# Patient Record
Sex: Male | Born: 1940
Health system: Southern US, Community
[De-identification: ages and names within clinical notes are randomized; demographics above are authoritative.]

## PROBLEM LIST (undated history)

## (undated) DIAGNOSIS — E785 Hyperlipidemia, unspecified: Secondary | ICD-10-CM

## (undated) DIAGNOSIS — R112 Nausea with vomiting, unspecified: Secondary | ICD-10-CM

## (undated) DIAGNOSIS — Z9889 Other specified postprocedural states: Secondary | ICD-10-CM

## (undated) DIAGNOSIS — T884XXA Failed or difficult intubation, initial encounter: Secondary | ICD-10-CM

## (undated) DIAGNOSIS — N189 Chronic kidney disease, unspecified: Secondary | ICD-10-CM

## (undated) DIAGNOSIS — Z8719 Personal history of other diseases of the digestive system: Secondary | ICD-10-CM

## (undated) DIAGNOSIS — C801 Malignant (primary) neoplasm, unspecified: Secondary | ICD-10-CM

## (undated) DIAGNOSIS — I519 Heart disease, unspecified: Secondary | ICD-10-CM

## (undated) DIAGNOSIS — K8010 Calculus of gallbladder with chronic cholecystitis without obstruction: Secondary | ICD-10-CM

## (undated) DIAGNOSIS — N201 Calculus of ureter: Secondary | ICD-10-CM

## (undated) DIAGNOSIS — R002 Palpitations: Secondary | ICD-10-CM

## (undated) DIAGNOSIS — K219 Gastro-esophageal reflux disease without esophagitis: Secondary | ICD-10-CM

## (undated) DIAGNOSIS — M1990 Unspecified osteoarthritis, unspecified site: Secondary | ICD-10-CM

## (undated) DIAGNOSIS — Z87442 Personal history of urinary calculi: Secondary | ICD-10-CM

## (undated) DIAGNOSIS — I4510 Unspecified right bundle-branch block: Secondary | ICD-10-CM

## (undated) HISTORY — DX: Hyperlipidemia, unspecified: E78.5

## (undated) HISTORY — DX: Unspecified osteoarthritis, unspecified site: M19.90

## (undated) HISTORY — DX: Gastro-esophageal reflux disease without esophagitis: K21.9

## (undated) HISTORY — DX: Palpitations: R00.2

## (undated) HISTORY — PX: CARDIOVASCULAR STRESS TEST: SHX262

---

## 1969-08-04 HISTORY — PX: TYMPANOPLASTY: SHX33

## 1998-10-01 ENCOUNTER — Encounter: Payer: Self-pay | Admitting: Unknown Physician Specialty

## 1998-10-01 ENCOUNTER — Ambulatory Visit (HOSPITAL_COMMUNITY): Admission: RE | Admit: 1998-10-01 | Discharge: 1998-10-01 | Payer: Self-pay | Admitting: Unknown Physician Specialty

## 1998-10-08 ENCOUNTER — Observation Stay (HOSPITAL_COMMUNITY): Admission: RE | Admit: 1998-10-08 | Discharge: 1998-10-09 | Payer: Self-pay | Admitting: Neurosurgery

## 1999-05-15 ENCOUNTER — Encounter: Admission: RE | Admit: 1999-05-15 | Discharge: 1999-05-15 | Payer: Self-pay | Admitting: Neurosurgery

## 1999-08-05 HISTORY — PX: ANTERIOR CERVICAL DECOMP/DISCECTOMY FUSION: SHX1161

## 2000-07-13 ENCOUNTER — Encounter (INDEPENDENT_AMBULATORY_CARE_PROVIDER_SITE_OTHER): Payer: Self-pay

## 2000-07-13 ENCOUNTER — Ambulatory Visit (HOSPITAL_COMMUNITY): Admission: RE | Admit: 2000-07-13 | Discharge: 2000-07-13 | Payer: Self-pay | Admitting: Gastroenterology

## 2000-12-02 ENCOUNTER — Encounter: Admission: RE | Admit: 2000-12-02 | Discharge: 2000-12-02 | Payer: Self-pay | Admitting: Neurosurgery

## 2003-12-26 ENCOUNTER — Emergency Department (HOSPITAL_COMMUNITY): Admission: EM | Admit: 2003-12-26 | Discharge: 2003-12-26 | Payer: Self-pay | Admitting: *Deleted

## 2004-05-27 ENCOUNTER — Ambulatory Visit (HOSPITAL_COMMUNITY): Admission: RE | Admit: 2004-05-27 | Discharge: 2004-05-27 | Payer: Self-pay | Admitting: Gastroenterology

## 2005-08-04 HISTORY — PX: SHOULDER ARTHROSCOPY: SHX128

## 2006-08-04 HISTORY — PX: KNEE ARTHROSCOPY: SUR90

## 2008-04-18 ENCOUNTER — Encounter: Admission: RE | Admit: 2008-04-18 | Discharge: 2008-05-25 | Payer: Self-pay | Admitting: Orthopedic Surgery

## 2008-08-07 HISTORY — PX: TRANSTHORACIC ECHOCARDIOGRAM: SHX275

## 2009-05-12 ENCOUNTER — Encounter: Admission: RE | Admit: 2009-05-12 | Discharge: 2009-05-12 | Payer: Self-pay | Admitting: Orthopedic Surgery

## 2010-12-13 ENCOUNTER — Encounter: Payer: Self-pay | Admitting: Nurse Practitioner

## 2010-12-13 DIAGNOSIS — K219 Gastro-esophageal reflux disease without esophagitis: Secondary | ICD-10-CM | POA: Insufficient documentation

## 2010-12-13 DIAGNOSIS — R002 Palpitations: Secondary | ICD-10-CM | POA: Insufficient documentation

## 2010-12-13 DIAGNOSIS — K30 Functional dyspepsia: Secondary | ICD-10-CM | POA: Insufficient documentation

## 2010-12-13 DIAGNOSIS — R0600 Dyspnea, unspecified: Secondary | ICD-10-CM | POA: Insufficient documentation

## 2010-12-16 ENCOUNTER — Encounter: Payer: Self-pay | Admitting: Nurse Practitioner

## 2010-12-16 ENCOUNTER — Ambulatory Visit (INDEPENDENT_AMBULATORY_CARE_PROVIDER_SITE_OTHER): Payer: Medicare Other | Admitting: Nurse Practitioner

## 2010-12-16 VITALS — BP 160/90 | HR 66 | Ht 71.0 in | Wt 213.2 lb

## 2010-12-16 DIAGNOSIS — I10 Essential (primary) hypertension: Secondary | ICD-10-CM

## 2010-12-16 DIAGNOSIS — R0609 Other forms of dyspnea: Secondary | ICD-10-CM

## 2010-12-16 DIAGNOSIS — R0600 Dyspnea, unspecified: Secondary | ICD-10-CM

## 2010-12-16 DIAGNOSIS — E785 Hyperlipidemia, unspecified: Secondary | ICD-10-CM | POA: Insufficient documentation

## 2010-12-16 DIAGNOSIS — I5189 Other ill-defined heart diseases: Secondary | ICD-10-CM

## 2010-12-16 DIAGNOSIS — R002 Palpitations: Secondary | ICD-10-CM

## 2010-12-16 DIAGNOSIS — I519 Heart disease, unspecified: Secondary | ICD-10-CM

## 2010-12-16 HISTORY — DX: Essential (primary) hypertension: I10

## 2010-12-16 NOTE — Assessment & Plan Note (Signed)
Seemingly resolved at the present time.

## 2010-12-16 NOTE — Assessment & Plan Note (Signed)
Blood pressure is up here today. He does not watch his salt and he had stopped checking his blood pressure at home. He is agreeable to monitoring at home and trying to cut back on the salt. I will see him back in one month to reassess.

## 2010-12-16 NOTE — Assessment & Plan Note (Signed)
LDL was 125 last June. He will have this rechecked at his physical next month. We may need to readdress the need for lipid lowering at that time. He is encouraged to work on his diet and weight.

## 2010-12-16 NOTE — Assessment & Plan Note (Signed)
He is totally asymptomatic with his Ranexa. I have encouraged him to continue the Ranexa. He is encouraged to work on his diet and weight loss.

## 2010-12-16 NOTE — Assessment & Plan Note (Signed)
Asymptomatic with the Ranexa. We will continue.

## 2010-12-16 NOTE — Patient Instructions (Addendum)
Stay on your current medicines. Monitor your blood pressure at home and try to cut back on your salt Try to exercise and focus on your weight. We will see you back in one month to relook at your blood pressure We will look at your cholesterol levels from your physical in June to reconsider cholesterol level.

## 2010-12-16 NOTE — Progress Notes (Signed)
   Max Fickle Date of Birth: [birth year]   History of Present Illness: Mr. Squier is seen today for a one year visit. He is seen for Dr. Deborah Chalk. He has had a good year overall from our standpoint. He denies chest pain. He has no shortness of breath with his Ranexa. He tried taking it only once a day but had to go to twice daily to achieve full resolution. He has no palpitations. He is mostly limited by his joints and reflux. He will be having his physical next month. It was suggested that he go on lipid lowering agents last year but he never did. His LDL was 125. He has not been checking his blood pressure at home. He does not watch his salt. He is not exercising.   Current Outpatient Prescriptions on File Prior to Visit  Medication Sig Dispense Refill  . Docusate Calcium (STOOL SOFTENER PO) Take by mouth as needed.        . hyoscyamine (LEVSIN, ANASPAZ) 0.125 MG tablet Take 0.25 mg by mouth every 6 (six) hours as needed.        . Multiple Vitamin (MULTIVITAMIN) tablet Take 1 tablet by mouth daily.        Marland Kitchen omeprazole (PRILOSEC) 20 MG capsule Take 20 mg by mouth daily.        . ranolazine (RANEXA) 500 MG 12 hr tablet Take 500 mg by mouth 2 (two) times daily.        Marland Kitchen DISCONTD: esomeprazole (NEXIUM) 40 MG capsule Take 40 mg by mouth daily before breakfast.          Allergies  Allergen Reactions  . Indocin     Past Medical History  Diagnosis Date  . Indigestion   . GERD (gastroesophageal reflux disease)   . Palpitations   . Dyspnea     Past Surgical History  Procedure Date  . Cardiolite study 12/2008    SHOWED AN EJECTION FRACTION OF 50-55% WITH LV FUNCTIONING NORMAL  . Shoulder surgery     RIGHT SHOULDER  . External ear surgery 1971    RIGHT EAR  . Knee arthroscopy 2004    RIGHT KNEE  . Lithotripsy     History  Smoking status  . Never Smoker   Smokeless tobacco  . Never Used    History  Alcohol Use No    Family History  Problem Relation Age of Onset  . Heart  failure Mother   . Aortic stenosis Mother   . Hypertension Father     Review of Systems: The review of systems is as above.  All other systems were reviewed and are negative.  Physical Exam: BP 170/94  Pulse 66  Ht 5\' 11"  (1.803 m)  Wt 213 lb 4 oz (96.73 kg)  BMI 29.74 kg/m2 Patient is very pleasant and in no acute distress. Skin is warm and dry. Color is normal.  HEENT is unremarkable. Normocephalic/atraumatic. PERRL. Sclera are nonicteric. Neck is supple. No masses. No JVD. Lungs are clear. Cardiac exam shows a regular rate and rhythm. No ectopics on exam. Abdomen is soft. Extremities are without edema. Gait and ROM are intact. No gross neurologic deficits noted.  LABORATORY DATA:  EKG shows sinus with PAC   Assessment / Plan:

## 2010-12-20 NOTE — Procedures (Signed)
[facility]  Patient:    Jake Barnett, Jake Barnett                           MRN: [id number] Proc. Date: 07/13/00 Attending:  Fayrene Fearing L. Randa Evens, M.D. CC:         Monica Becton, M.D., [employer], [place]   Procedure Report  DATE OF BIRTH:  1941-02-11.  PROCEDURE:  Colonoscopy and coagulation of polyps.  GASTROENTEROLOGIST:  Llana Aliment. Edwards, M.D.  MEDICATIONS:  Fentanyl 82.5 mcg, Versed 8 mg IV.  INDICATIONS:  Strong family history of colon cancer.  INSTRUMENT:  Adult Olympus video colonoscope.  DESCRIPTION OF PROCEDURE:  The procedure had been explained to the pat and consent obtained.  With the patient in the left lateral decubitus position, the adult video colonoscope was inserted and advanced under direct visualization.  The prep was excellent, and we were able to advance to the cecum without difficulty.  The ileocecal valve and appendiceal orifice was seen.  The scope with withdrawn, and the cecum and ascending colon were seen well upon removal.  In the mid ascending colon, two polyps, each 3 to 4 mm in diameter were seen and were cauterized and placed in a single jar. They were quite close to each other.  No other polyps were seen throughout the ascending colon, transverse colon, descending or sigmoid.  No significant diverticular disease.  The scope was withdrawn.  The rectum was free of polyps.  The patient tolerated the procedure well.  He was maintained on low-flow oxygen on pulse oximetry throughout the procedure with no obvious problem.  ASSESSMENT: Polyps in ascending colon cauterized.  PLAN:  Routine post colonoscopy instructions.  We recommend three-year repeat colonoscopy. DD:  07/13/00 TD:  07/13/00 Job: [id number]

## 2010-12-20 NOTE — Op Note (Signed)
Jake Barnett, Jake Barnett                  ACCOUNT NO.:  [id number]   MEDICAL RECORD NO.:  [id number]          PATIENT TYPE:  AMB   LOCATION:  ENDO                         FACILITY:  [facility]   PHYSICIAN:  James L. Malon Kindle., M.D.DATE OF BIRTH:  18-Jun-1941   DATE OF PROCEDURE:  05/27/2004  DATE OF DISCHARGE:                                 OPERATIVE REPORT   PROCEDURE:  Esophagogastroduodenoscopy.   MEDICATIONS GIVEN:  Fentanyl 80 mcg, Versed 7 mg IV, Xylocaine spray.   INDICATIONS FOR PROCEDURE:  Chronic esophageal reflux.   DESCRIPTION OF PROCEDURE:  The procedure was explained to the patient and  consent obtained.  With the patient in the left lateral decubitus position,  the Olympus scope was inserted and advanced.  The stomach was entered, the  pylorus identified and passed.  The duodenum including the bulb and second  portion were seen well.  There was no ulceration or inflammation.  The scope  was withdrawn back in the stomach.  The antrum and body were normal.  The  fundus and cardia were seen well on retroflex view and were normal.  The  scope was withdrawn.  The distal and proximal esophagus were endoscopically  normal.  There were no signs of Barrett's esophagus.  The Z-line was seen  about at the diaphragm.  The scope was withdrawn.  The patient tolerated the  procedure well.   ASSESSMENT:  Gastroesophageal reflux with no signs of Barrett's esophagus.   PLAN:  Will keep on current medications, give an anti-reflux sheet, and  proceed with colonoscopy at this time as planned.      JLE/MEDQ  D:  05/27/2004  T:  05/27/2004  Job:  [id number]   cc:   Gloriajean Dell. Andrey Campanile, M.D.  [address]  [place]  [place] [address]  Fax: [phone]

## 2010-12-20 NOTE — Op Note (Signed)
Jake Barnett, Jake Barnett                  ACCOUNT NO.:  [id number]   MEDICAL RECORD NO.:  [id number]          PATIENT TYPE:  AMB   LOCATION:  ENDO                         FACILITY[facility]   PHYSICIAN:  James L. Malon Kindle., M.D.DATE OF BIRTH:  1940/08/20   DATE OF PROCEDURE:  05/27/2004  DATE OF DISCHARGE:                                 OPERATIVE REPORT   PROCEDURE:  Colonoscopy.   MEDICATIONS:  Fentanyl 100 mcg, Versed 10 mg IV.   SCOPE:  Olympus pediatric colonoscope.   INDICATIONS FOR PROCEDURE:  The patient's had a previous history of polyps  and is overdue for a followup colonoscopy.  He does have a strong family  history of colon cancer as well.   DESCRIPTION OF PROCEDURE:  The procedure had been explained to the patient  and consent obtained. With the patient in the left lateral decubitus  position, the Olympus scope was inserted. The prep was excellent. We were  able to advance to the cecum without difficulty. The ileocecal valve and  appendiceal orifice were seen. The scope was withdrawn to the cecum,  ascending colon, hepatic flexure, transverse colon, splenic flexure,  descending and sigmoid colon were seen well except for scattered  diverticula.  In the sigmoid colon no polyps were seen throughout.  The  rectum was free was free of polyps. The scope was withdrawn.  The patient  tolerated the procedure well.   ASSESSMENT:  Previous history of colon polyps, V12.72.   PLAN:  Would recommend repeating colonoscopy in five years and recommend  yearly Hemoccults.      JLE/MEDQ  D:  05/27/2004  T:  05/27/2004  Job:  [id number]   cc:   Gloriajean Dell. Andrey Campanile, M.D.  [address]  [place]  [place] [address]  Fax: [phone]

## 2011-01-11 ENCOUNTER — Other Ambulatory Visit: Payer: Self-pay | Admitting: Cardiology

## 2011-01-13 ENCOUNTER — Other Ambulatory Visit: Payer: Self-pay | Admitting: *Deleted

## 2011-01-13 MED ORDER — RANOLAZINE ER 500 MG PO TB12: 500.0000 mg | ORAL_TABLET | Freq: Two times a day (BID) | ORAL | Status: DC

## 2011-01-13 NOTE — Telephone Encounter (Signed)
Fax received from pharmacy. Refill completed. Jodette Emy Angevine RN  

## 2011-01-31 ENCOUNTER — Ambulatory Visit (INDEPENDENT_AMBULATORY_CARE_PROVIDER_SITE_OTHER): Payer: Medicare Other | Admitting: Nurse Practitioner

## 2011-01-31 ENCOUNTER — Encounter: Payer: Self-pay | Admitting: Nurse Practitioner

## 2011-01-31 DIAGNOSIS — E785 Hyperlipidemia, unspecified: Secondary | ICD-10-CM

## 2011-01-31 DIAGNOSIS — I10 Essential (primary) hypertension: Secondary | ICD-10-CM

## 2011-01-31 DIAGNOSIS — R079 Chest pain, unspecified: Secondary | ICD-10-CM

## 2011-01-31 NOTE — Assessment & Plan Note (Signed)
Labs are checked by his PCP.

## 2011-01-31 NOTE — Assessment & Plan Note (Signed)
His readings show good control at home. He will continue to monitor.

## 2011-01-31 NOTE — Patient Instructions (Signed)
We are going to repeat your stress test. Continue to monitor your blood pressure at home. I will have you see Dr. Jens Som in 6 months. Call for any problems.

## 2011-01-31 NOTE — Assessment & Plan Note (Addendum)
His last stress echo was in 2010 and showed diastolic dysfunction. He has multiple risk factors. He needs to exercise but is scared to embark on a program due to that episode of chest pain. We will arrange for stress testing. We will tentatively see him back in 6 months, sooner if the stress test is abnormal. Patient is agreeable to this plan and will call if any problems develop in the interim.

## 2011-01-31 NOTE — Progress Notes (Signed)
    Jake Barnett Date of Birth: 03-05-1941   History of Present Illness: Jake Barnett is seen back today for a one month visit. He is seen for Jake Barnett. He is a former patient of Jake Barnett and a former patient of Jake Barnett. His blood pressure was up last month. He wanted a trial of exercise and salt reduction. He went home and at first says he was motivated to exercise but when he tried to get on the treadmill and walk, he felt some chest discomfort, dizziness, palpitations and got scared. He has not tried since then. His blood pressure is better at home. His cuff correlates. He does have multiple cardiovascular risk factors. He was placed on Ranexa by Jake Barnett in the remote past for his palpitations and diastolic dysfunction.   Current Outpatient Prescriptions on File Prior to Visit  Medication Sig Dispense Refill  . Multiple Vitamin (MULTIVITAMIN) tablet Take 1 tablet by mouth daily.        Marland Kitchen omeprazole (PRILOSEC) 20 MG capsule Take 20 mg by mouth daily.        . ranolazine (RANEXA) 500 MG 12 hr tablet Take 1 tablet (500 mg total) by mouth 2 (two) times daily.  60 tablet  5  . DISCONTD: Docusate Calcium (STOOL SOFTENER PO) Take by mouth as needed.        Marland Kitchen DISCONTD: hyoscyamine (LEVSIN, ANASPAZ) 0.125 MG tablet Take 0.25 mg by mouth every 6 (six) hours as needed.          Allergies  Allergen Reactions  . Indocin     Past Medical History  Diagnosis Date  . GERD (gastroesophageal reflux disease)   . Palpitations   . Dyspnea   . White coat hypertension   . Hyperlipidemia   . Diastolic dysfunction     Noted per stress echo in 2010    Past Surgical History  Procedure Date  . Cardiolite study 12/2008    SHOWED AN EJECTION FRACTION OF 50-55% WITH LV FUNCTIONING NORMAL  . Shoulder surgery     RIGHT SHOULDER  . External ear surgery 1971    RIGHT EAR  . Knee arthroscopy 2004    RIGHT KNEE  . Lithotripsy     History  Smoking status  . Never Smoker   Smokeless tobacco  .  Never Used    History  Alcohol Use No    Family History  Problem Relation Age of Onset  . Heart failure Mother   . Aortic stenosis Mother   . Hypertension Father     Review of Systems: The review of systems is positive for palpitations and an episode of chest discomfort. He has erectile dysfunction.  All other systems were reviewed and are negative.  Physical Exam: BP 138/88  Pulse 70  Wt 210 lb (95.255 kg) Blood pressure by me is 160/90. His machine is 155/93. Diary from home shows satisfactory control.  Patient is very pleasant and in no acute distress. Skin is warm and dry. Color is normal.  HEENT is unremarkable. Normocephalic/atraumatic. PERRL. Sclera are nonicteric. Neck is supple. No masses. No JVD. Lungs are clear. Cardiac exam shows a regular rate and rhythm. Abdomen is soft. Extremities are without edema. Gait and ROM are intact. No gross neurologic deficits noted.  LABORATORY DATA:   Assessment / Plan:

## 2011-02-11 ENCOUNTER — Ambulatory Visit (HOSPITAL_COMMUNITY): Payer: Medicare Other | Attending: Cardiology | Admitting: Radiology

## 2011-02-11 DIAGNOSIS — R0602 Shortness of breath: Secondary | ICD-10-CM

## 2011-02-11 DIAGNOSIS — R0789 Other chest pain: Secondary | ICD-10-CM

## 2011-02-11 DIAGNOSIS — I4949 Other premature depolarization: Secondary | ICD-10-CM

## 2011-02-11 DIAGNOSIS — R079 Chest pain, unspecified: Secondary | ICD-10-CM | POA: Insufficient documentation

## 2011-02-11 MED ORDER — TECHNETIUM TC 99M TETROFOSMIN IV KIT
33.0000 | PACK | Freq: Once | INTRAVENOUS | Status: AC | PRN
  Administered 2011-02-11: 33 via INTRAVENOUS

## 2011-02-11 MED ORDER — TECHNETIUM TC 99M TETROFOSMIN IV KIT
11.0000 | PACK | Freq: Once | INTRAVENOUS | Status: AC | PRN
  Administered 2011-02-11: 11 via INTRAVENOUS

## 2011-02-11 NOTE — Progress Notes (Signed)
[facility] SITE 3 NUCLEAR MED [address] [place] [address] [phone]  Cardiology Nuclear Med Study  Jake Barnett is a 70 y.o. male [id number] Nov 18, 1940   Nuclear Med Background Indication for Stress Test:  Evaluation for Ischemia History: 10 Stress Echo:EF=50-55%;normal with diastolic dysfunction Cardiac Risk Factors: Hypertension and Lipids(borderline)  Symptoms:  Chest Pressure(constant), Chest Tightness ( constant), Diaphoresis, Dizziness, DOE, Fatigue, Light-Headedness and Palpitations   Nuclear Pre-Procedure Caffeine/Decaff Intake:  None NPO After: 9:00pm   Lungs:  clear IV 0.9% NS with Angio Cath:  18g  IV Site: R Antecubital  IV Started by:  Stanton Kidney, EMT-P  Chest Size (in):  44 Cup Size: n/a  Height: 5\' 11"  (1.803 m)  Weight:  204 lb (92.534 kg)  BMI:  Body mass index is 28.45 kg/(m^2). Tech Comments:  NA    Nuclear Med Study 1 or 2 day study: 1 day  Stress Test Type:  Stress  Reading MD: Cassell Clement, MD  Order Authorizing Provider:  Ripley Fraise  Resting Radionuclide: Technetium 60m Tetrofosmin  Resting Radionuclide Dose: 11.0 mCi   Stress Radionuclide:  Technetium 72m Tetrofosmin  Stress Radionuclide Dose: 33.0 mCi           Stress Protocol Rest HR: 75 Stress HR: 169  Rest BP: 111/77 Stress BP: 190/79  Exercise Time (min): 9:31 METS: 10.9   Predicted Max HR: 151 bpm % Max HR: 111.92 bpm Rate Pressure Product: 21308   Dose of Adenosine (mg):  n/a Dose of Lexiscan: n/a mg  Dose of Atropine (mg): n/a Dose of Dobutamine: n/a mcg/kg/min (at max HR)  Stress Test Technologist: Cathlyn Parsons, RN  Nuclear Technologist:  Domenic Polite, CNMT     Rest Procedure:  Myocardial perfusion imaging was performed at rest 45 minutes following the intravenous administration of Technetium 35m Tetrofosmin. Rest ECG: NSR  Stress Procedure:  The patient exercised for 9:31.  The patient stopped due to fatigue and moderate dyspnea  and denied any chest pain.  There were no significant ST-T wave changes. Patient had occasional PVC's.Technetium 64m Tetrofosmin was injected at peak exercise and myocardial perfusion imaging was performed after a brief delay. Stress ECG: No significant ST segment change suggestive of ischemia.  QPS Raw Data Images:  Mild diaphragmatic attenuation.  Normal left ventricular size. Stress Images:  Normal homogeneous uptake in all areas of the myocardium. Rest Images:  Normal homogeneous uptake in all areas of the myocardium. Subtraction (SDS):  No evidence of ischemia. Transient Ischemic Dilatation (Normal <1.22):  0.88 Lung/Heart Ratio (Normal <0.45):  0.21  Quantitative Gated Spect Images QGS EDV:  97 ml QGS ESV:  33 ml QGS cine images:  NL LV Function; NL Wall Motion QGS EF: 66%  Impression Exercise Capacity:  Good exercise capacity. BP Response:  Normal blood pressure response. Clinical Symptoms:  No chest pain. ECG Impression:  No significant ST segment change suggestive of ischemia. Comparison with Prior Nuclear Study: No previous nuclear study performed  Overall Impression:  Normal stress nuclear study.   Cassell Clement

## 2011-02-12 NOTE — Progress Notes (Signed)
nuc med report routed to Dr.Crenshaw 02/12/11 Janijah Symons   

## 2011-02-14 ENCOUNTER — Telehealth: Payer: Self-pay | Admitting: Cardiology

## 2011-02-14 NOTE — Telephone Encounter (Signed)
Left message of normal myoview results on pt answering machine Jake Barnett

## 2011-02-14 NOTE — Progress Notes (Signed)
pt aware of results Jake Barnett  

## 2011-02-14 NOTE — Telephone Encounter (Signed)
Pt returning call back to [place].

## 2011-02-14 NOTE — Telephone Encounter (Signed)
Pt returning call to [facility] regarding test results. This is the second time pt has called today. Please return pt call to advise. Pt will be outside today until about 2 pm, please call pt after that time.

## 2011-05-15 ENCOUNTER — Ambulatory Visit (INDEPENDENT_AMBULATORY_CARE_PROVIDER_SITE_OTHER): Payer: Medicare Other | Admitting: General Surgery

## 2011-05-15 ENCOUNTER — Encounter (INDEPENDENT_AMBULATORY_CARE_PROVIDER_SITE_OTHER): Payer: Self-pay | Admitting: General Surgery

## 2011-05-15 VITALS — BP 134/84 | HR 64 | Temp 97.4°F | Resp 16 | Ht 71.0 in | Wt 200.5 lb

## 2011-05-15 DIAGNOSIS — N50812 Left testicular pain: Secondary | ICD-10-CM

## 2011-05-15 DIAGNOSIS — N509 Disorder of male genital organs, unspecified: Secondary | ICD-10-CM

## 2011-05-15 NOTE — Patient Instructions (Signed)
The cause of the pain in your left testicle is not clear. I do not find a hernia on exam today. This could be due to the kidney stones that you  have. It could be due to musculoskeletal problems. I do not feel an abnormality of the testicle. Since the pain only occurs once every few weeks, I do not think anything further needs to be done at this time. If the pain in your left testicle recurs or becomes more persistent you should see your urologist. If you develop a bulge in either groin you should return to see me for an exam to see if a hernia has developed. Otherwise see me as needed.

## 2011-05-15 NOTE — Progress Notes (Signed)
Chief Complaint  Patient presents with  . Other    new pt eval of LIH     HPI Jake Barnett is a 70 y.o. male.    This pleasant gentleman is from [facility]. His wife is a breast cancer patient of mine. He is referred by Dr. Vernon Barnett at [facility] for left testicular pain, possible hernia.  Approximately 10 months ago he began developing some lower abdominal discomfort from time to time. He has been seeing Jake Barnett who has tried different medications and has done a colonoscopy and upper endoscopy. Every few weeks he will have some left testicular pain and he saw Jake Barnett. He thought there might be a left inguinal hernia.  On further questioning the patient states to the left testicular pain is aching discomfort. The testicle was not tender. There has been no trauma to the testicle. This is very intermittent and he will go several weeks without it. He has known kidney stones and is followed by Dr. Retta Barnett. He does complain of urinary frequency. He has had kidney stones in the past. He does not seen blood measure and an apparently a recent urinalysis was normal and a recent PSA was normal.  He is asymptomatic today.  HPI  Past Medical History  Diagnosis Date  . GERD (gastroesophageal reflux disease)   . Palpitations   . Dyspnea   . Hyperlipidemia   . Diastolic dysfunction     Noted per stress echo in 2010  . Arthritis     Past Surgical History  Procedure Date  . Cardiolite study 12/2008    SHOWED AN EJECTION FRACTION OF 50-55% WITH LV FUNCTIONING NORMAL  . Shoulder surgery     RIGHT SHOULDER  . External ear surgery 1971    RIGHT EAR  . Knee arthroscopy 2004    RIGHT KNEE  . Lithotripsy     Family History  Problem Relation Age of Onset  . Heart failure Mother   . Aortic stenosis Mother   . Heart disease Mother   . Hypertension Father     Social History History  Substance Use Topics  . Smoking status: Never Smoker   .  Smokeless tobacco: Never Used  . Alcohol Use: No    Allergies  Allergen Reactions  . Indocin     Current Outpatient Prescriptions  Medication Sig Dispense Refill  . Multiple Vitamin (MULTIVITAMIN) tablet Take 1 tablet by mouth daily.        Marland Kitchen omeprazole (PRILOSEC) 20 MG capsule Take 20 mg by mouth daily.        . ranolazine (RANEXA) 500 MG 12 hr tablet Take 1 tablet (500 mg total) by mouth 2 (two) times daily.  60 tablet  5    Review of Systems Review of Systems  Constitutional: Negative.   HENT: Negative.   Eyes: Negative.   Respiratory: Negative.   Cardiovascular: Negative.   Gastrointestinal: Positive for abdominal pain. Negative for nausea, vomiting, diarrhea, constipation, blood in stool, abdominal distention, anal bleeding and rectal pain.  Genitourinary: Positive for frequency, difficulty urinating and testicular pain. Negative for dysuria, hematuria, flank pain, decreased urine volume, discharge, penile swelling, scrotal swelling, enuresis, genital sores and penile pain.  Musculoskeletal: Negative.   Skin: Negative.   Neurological: Negative.   Hematological: Negative.   Psychiatric/Behavioral: Negative.     Blood pressure 134/84, pulse 64, temperature 97.4 F (36.3 C), resp. rate 16, height 5\' 11"  (1.803 m), weight 200 lb 8  oz (90.946 kg).  Physical Exam Physical Exam  Constitutional: He is oriented to person, place, and time. He appears well-developed and well-nourished.  HENT:  Head: Normocephalic and atraumatic.  Nose: Nose normal.  Mouth/Throat: No oropharyngeal exudate.  Eyes: Conjunctivae are normal. Pupils are equal, round, and reactive to light. Right eye exhibits no discharge. Left eye exhibits no discharge. No scleral icterus.  Neck: Normal range of motion. Neck supple. No JVD present. No tracheal deviation present. No thyromegaly present.  Cardiovascular: Normal rate, normal heart sounds and intact distal pulses.   No murmur heard. Pulmonary/Chest:  Effort normal and breath sounds normal. No respiratory distress. He has no wheezes. He has no rales. He exhibits no tenderness.  Abdominal: Soft. Bowel sounds are normal. He exhibits no distension and no mass. There is no tenderness. There is no rebound and no guarding.  Genitourinary: Penis normal.    No penile tenderness.  Musculoskeletal: Normal range of motion. He exhibits no edema and no tenderness.  Lymphadenopathy:    He has no cervical adenopathy.  Neurological: He is alert and oriented to person, place, and time. He exhibits normal muscle tone. Coordination normal.  Skin: Skin is warm and dry. No rash noted. He is not diaphoretic. No erythema. No pallor.  Psychiatric: He has a normal mood and affect. His behavior is normal. Judgment and thought content normal.    Data Reviewed I reviewed an office note from Jake Barnett's office.  Assessment    Left testicular pain of uncertain etiology. There is no evidence for left inguinal hernia today. This may be musculoskeletal or urologic in etiology. He is asymptomatic and his pain is very intermittent. I suspect a benign etiology.  Possible early right inguinal hernia, asymptomatic, no intervention indicated.  History kidney stones.  Urinary frequency. Suspect benign prostatic hypertrophy.  Gastroesophageal reflux disease.  Hyperlipidemia  Diastolic dysfunction followed  Dr. Olga Barnett.  Status post multiple surgeries including cervical spine, right knee, and right shoulder.    Plan    The patient was reassured that I do not detect an inguinal hernia, at least at this time. He is aware that he may develop a hernia in the future.  I told him that if the left testicular pain recurred and persisted that he should probably have a urologic evaluation.  Return to see me p.r.n.       Jake Barnett M 05/15/2011, 10:14 AM

## 2011-05-26 ENCOUNTER — Ambulatory Visit (HOSPITAL_COMMUNITY): Admission: RE | Admit: 2011-05-26 | Payer: Medicare Other | Source: Ambulatory Visit | Admitting: Urology

## 2011-05-26 ENCOUNTER — Ambulatory Visit (HOSPITAL_BASED_OUTPATIENT_CLINIC_OR_DEPARTMENT_OTHER)
Admission: RE | Admit: 2011-05-26 | Discharge: 2011-05-26 | Disposition: A | Discharge status: Home / Self Care | Payer: Medicare Other | Source: Ambulatory Visit | Attending: Urology | Admitting: Urology

## 2011-05-26 DIAGNOSIS — Z01812 Encounter for preprocedural laboratory examination: Secondary | ICD-10-CM | POA: Insufficient documentation

## 2011-05-26 DIAGNOSIS — K219 Gastro-esophageal reflux disease without esophagitis: Secondary | ICD-10-CM | POA: Insufficient documentation

## 2011-05-26 DIAGNOSIS — Z01810 Encounter for preprocedural cardiovascular examination: Secondary | ICD-10-CM | POA: Insufficient documentation

## 2011-05-26 DIAGNOSIS — N201 Calculus of ureter: Secondary | ICD-10-CM | POA: Insufficient documentation

## 2011-05-26 HISTORY — PX: OTHER SURGICAL HISTORY: SHX169

## 2011-05-26 LAB — POCT I-STAT 4, (NA,K, GLUC, HGB,HCT)
Glucose, Bld: 99 mg/dL (ref 70–99)
HCT: 39 % (ref 39.0–52.0)
Hemoglobin: 13.3 g/dL (ref 13.0–17.0)
Sodium: 141 mEq/L (ref 135–145)

## 2011-05-29 NOTE — Op Note (Signed)
NAMEFINES, KIMBERLIN NO.:  [id number]  MEDICAL RECORD NO.:  [id number]  LOCATION:                                 FACILITY:  PHYSICIAN:  Valetta Fuller, MD    DATE OF BIRTH:  Dec 11, 1940  DATE OF PROCEDURE:  05/26/2011 DATE OF DISCHARGE:                              OPERATIVE REPORT   PREOPERATIVE DIAGNOSIS:  Bilateral ureteral calculi.  POSTOPERATIVE DIAGNOSIS:  Bilateral ureteral calculi.  PROCEDURE PERFORMED:  Cystoscopy, bilateral retrograde pyelograms, bilateral ureteroscopy rigid and flexible, bilateral holmium laser lithotripsy with stone basketing of fragments, and bilateral double-J stent placement.  SURGEON:  Valetta Fuller, MD.  ANESTHESIA:  General.  INDICATIONS:  Mr. Basilio is a 70 years of age.  He has a history of nephrolithiasis and recently presented with typical right renal colic. CT demonstrated a 6 mm right mid ureteral stone causing mild obstruction.  There was also bilateral nephrolithiasis.  There was also a suspicion of a distal ureteral calculus.  We felt that given the bilateral ureteral stones, the patient would be better off having endoscopic approach versus lithotripsy and the rationale for that as well as risks and benefits were discussed with him.  He presents now for the procedure.  The patient has received perioperative antibiotics and placement of PAS compression boots.  TECHNIQUE AND FINDINGS:  The patient was brought to the operating room where he had successful induction of general anesthesia.  He was placed in lithotomy position and prepped and draped in the usual manner. Appropriate surgical time-out was performed.  Cystoscopy revealed mild- to-moderate trilobar hyperplasia with a fairly prominent median bar. The bladder was otherwise, endoscopically unremarkable.  Right retrograde pyelogram was performed.  The patient had some tortuosity of his ureter and a filling defect at the junction of the mid and  distal ureter on the right, consistent with a 6 mm stone causing partial obstruction.  A guidewire was passed beyond the stone.  The ureteral orifice appeared quite snug and for that reason, the inside portion of an access sheath was used to provide one-step dilation.  The distal ureter on the right was then engaged with the 6-French rigid ureteroscope.  At that point, we performed rigid ureteroscopy up to the renal pelvis and did not immediately see the stone.  We thought that potentially it migrated proximally during retrograde pyelography.  For that reason, I placed an access sheath and then placed the digital flexible ureteroscope up through the access sheath.  An obvious stone in the renal pelvis was not encountered.  With the safety wire in place, we slowly withdrew the flexible ureteroscope and sheath and then did indeed find the stone in the mid ureter.  Holmium laser lithotriptor was then used to break the stone into multiple fragments.  The largest pieces were basket extracted.  Over the guidewire, we placed a 6-French 24 cm double-J stent with fluoroscopic as well as visual guidance.  Attention was then turned to the left side.  We did a retrograde pyelogram again with fluoroscopic interpretation and indeed.  This suspicious calcification was seen on CT, was confirmed to be in the distal ureter and appeared  to be about 5 mm in size.  A guidewire was placed beyond the stone to the renal pelvis.  Again, some dilation was necessary in the intramural ureter utilizing the inside portion of an access sheath.  The rigid ureteroscope was then engaged and an stone was encountered several centimeters above the ureterovesical junction.  Again, the stone was quite hard and dense and thought to be potentially calcium oxalate monohydrate.  Holmium laser lithotriptor was used to break the stone in the 6 to 8 pieces, which again were basket extracted.  A double-J stent, 6-French 24 cm in  length was then placed.  Fragments were removed and will be sent for stone analysis.  No obvious complications occurred and the patient was brought to recovery room in stable condition.     Valetta Fuller, MD     DSG/MEDQ  D:  05/26/2011  T:  05/27/2011  Job:  [id number]  Electronically Signed by Barron Alvine M.D. on 05/29/2011 08:50:44 AM

## 2011-06-24 ENCOUNTER — Encounter (INDEPENDENT_AMBULATORY_CARE_PROVIDER_SITE_OTHER): Payer: Self-pay | Admitting: Physician Assistant

## 2011-06-25 ENCOUNTER — Encounter (INDEPENDENT_AMBULATORY_CARE_PROVIDER_SITE_OTHER): Payer: Self-pay | Admitting: Physician Assistant

## 2011-07-05 ENCOUNTER — Other Ambulatory Visit: Payer: Self-pay | Admitting: Cardiology

## 2011-07-22 ENCOUNTER — Ambulatory Visit (INDEPENDENT_AMBULATORY_CARE_PROVIDER_SITE_OTHER): Payer: Medicare Other | Admitting: Cardiology

## 2011-07-22 ENCOUNTER — Encounter: Payer: Self-pay | Admitting: Cardiology

## 2011-07-22 DIAGNOSIS — R002 Palpitations: Secondary | ICD-10-CM

## 2011-07-22 DIAGNOSIS — I5189 Other ill-defined heart diseases: Secondary | ICD-10-CM

## 2011-07-22 DIAGNOSIS — I519 Heart disease, unspecified: Secondary | ICD-10-CM

## 2011-07-22 DIAGNOSIS — K30 Functional dyspepsia: Secondary | ICD-10-CM

## 2011-07-22 DIAGNOSIS — I10 Essential (primary) hypertension: Secondary | ICD-10-CM

## 2011-07-22 DIAGNOSIS — K3189 Other diseases of stomach and duodenum: Secondary | ICD-10-CM

## 2011-07-22 NOTE — Assessment & Plan Note (Signed)
No symptoms at present

## 2011-07-22 NOTE — Patient Instructions (Signed)
Your physician wants you to follow-up in: ONE YEAR You will receive a reminder letter in the mail two months in advance. If you don't receive a letter, please call our office to schedule the follow-up appointment.   STOP RANEXA

## 2011-07-22 NOTE — Assessment & Plan Note (Signed)
Appears to be reasonably well controlled. Ranexa would not be my first line of therapy for this problem. Will dc and if symptoms recur, add low dose beta blocker.

## 2011-07-22 NOTE — Assessment & Plan Note (Signed)
Chest pain most consistent with reflux. Previous Myoview negative. No further evaluation at this time.

## 2011-07-22 NOTE — Assessment & Plan Note (Signed)
Blood pressure is controlled on no medications. 

## 2011-07-22 NOTE — Progress Notes (Signed)
   RUE:[id number] male previously followed by Dr Deborah Chalk for fu of palpitations and chest pain. Patient is on ranexa for palpitations and dyspnea. Previous monitor in 2009 showed sinus with occasional PVC and PAC and short nonsustained run of atrial tachycardia (4 beats). Stress echocardiogram in May of 2010 showed diastolic dysfunction but no ECG changes and no wall motion abnormalities. Patient had a Myoview in July of 2012 and showed an ejection fraction of 66% and normal perfusion. Since he was last seen he has dyspnea with more extreme activities. No orthopnea, PND, pedal edema or syncope. Occasional brief skipped but no sustained palpitations. No exertional chest pain. Occasional indigestion relieved with belching.  Current Outpatient Prescriptions  Medication Sig Dispense Refill  . Multiple Vitamin (MULTIVITAMIN) tablet Take 1 tablet by mouth daily.        Marland Kitchen omeprazole (PRILOSEC) 20 MG capsule Take 20 mg by mouth daily.        Marland Kitchen RANEXA 500 MG 12 hr tablet TAKE ONE TABLET BY MOUTH TWICE A DAY  60 tablet  3     Past Medical History  Diagnosis Date  . GERD (gastroesophageal reflux disease)   . Palpitations   . Dyspnea   . Hyperlipidemia   . Diastolic dysfunction     Noted per stress echo in 2010  . Arthritis     Past Surgical History  Procedure Date  . Cardiolite study 12/2008    SHOWED AN EJECTION FRACTION OF 50-55% WITH LV FUNCTIONING NORMAL  . Shoulder surgery     RIGHT SHOULDER  . External ear surgery 1971    RIGHT EAR  . Knee arthroscopy 2004    RIGHT KNEE  . Lithotripsy     History   Social History  . Marital Status: Married    Spouse Name: N/A    Number of Children: N/A  . Years of Education: N/A   Occupational History  . Not on file.   Social History Main Topics  . Smoking status: Never Smoker   . Smokeless tobacco: Never Used  . Alcohol Use: No  . Drug Use: No  . Sexually Active: Yes   Other Topics Concern  . Not on file   Social History Narrative  .  No narrative on file    ROS: no fevers or chills, productive cough, hemoptysis, dysphasia, odynophagia, melena, hematochezia, dysuria, hematuria, rash, seizure activity, orthopnea, PND, pedal edema, claudication. Remaining systems are negative.  Physical Exam: Well-developed well-nourished in no acute distress.  Skin is warm and dry.  HEENT is normal.  Neck is supple. No thyromegaly.  Chest is clear to auscultation with normal expansion.  Cardiovascular exam is regular rate and rhythm.  Abdominal exam nontender or distended. No masses palpated. Extremities show no edema. neuro grossly intact  ECG 05/26/11 Sinus with incomplete RBBB, LAE

## 2011-12-15 DIAGNOSIS — M1710 Unilateral primary osteoarthritis, unspecified knee: Secondary | ICD-10-CM | POA: Diagnosis not present

## 2011-12-18 DIAGNOSIS — M109 Gout, unspecified: Secondary | ICD-10-CM | POA: Diagnosis not present

## 2011-12-18 DIAGNOSIS — M779 Enthesopathy, unspecified: Secondary | ICD-10-CM | POA: Diagnosis not present

## 2011-12-18 DIAGNOSIS — M79609 Pain in unspecified limb: Secondary | ICD-10-CM | POA: Diagnosis not present

## 2012-01-01 DIAGNOSIS — N200 Calculus of kidney: Secondary | ICD-10-CM | POA: Diagnosis not present

## 2012-02-19 DIAGNOSIS — K219 Gastro-esophageal reflux disease without esophagitis: Secondary | ICD-10-CM | POA: Diagnosis not present

## 2012-03-08 DIAGNOSIS — M1710 Unilateral primary osteoarthritis, unspecified knee: Secondary | ICD-10-CM | POA: Diagnosis not present

## 2012-05-17 DIAGNOSIS — L821 Other seborrheic keratosis: Secondary | ICD-10-CM | POA: Diagnosis not present

## 2012-05-17 DIAGNOSIS — L570 Actinic keratosis: Secondary | ICD-10-CM | POA: Diagnosis not present

## 2012-06-25 DIAGNOSIS — M1710 Unilateral primary osteoarthritis, unspecified knee: Secondary | ICD-10-CM | POA: Diagnosis not present

## 2012-06-25 DIAGNOSIS — M25569 Pain in unspecified knee: Secondary | ICD-10-CM | POA: Diagnosis not present

## 2012-06-28 DIAGNOSIS — Z23 Encounter for immunization: Secondary | ICD-10-CM | POA: Diagnosis not present

## 2012-07-08 DIAGNOSIS — N529 Male erectile dysfunction, unspecified: Secondary | ICD-10-CM | POA: Diagnosis not present

## 2012-07-08 DIAGNOSIS — N400 Enlarged prostate without lower urinary tract symptoms: Secondary | ICD-10-CM | POA: Diagnosis not present

## 2012-07-08 DIAGNOSIS — N200 Calculus of kidney: Secondary | ICD-10-CM | POA: Diagnosis not present

## 2012-09-02 ENCOUNTER — Ambulatory Visit (INDEPENDENT_AMBULATORY_CARE_PROVIDER_SITE_OTHER): Payer: Medicare Other | Admitting: Nurse Practitioner

## 2012-09-02 ENCOUNTER — Encounter: Payer: Self-pay | Admitting: Nurse Practitioner

## 2012-09-02 VITALS — BP 144/86 | HR 67 | Ht 71.0 in | Wt 211.4 lb

## 2012-09-02 DIAGNOSIS — R002 Palpitations: Secondary | ICD-10-CM | POA: Diagnosis not present

## 2012-09-02 DIAGNOSIS — E785 Hyperlipidemia, unspecified: Secondary | ICD-10-CM

## 2012-09-02 DIAGNOSIS — I10 Essential (primary) hypertension: Secondary | ICD-10-CM | POA: Diagnosis not present

## 2012-09-02 LAB — HEPATIC FUNCTION PANEL
AST: 20 U/L (ref 0–37)
Albumin: 4.1 g/dL (ref 3.5–5.2)
Alkaline Phosphatase: 67 U/L (ref 39–117)
Total Protein: 6.7 g/dL (ref 6.0–8.3)

## 2012-09-02 LAB — LIPID PANEL
Cholesterol: 187 mg/dL (ref 0–200)
LDL Cholesterol: 121 mg/dL — ABNORMAL HIGH (ref 0–99)
Triglycerides: 118 mg/dL (ref 0.0–149.0)

## 2012-09-02 NOTE — Progress Notes (Signed)
Patient Name: Jake Barnett Date of Encounter: 09/02/2012  Primary Care Provider:  Rudi Heap, MD Primary Cardiologist:  B. Jens Som, MD  Patient Profile  72 year old male with history of palpitations present followup.  Problem List   Past Medical History  Diagnosis Date  . GERD (gastroesophageal reflux disease)   . Palpitations     a. monitoring 2009 - occasional PAC/PVC.  Marland Kitchen Dyspnea   . Hyperlipidemia   . Diastolic dysfunction     Noted per stress echo in 2010  . Arthritis   . Chest pain     a. 12/2008 nl stress echo.;  b. 02/2011 nl myoview, EF 66%.   Past Surgical History  Procedure Date  . Cardiolite study 12/2008    SHOWED AN EJECTION FRACTION OF 50-55% WITH LV FUNCTIONING NORMAL  . Shoulder surgery     RIGHT SHOULDER  . External ear surgery 1971    RIGHT EAR  . Knee arthroscopy 2004    RIGHT KNEE  . Lithotripsy     Allergies  Allergies  Allergen Reactions  . Indomethacin     HPI  72 year old male with a problem list.  His history of palpitations with monitoring 2009 revealing occasional PACs and PVCs.  He had previously been treated with ranexa however this was discontinued in December of 2012 due to lack of indication.  The patient hasn't been seen since then but reports that he's been doing well over the past year with only occasional palpitations but nothing that is so bothersome that he did want to go back on medicine for.  He is not particularly active secondary to chronic left knee pain.  At that time he experiences no chest pain or dyspnea with usual activities.  His biggest complaint is a history of GERD and upper GI gas which is followed closely by gastroenterology.  Home Medications  Prior to Admission medications   Medication Sig Start Date End Date Taking? Authorizing Provider  Multiple Vitamin (MULTIVITAMIN) tablet Take 1 tablet by mouth daily.     Yes Historical Provider, MD  omeprazole (PRILOSEC) 20 MG capsule Take 20 mg by mouth daily.      Yes Historical Provider, MD    Review of Systems  As above, he's been doing well from a cardiac standpoint.  He does have reflux and occasional bloating.  He has occasional palpitations but feels that these are stable.  All other systems reviewed and are otherwise negative except as noted above.  Physical Exam  Blood pressure 144/86, pulse 67, height 5\' 11"  (1.803 m), weight 211 lb 6.4 oz (95.89 kg).  General: Pleasant, NAD Psych: Normal affect. Neuro: Alert and oriented X 3. Moves all extremities spontaneously. HEENT: Normal  Neck: Supple without bruits or JVD. Lungs:  Resp regular and unlabored, CTA. Heart: RRR no s3, s4, or murmurs. Abdomen: Soft, non-tender, non-distended, BS + x 4.  Extremities: No clubbing, cyanosis or edema. DP/PT/Radials 2+ and equal bilaterally.  Accessory Clinical Findings  ECG - rate is sinus rhythm, 67, no acute ST or T changes.  Her  Assessment & Plan  1.  Palpitations: Patient has occasional palpitations but is not particularly symptomatic and does not wish to initiate a beta blocker.  He is asked if we can check a thyroid profile has increased and sometimes does have one.  2.  Lipids: Patient is fasting today and has asked if we can check a fasting lipid profile hasn't been some time since he's had one.  3.  Disposition:  Followup with Dr. Jens Som in one year.    Nicolasa Ducking, NP 09/02/2012, 10:34 AM

## 2012-09-02 NOTE — Patient Instructions (Addendum)
Your physician recommends that you return for lab work in: today (Lipid, liver)  Your physician recommends that you continue on your current medications as directed. Please refer to the Current Medication list given to you today.  Your physician wants you to follow-up in: 1 year.   You will receive a reminder letter in the mail two months in advance. If you don't receive a letter, please call our office to schedule the follow-up appointment.

## 2012-09-03 ENCOUNTER — Encounter: Payer: Self-pay | Admitting: *Deleted

## 2012-09-10 ENCOUNTER — Telehealth: Payer: Self-pay | Admitting: Cardiology

## 2012-09-10 NOTE — Telephone Encounter (Signed)
Message placed in error.  Please disregard and delete.

## 2012-09-10 NOTE — Telephone Encounter (Signed)
New Problem:     Patient called in needing updates for all of her medications to last her until her next OV with Dr. Daleen Squibb.  Please call back if you have any questions.

## 2012-11-18 DIAGNOSIS — M1710 Unilateral primary osteoarthritis, unspecified knee: Secondary | ICD-10-CM | POA: Diagnosis not present

## 2012-12-08 DIAGNOSIS — M1710 Unilateral primary osteoarthritis, unspecified knee: Secondary | ICD-10-CM | POA: Diagnosis not present

## 2012-12-15 DIAGNOSIS — M1710 Unilateral primary osteoarthritis, unspecified knee: Secondary | ICD-10-CM | POA: Diagnosis not present

## 2012-12-22 DIAGNOSIS — M1710 Unilateral primary osteoarthritis, unspecified knee: Secondary | ICD-10-CM | POA: Diagnosis not present

## 2013-01-06 DIAGNOSIS — N200 Calculus of kidney: Secondary | ICD-10-CM | POA: Diagnosis not present

## 2013-01-06 DIAGNOSIS — N400 Enlarged prostate without lower urinary tract symptoms: Secondary | ICD-10-CM | POA: Diagnosis not present

## 2013-02-02 DIAGNOSIS — M1710 Unilateral primary osteoarthritis, unspecified knee: Secondary | ICD-10-CM | POA: Diagnosis not present

## 2013-02-21 DIAGNOSIS — K219 Gastro-esophageal reflux disease without esophagitis: Secondary | ICD-10-CM | POA: Diagnosis not present

## 2013-03-08 ENCOUNTER — Other Ambulatory Visit: Payer: Self-pay | Admitting: *Deleted

## 2013-03-08 DIAGNOSIS — Z01818 Encounter for other preprocedural examination: Secondary | ICD-10-CM

## 2013-03-21 ENCOUNTER — Telehealth: Payer: Self-pay | Admitting: Cardiology

## 2013-03-21 NOTE — Telephone Encounter (Signed)
Spoke with pt, aware he has an appt to get clearance 04-05-13 with lori gerhardt np. Pt voiced understanding

## 2013-03-21 NOTE — Telephone Encounter (Signed)
Pt calling re surgical clearance status

## 2013-03-23 ENCOUNTER — Encounter: Payer: Self-pay | Admitting: *Deleted

## 2013-03-29 DIAGNOSIS — M1710 Unilateral primary osteoarthritis, unspecified knee: Secondary | ICD-10-CM | POA: Diagnosis not present

## 2013-04-05 ENCOUNTER — Other Ambulatory Visit: Payer: Self-pay | Admitting: Orthopedic Surgery

## 2013-04-05 ENCOUNTER — Ambulatory Visit (INDEPENDENT_AMBULATORY_CARE_PROVIDER_SITE_OTHER): Payer: Medicare Other | Admitting: Nurse Practitioner

## 2013-04-05 ENCOUNTER — Encounter: Payer: Self-pay | Admitting: Nurse Practitioner

## 2013-04-05 VITALS — BP 142/76 | HR 68 | Ht 71.0 in | Wt 210.8 lb

## 2013-04-05 DIAGNOSIS — R079 Chest pain, unspecified: Secondary | ICD-10-CM | POA: Diagnosis not present

## 2013-04-05 DIAGNOSIS — Z01818 Encounter for other preprocedural examination: Secondary | ICD-10-CM

## 2013-04-05 NOTE — Progress Notes (Signed)
Jake Barnett Date of Birth: Jan 28, 1941 Medical Record #[id number]  History of Present Illness: Jake Barnett is seen back today for a pre op visit. Seen for Dr. Jens Som. Former patient of Dr. Ronnald Nian. Has palpitations, GERD, dyspnea, HLD, diastolic dysfunction noted per stress echo back in 2010, OA and a normal Myoview back in July of 2010 with EF 66% and no ischemia.   Last seen here in January for palpitations. Has had remote monitoring showing occasional PACs and PVCs. Previously treated with Ranexa but stopped due to lack of indication - but it did improve his dyspnea.   Comes in today. Here alone. Doing ok except for knee pain - no longer able to golf and not as active as he would like to be. No chest pain. Not short of breath. Not dizzy or lightheaded. No passing out spells. Not really having any palpitations. He is planning on left TKR in 2 weeks. BP diary from home shows excellent control.    Current Outpatient Prescriptions  Medication Sig Dispense Refill  . docusate sodium (COLACE) 50 MG capsule Take by mouth as needed for constipation.      . naproxen sodium (ANAPROX) 220 MG tablet Take 220 mg by mouth as needed.      . ranitidine (ZANTAC) 150 MG tablet Take 150 mg by mouth as needed for heartburn.       No current facility-administered medications for this visit.    Allergies  Allergen Reactions  . Indomethacin     Past Medical History  Diagnosis Date  . GERD (gastroesophageal reflux disease)   . Palpitations     a. monitoring 2009 - occasional PAC/PVC.  Marland Kitchen Dyspnea   . Hyperlipidemia   . Diastolic dysfunction     Noted per stress echo in 2010  . Arthritis   . Chest pain     a. 12/2008 nl stress echo.;  b. 02/2011 nl myoview, EF 66%.    Past Surgical History  Procedure Laterality Date  . Cardiolite study  12/2008    SHOWED AN EJECTION FRACTION OF 50-55% WITH LV FUNCTIONING NORMAL  . Shoulder surgery      RIGHT SHOULDER  . External ear surgery  1971    RIGHT EAR  .  Knee arthroscopy  2004    RIGHT KNEE  . Lithotripsy      History  Smoking status  . Never Smoker   Smokeless tobacco  . Never Used    History  Alcohol Use No    Family History  Problem Relation Age of Onset  . Heart failure Mother   . Aortic stenosis Mother   . Heart disease Mother   . Hypertension Father     Review of Systems: The review of systems is per the HPI.  All other systems were reviewed and are negative.  Physical Exam: BP 142/76  Pulse 68  Ht 5\' 11"  (1.803 m)  Wt 210 lb 12.8 oz (95.618 kg)  BMI 29.41 kg/m2 Patient is very pleasant and in no acute distress. Skin is warm and dry. Color is normal.  HEENT is unremarkable. Normocephalic/atraumatic. PERRL. Sclera are nonicteric. Neck is supple. No masses. No JVD. Lungs are clear. Cardiac exam shows a regular rate and rhythm. Abdomen is soft. Extremities are without edema. Gait and ROM are intact. No gross neurologic deficits noted.  LABORATORY DATA: EKG shows sinus with incomplete RBBB.   Lab Results  Component Value Date   HGB 13.3 05/26/2011   HCT 39.0 05/26/2011  GLUCOSE 99 05/26/2011   CHOL 187 09/02/2012   TRIG 118.0 09/02/2012   HDL 42.50 09/02/2012   LDLCALC 121* 09/02/2012   ALT 13 09/02/2012   AST 20 09/02/2012   NA 141 05/26/2011   K 4.0 05/26/2011   Assessment / Plan: 1. Pre op clearance - currently totally asymptomatic. Normal Myoview from 2012. I think he is an acceptable candidate for his knee surgery. Will be available if problems arise.   2. HTN - has excellent control at home.  3. Diastolic dysfunction - asymptomatic.   Will see him back at his regular time in January with Dr. Jens Som.   Patient is agreeable to this plan and will call if any problems develop in the interim.   Rosalio Macadamia, RN, ANP-C [facility] [address] [place], [place]  [address]

## 2013-04-05 NOTE — Patient Instructions (Addendum)
I think you are an acceptable candidate for your knee replacement  We will see you back as planned in January  Call the [facility] office at [phone] if you have any questions, problems or concerns.

## 2013-04-06 ENCOUNTER — Encounter (HOSPITAL_COMMUNITY): Payer: Self-pay | Admitting: Pharmacy Technician

## 2013-04-11 ENCOUNTER — Other Ambulatory Visit (HOSPITAL_COMMUNITY): Payer: Self-pay | Admitting: *Deleted

## 2013-04-12 ENCOUNTER — Encounter (HOSPITAL_COMMUNITY): Payer: Self-pay

## 2013-04-12 ENCOUNTER — Encounter (HOSPITAL_COMMUNITY)
Admission: RE | Admit: 2013-04-12 | Discharge: 2013-04-12 | Disposition: A | Discharge status: Home / Self Care | Payer: Medicare Other | Source: Ambulatory Visit | Attending: Orthopedic Surgery | Admitting: Orthopedic Surgery

## 2013-04-12 DIAGNOSIS — Z01812 Encounter for preprocedural laboratory examination: Secondary | ICD-10-CM | POA: Insufficient documentation

## 2013-04-12 DIAGNOSIS — M1710 Unilateral primary osteoarthritis, unspecified knee: Secondary | ICD-10-CM | POA: Insufficient documentation

## 2013-04-12 HISTORY — DX: Personal history of urinary calculi: Z87.442

## 2013-04-12 HISTORY — DX: Personal history of other diseases of the digestive system: Z87.19

## 2013-04-12 LAB — COMPREHENSIVE METABOLIC PANEL
ALT: 8 U/L (ref 0–53)
Albumin: 3.8 g/dL (ref 3.5–5.2)
Alkaline Phosphatase: 82 U/L (ref 39–117)
BUN: 15 mg/dL (ref 6–23)
Chloride: 103 mEq/L (ref 96–112)
Glucose, Bld: 91 mg/dL (ref 70–99)
Potassium: 4.3 mEq/L (ref 3.5–5.1)
Sodium: 139 mEq/L (ref 135–145)
Total Bilirubin: 1 mg/dL (ref 0.3–1.2)

## 2013-04-12 LAB — URINALYSIS, ROUTINE W REFLEX MICROSCOPIC
Bilirubin Urine: NEGATIVE
Glucose, UA: NEGATIVE mg/dL
Hgb urine dipstick: NEGATIVE
Specific Gravity, Urine: 1.02 (ref 1.005–1.030)
Urobilinogen, UA: 0.2 mg/dL (ref 0.0–1.0)

## 2013-04-12 LAB — CBC
HCT: 45.4 % (ref 39.0–52.0)
Hemoglobin: 15.7 g/dL (ref 13.0–17.0)
RDW: 13.4 % (ref 11.5–15.5)
WBC: 7.3 10*3/uL (ref 4.0–10.5)

## 2013-04-12 LAB — PROTIME-INR: Prothrombin Time: 12.5 seconds (ref 11.6–15.2)

## 2013-04-12 LAB — APTT: aPTT: 26 seconds (ref 24–37)

## 2013-04-12 NOTE — Patient Instructions (Signed)
20      Your procedure is scheduled on:  Monday 04/18/2013  Report to [facility] Stay Center at  0940 AM.  Call this number if you have problems the night before or morning of surgery: [phone]   Remember:             IF YOU USE CPAP,BRING MASK AND TUBING AM OF SURGERY!   Do not eat food or drink liquids AFTER MIDNIGHT!  Take these medicines the morning of surgery with A SIP OF WATER: NONE   Do not bring valuables to the hospital. [facility] IS NOT RESPONSIBLE FOR ANY BELONGINGS OR VALUABLES BROUGHT TO HOSPITAL.  Marland Kitchen  Leave suitcase in the car. After surgery it may be brought to your room.  For patients admitted to the hospital, checkout time is 11:00 AM the day of              Discharge.    DO NOT WEAR JEWELRY , MAKE-UP, LOTIONS,POWDERS,PERFUMES!             WOMEN -DO NOT SHAVE LEGS OR UNDERARMS 12 HRS. BEFORE SURGERY!               MEN MAY SHAVE AS USUAL!             CONTACTS,DENTURES OR BRIDGEWORK, FALSE EYELASHES MAY NOT BE WORN INTO SURGERY!                                           Patients discharged the day of surgery will not be allowed to drive home. If going home the same day of surgery, must have someone stay with you first 24 hrs.at home and arrange for someone to drive you home from the Hospital.                          YOUR DRIVER IS: spouse-Saundra   Special Instructions:             Please read over the following fact sheets that you were given:             1. [place] PREPARING FOR SURGERY SHEET              2.INCENTIVE SPIROMETRY                    3. MRSA INFORMATION                                    Telford Nab.Jilleen Essner,RN,BSN     [phone]                FAILURE TO FOLLOW THESE INSTRUCTIONS MAY RESULT IN  CANCELLATION OF YOUR SURGERY!               Patient Signature:___________________________

## 2013-04-17 ENCOUNTER — Other Ambulatory Visit: Payer: Self-pay | Admitting: Orthopedic Surgery

## 2013-04-17 NOTE — H&P (Signed)
Jake Barnett  DOB: September 11, 1940 Married / Language: [place] / Race: White Male  Date of Admission:  04/18/2013  Chief Complaint:  Left Knee Pain  History of Present Illness The patient is a 72 year old male who comes in for a preoperative History and Physical. The patient is scheduled for a left total knee arthroplasty to be performed by Dr. Gus Rankin. Aluisio, MD at [facility] on 04/18/2013. The patient is a 72 year old male who presents for a recheck of Follow-up Knee. The patient is being followed for their left knee pain and osteoarthritis. They are now week(s) out from synvisc series (helped very little). Symptoms reported today include: pain (on medial side of knee with any weight. Pain increases with increase in activity.), pain at night, swelling and stiffness (after sitting), while the patient does not report symptoms of: instability. The patient feels that they are doing well and report their pain level to be mild to moderate and 3 / 10. The following medication has been used for pain control: none. The patient has not gotten any relief of their symptoms with viscosupplementation.  Unfortunately his left knee is getting progressively worse. Cortisone and visco supplements have not provided any benefit. He continues to have pain throughout the day as well as sometimes at night. It is hard for him to get into a comfortable position. He frequently has to sleep with a pillow between his legs. The knee is now limiting what he can and can not do. He would like to be more active but the knee is preventing him from doing so.  He is now ready for surgery. They have been treated conservatively in the past for the above stated problem and despite conservative measures, they continue to have progressive pain and severe functional limitations and dysfunction. They have failed non-operative management including home exercise, medications. It is felt that they would benefit from undergoing  total joint replacement. Risks and benefits of the procedure have been discussed with the patient and they elect to proceed with surgery. There are no active contraindications to surgery such as ongoing infection or rapidly progressive neurological disease.  Problem List Primary osteoarthritis of both knees (715.16)   Allergies Indocin *ANALGESICS - ANTI-INFLAMMATORY*   Family History Diabetes Mellitus. grandmother fathers side Rheumatoid Arthritis. mother and father Cancer. grandfather fathers side Hypertension. mother and father Congestive Heart Failure. mother   Social History Living situation. live with spouse Current work status. retired Marital status. married Children. 1 Pain Contract. no Tobacco / smoke exposure. no Alcohol use. never consumed alcohol Illicit drug use. no Tobacco use. never smoker Number of flights of stairs before winded. 4-5 Drug/Alcohol Rehab (Previously). no Drug/Alcohol Rehab (Currently). no Exercise. Exercises rarely; does running / walking   Past Surgical History Neck Disc Surgery Spinal Fusion. neck Colon Polyp Removal - Colonoscopy Arthroscopy of Knee. right Arthroscopy of Shoulder. right   Medical History Skin Cancer Gastroesophageal Reflux Disease Kidney Stone Gout Ulcer disease Hiatal Hernia Hemorrhoids Scarlet Fever Measles Mumps   Review of Systems General:Present- Fatigue. Not Present- Chills, Fever, Night Sweats, Weight Gain, Weight Loss and Memory Loss. Skin:Not Present- Hives, Itching, Rash, Eczema and Lesions. HEENT:Not Present- Tinnitus, Headache, Double Vision, Visual Loss, Hearing Loss and Dentures. Respiratory:Not Present- Shortness of breath with exertion, Shortness of breath at rest, Allergies, Coughing up blood and Chronic Cough. Cardiovascular:Not Present- Chest Pain, Racing/skipping heartbeats, Difficulty Breathing Lying Down, Murmur, Swelling and  Palpitations. Gastrointestinal:Present- Heartburn. Not Present- Bloody Stool, Abdominal Pain, Vomiting,  Nausea, Constipation, Diarrhea, Difficulty Swallowing, Jaundice and Loss of appetitie. Male Genitourinary:Not Present- Urinary frequency, Blood in Urine, Weak urinary stream, Discharge, Flank Pain, Incontinence, Painful Urination, Urgency, Urinary Retention and Urinating at Night. Musculoskeletal:Present- Joint Pain. Not Present- Muscle Weakness, Muscle Pain, Joint Swelling, Back Pain, Morning Stiffness and Spasms. Neurological:Not Present- Tremor, Dizziness, Blackout spells, Paralysis, Difficulty with balance and Weakness. Psychiatric:Not Present- Insomnia.   Vitals Weight: 210 lb Height: 71 in Weight was reported by patient. Height was reported by patient. Body Surface Area: 2.18 m Body Mass Index: 29.29 kg/m Pulse: 64 (Regular) Resp.: 14 (Unlabored) BP: 138/78 (Sitting, Right Arm, Standard)    Physical Exam The physical exam findings are as follows:   General Mental Status - Alert, cooperative and good historian. General Appearance- pleasant. Not in acute distress. Orientation- Oriented X3. Build & Nutrition- Well nourished and Well developed.   Head and Neck Head- normocephalic, atraumatic . Neck Global Assessment- supple. no bruit auscultated on the right and no bruit auscultated on the left.   Eye Vision- Wears corrective lenses. Pupil- Bilateral- Regular and Round. Motion- Bilateral- EOMI.   Chest and Lung Exam Auscultation: Breath sounds:- clear at anterior chest wall and - clear at posterior chest wall. Adventitious sounds:- No Adventitious sounds.   Cardiovascular Auscultation:Rhythm- Regular rate and rhythm. Heart Sounds- S1 WNL and S2 WNL. Murmurs & Other Heart Sounds:Auscultation of the heart reveals - No Murmurs.   Abdomen Palpation/Percussion:Tenderness- Abdomen is non-tender to palpation. Rigidity  (guarding)- Abdomen is soft. Auscultation:Auscultation of the abdomen reveals - Bowel sounds normal.   Male Genitourinary Not done, not pertinent to present illness  Musculoskeletal On exam he is alert and oriented in no apparent distress. His hips show normal range of motion, no discomfort. His left knee shows slight varus deformity. There is no effusion. Range is about 10 to 125. There is marked crepitus on range of motion. He is tender medial greater than lateral with no instability.  RADIOGRAPHS: Review of previous x-rays show bone on bone arthritis in the medial compartment and patellofemoral compartment of the left knee.  Assessment & Plan Primary osteoarthritis of both knees (715.16)  Note: Plan is for a Left Total Knee Replacement by Dr. Lequita Halt.  Plan is to go home with this wife.  PCP - [facility] - Dr. Jens Som  The patient does not have any contraindications and will recieve TXA (tranexamic acid) prior to surgery.  Time spent ~ 35 minutes  Signed electronically by Lauraine Rinne, III PA-C

## 2013-04-18 ENCOUNTER — Encounter (HOSPITAL_COMMUNITY): Payer: Self-pay | Admitting: Anesthesiology

## 2013-04-18 ENCOUNTER — Inpatient Hospital Stay (HOSPITAL_COMMUNITY): Payer: Medicare Other | Admitting: Anesthesiology

## 2013-04-18 ENCOUNTER — Encounter (HOSPITAL_COMMUNITY)
Admission: RE | Disposition: A | Discharge status: Home Health | Payer: Self-pay | Source: Ambulatory Visit | Attending: Orthopedic Surgery

## 2013-04-18 ENCOUNTER — Encounter (HOSPITAL_COMMUNITY): Payer: Self-pay | Admitting: *Deleted

## 2013-04-18 ENCOUNTER — Inpatient Hospital Stay (HOSPITAL_COMMUNITY)
Admission: RE | Admit: 2013-04-18 | Discharge: 2013-04-20 | DRG: 470 | Disposition: A | Discharge status: Home Health | Payer: Medicare Other | Source: Ambulatory Visit | Attending: Orthopedic Surgery | Admitting: Orthopedic Surgery

## 2013-04-18 DIAGNOSIS — Z85828 Personal history of other malignant neoplasm of skin: Secondary | ICD-10-CM

## 2013-04-18 DIAGNOSIS — M25569 Pain in unspecified knee: Secondary | ICD-10-CM | POA: Diagnosis not present

## 2013-04-18 DIAGNOSIS — M1710 Unilateral primary osteoarthritis, unspecified knee: Secondary | ICD-10-CM | POA: Diagnosis not present

## 2013-04-18 DIAGNOSIS — D62 Acute posthemorrhagic anemia: Secondary | ICD-10-CM

## 2013-04-18 DIAGNOSIS — K219 Gastro-esophageal reflux disease without esophagitis: Secondary | ICD-10-CM

## 2013-04-18 DIAGNOSIS — Z8261 Family history of arthritis: Secondary | ICD-10-CM | POA: Diagnosis not present

## 2013-04-18 DIAGNOSIS — Z981 Arthrodesis status: Secondary | ICD-10-CM

## 2013-04-18 DIAGNOSIS — M109 Gout, unspecified: Secondary | ICD-10-CM | POA: Diagnosis not present

## 2013-04-18 DIAGNOSIS — Z96652 Presence of left artificial knee joint: Secondary | ICD-10-CM

## 2013-04-18 DIAGNOSIS — M179 Osteoarthritis of knee, unspecified: Secondary | ICD-10-CM

## 2013-04-18 DIAGNOSIS — Z01812 Encounter for preprocedural laboratory examination: Secondary | ICD-10-CM

## 2013-04-18 DIAGNOSIS — IMO0002 Reserved for concepts with insufficient information to code with codable children: Secondary | ICD-10-CM | POA: Diagnosis not present

## 2013-04-18 HISTORY — PX: TOTAL KNEE ARTHROPLASTY: SHX125

## 2013-04-18 LAB — TYPE AND SCREEN
ABO/RH(D): O POS
Antibody Screen: NEGATIVE

## 2013-04-18 LAB — ABO/RH: ABO/RH(D): O POS

## 2013-04-18 SURGERY — ARTHROPLASTY, KNEE, TOTAL
Anesthesia: Spinal | Site: Knee | Laterality: Left | Wound class: Clean

## 2013-04-18 MED ORDER — FAMOTIDINE 20 MG PO TABS
20.0000 mg | ORAL_TABLET | Freq: Every day | ORAL | Status: DC | PRN
  Administered 2013-04-18: 20 mg via ORAL
  Filled 2013-04-18 (×2): qty 1

## 2013-04-18 MED ORDER — ONDANSETRON HCL 4 MG/2ML IJ SOLN
INTRAMUSCULAR | Status: DC | PRN
  Administered 2013-04-18: 4 mg via INTRAVENOUS

## 2013-04-18 MED ORDER — LACTATED RINGERS IV SOLN
INTRAVENOUS | Status: DC
  Administered 2013-04-18: 1000 mL via INTRAVENOUS
  Administered 2013-04-18: 13:00:00 via INTRAVENOUS

## 2013-04-18 MED ORDER — METHOCARBAMOL 100 MG/ML IJ SOLN
500.0000 mg | Freq: Four times a day (QID) | INTRAMUSCULAR | Status: DC | PRN
  Administered 2013-04-18: 16:00:00 500 mg via INTRAVENOUS
  Filled 2013-04-18: qty 5

## 2013-04-18 MED ORDER — BUPIVACAINE IN DEXTROSE 0.75-8.25 % IT SOLN
INTRATHECAL | Status: DC | PRN
  Administered 2013-04-18: 15 mg via INTRATHECAL

## 2013-04-18 MED ORDER — ACETAMINOPHEN 500 MG PO TABS
1000.0000 mg | ORAL_TABLET | Freq: Four times a day (QID) | ORAL | Status: AC
  Administered 2013-04-18: 1000 mg via ORAL
  Filled 2013-04-18: qty 2

## 2013-04-18 MED ORDER — DOCUSATE SODIUM 100 MG PO CAPS
100.0000 mg | ORAL_CAPSULE | Freq: Two times a day (BID) | ORAL | Status: DC
  Administered 2013-04-18 – 2013-04-20 (×3): 100 mg via ORAL

## 2013-04-18 MED ORDER — CEFAZOLIN SODIUM-DEXTROSE 2-3 GM-% IV SOLR
INTRAVENOUS | Status: AC
  Filled 2013-04-18: qty 50

## 2013-04-18 MED ORDER — MEPERIDINE HCL 50 MG/ML IJ SOLN: 6.2500 mg | INTRAMUSCULAR | Status: DC | PRN

## 2013-04-18 MED ORDER — POLYETHYLENE GLYCOL 3350 17 G PO PACK: 17.0000 g | PACK | Freq: Every day | ORAL | Status: DC | PRN

## 2013-04-18 MED ORDER — FENTANYL CITRATE 0.05 MG/ML IJ SOLN
INTRAMUSCULAR | Status: AC
  Filled 2013-04-18: qty 2

## 2013-04-18 MED ORDER — CEFAZOLIN SODIUM-DEXTROSE 2-3 GM-% IV SOLR
2.0000 g | INTRAVENOUS | Status: AC
  Administered 2013-04-18: 2 g via INTRAVENOUS

## 2013-04-18 MED ORDER — RIVAROXABAN 10 MG PO TABS
10.0000 mg | ORAL_TABLET | Freq: Every day | ORAL | Status: DC
  Administered 2013-04-19 – 2013-04-20 (×2): 10 mg via ORAL
  Filled 2013-04-18 (×4): qty 1

## 2013-04-18 MED ORDER — DEXAMETHASONE 6 MG PO TABS
10.0000 mg | ORAL_TABLET | Freq: Every day | ORAL | Status: AC
  Administered 2013-04-19: 09:00:00 10 mg via ORAL
  Filled 2013-04-18: qty 1

## 2013-04-18 MED ORDER — KETOROLAC TROMETHAMINE 15 MG/ML IJ SOLN: 7.5000 mg | Freq: Four times a day (QID) | INTRAMUSCULAR | Status: AC | PRN

## 2013-04-18 MED ORDER — METOCLOPRAMIDE HCL 5 MG/ML IJ SOLN
5.0000 mg | Freq: Three times a day (TID) | INTRAMUSCULAR | Status: DC | PRN
  Administered 2013-04-19: 08:00:00 10 mg via INTRAVENOUS
  Filled 2013-04-18: qty 2

## 2013-04-18 MED ORDER — MORPHINE SULFATE 2 MG/ML IJ SOLN
1.0000 mg | INTRAMUSCULAR | Status: DC | PRN
  Administered 2013-04-18: 16:00:00 1 mg via INTRAVENOUS
  Administered 2013-04-19: 2 mg via INTRAVENOUS
  Filled 2013-04-18 (×2): qty 1

## 2013-04-18 MED ORDER — BUPIVACAINE LIPOSOME 1.3 % IJ SUSP
INTRAMUSCULAR | Status: DC | PRN
  Administered 2013-04-18: 20 mL

## 2013-04-18 MED ORDER — METHOCARBAMOL 500 MG PO TABS
500.0000 mg | ORAL_TABLET | Freq: Four times a day (QID) | ORAL | Status: DC | PRN
  Administered 2013-04-18 – 2013-04-19 (×4): 500 mg via ORAL
  Filled 2013-04-18 (×4): qty 1

## 2013-04-18 MED ORDER — BISACODYL 10 MG RE SUPP: 10.0000 mg | Freq: Every day | RECTAL | Status: DC | PRN

## 2013-04-18 MED ORDER — DEXAMETHASONE SODIUM PHOSPHATE 10 MG/ML IJ SOLN
10.0000 mg | Freq: Once | INTRAMUSCULAR | Status: AC
  Administered 2013-04-18: 10 mg via INTRAVENOUS

## 2013-04-18 MED ORDER — FLEET ENEMA 7-19 GM/118ML RE ENEM: 1.0000 | ENEMA | Freq: Once | RECTAL | Status: AC | PRN

## 2013-04-18 MED ORDER — CEFAZOLIN SODIUM-DEXTROSE 2-3 GM-% IV SOLR
2.0000 g | Freq: Four times a day (QID) | INTRAVENOUS | Status: AC
  Administered 2013-04-18 – 2013-04-19 (×2): 2 g via INTRAVENOUS
  Filled 2013-04-18 (×3): qty 50

## 2013-04-18 MED ORDER — SODIUM CHLORIDE 0.9 % IJ SOLN
INTRAMUSCULAR | Status: DC | PRN
  Administered 2013-04-18: 30 mL via INTRAVENOUS

## 2013-04-18 MED ORDER — PHENOL 1.4 % MT LIQD
1.0000 | OROMUCOSAL | Status: DC | PRN
  Filled 2013-04-18: qty 177

## 2013-04-18 MED ORDER — PROMETHAZINE HCL 25 MG/ML IJ SOLN: 6.2500 mg | INTRAMUSCULAR | Status: DC | PRN

## 2013-04-18 MED ORDER — PROPOFOL INFUSION 10 MG/ML OPTIME
INTRAVENOUS | Status: DC | PRN
  Administered 2013-04-18: 60 ug/kg/min via INTRAVENOUS

## 2013-04-18 MED ORDER — TRANEXAMIC ACID 100 MG/ML IV SOLN
1000.0000 mg | INTRAVENOUS | Status: AC
  Administered 2013-04-18: 1000 mg via INTRAVENOUS
  Filled 2013-04-18: qty 10

## 2013-04-18 MED ORDER — 0.9 % SODIUM CHLORIDE (POUR BTL) OPTIME
TOPICAL | Status: DC | PRN
  Administered 2013-04-18: 1000 mL

## 2013-04-18 MED ORDER — ONDANSETRON HCL 4 MG PO TABS: 4.0000 mg | ORAL_TABLET | Freq: Four times a day (QID) | ORAL | Status: DC | PRN

## 2013-04-18 MED ORDER — SODIUM CHLORIDE 0.9 % IV SOLN: INTRAVENOUS | Status: DC

## 2013-04-18 MED ORDER — OXYCODONE HCL 5 MG PO TABS
5.0000 mg | ORAL_TABLET | ORAL | Status: DC | PRN
  Administered 2013-04-18: 10 mg via ORAL
  Administered 2013-04-18: 5 mg via ORAL
  Administered 2013-04-19: 10 mg via ORAL
  Administered 2013-04-19 – 2013-04-20 (×4): 5 mg via ORAL
  Filled 2013-04-18: qty 1
  Filled 2013-04-18: qty 2
  Filled 2013-04-18 (×2): qty 1
  Filled 2013-04-18: qty 2
  Filled 2013-04-18 (×2): qty 1

## 2013-04-18 MED ORDER — SODIUM CHLORIDE 0.9 % IJ SOLN
INTRAMUSCULAR | Status: AC
  Filled 2013-04-18: qty 50

## 2013-04-18 MED ORDER — DEXAMETHASONE SODIUM PHOSPHATE 10 MG/ML IJ SOLN
10.0000 mg | Freq: Every day | INTRAMUSCULAR | Status: AC
  Filled 2013-04-18: qty 1

## 2013-04-18 MED ORDER — SODIUM CHLORIDE 0.9 % IV SOLN
INTRAVENOUS | Status: DC
  Administered 2013-04-18: 18:00:00 via INTRAVENOUS

## 2013-04-18 MED ORDER — FENTANYL CITRATE 0.05 MG/ML IJ SOLN
25.0000 ug | INTRAMUSCULAR | Status: DC | PRN
  Administered 2013-04-18 (×4): 25 ug via INTRAVENOUS

## 2013-04-18 MED ORDER — MENTHOL 3 MG MT LOZG
1.0000 | LOZENGE | OROMUCOSAL | Status: DC | PRN
  Filled 2013-04-18: qty 9

## 2013-04-18 MED ORDER — ONDANSETRON HCL 4 MG/2ML IJ SOLN
4.0000 mg | Freq: Four times a day (QID) | INTRAMUSCULAR | Status: DC | PRN
  Administered 2013-04-20: 08:00:00 4 mg via INTRAVENOUS
  Filled 2013-04-18: qty 2

## 2013-04-18 MED ORDER — ACETAMINOPHEN 500 MG PO TABS
1000.0000 mg | ORAL_TABLET | Freq: Once | ORAL | Status: AC
  Administered 2013-04-18: 1000 mg via ORAL
  Filled 2013-04-18: qty 2

## 2013-04-18 MED ORDER — METOCLOPRAMIDE HCL 10 MG PO TABS: 5.0000 mg | ORAL_TABLET | Freq: Three times a day (TID) | ORAL | Status: DC | PRN

## 2013-04-18 MED ORDER — DIPHENHYDRAMINE HCL 12.5 MG/5ML PO ELIX: 12.5000 mg | ORAL_SOLUTION | ORAL | Status: DC | PRN

## 2013-04-18 MED ORDER — FENTANYL CITRATE 0.05 MG/ML IJ SOLN
INTRAMUSCULAR | Status: DC | PRN
  Administered 2013-04-18: 100 ug via INTRAVENOUS

## 2013-04-18 MED ORDER — BUPIVACAINE LIPOSOME 1.3 % IJ SUSP
20.0000 mL | Freq: Once | INTRAMUSCULAR | Status: DC
  Filled 2013-04-18: qty 20

## 2013-04-18 MED ORDER — TRAMADOL HCL 50 MG PO TABS
50.0000 mg | ORAL_TABLET | Freq: Four times a day (QID) | ORAL | Status: DC | PRN
  Administered 2013-04-18 – 2013-04-19 (×3): 100 mg via ORAL
  Filled 2013-04-18 (×3): qty 2

## 2013-04-18 MED ORDER — BUPIVACAINE HCL (PF) 0.25 % IJ SOLN
INTRAMUSCULAR | Status: AC
  Filled 2013-04-18: qty 30

## 2013-04-18 MED ORDER — HYDROMORPHONE HCL PF 1 MG/ML IJ SOLN
INTRAMUSCULAR | Status: AC
  Filled 2013-04-18: qty 1

## 2013-04-18 MED ORDER — BUPIVACAINE HCL 0.25 % IJ SOLN
INTRAMUSCULAR | Status: DC | PRN
  Administered 2013-04-18: 20 mL

## 2013-04-18 MED ORDER — MIDAZOLAM HCL 5 MG/5ML IJ SOLN
INTRAMUSCULAR | Status: DC | PRN
  Administered 2013-04-18: 2 mg via INTRAVENOUS

## 2013-04-18 SURGICAL SUPPLY — 57 items
BAG SPEC THK2 15X12 ZIP CLS (MISCELLANEOUS) ×1
BAG ZIPLOCK 12X15 (MISCELLANEOUS) ×2 IMPLANT
BANDAGE ELASTIC 6 VELCRO ST LF (GAUZE/BANDAGES/DRESSINGS) ×2 IMPLANT
BANDAGE ESMARK 6X9 LF (GAUZE/BANDAGES/DRESSINGS) ×1 IMPLANT
BLADE SAG 18X100X1.27 (BLADE) ×2 IMPLANT
BLADE SAW SGTL 11.0X1.19X90.0M (BLADE) ×2 IMPLANT
BNDG CMPR 9X6 STRL LF SNTH (GAUZE/BANDAGES/DRESSINGS) ×1
BNDG ESMARK 6X9 LF (GAUZE/BANDAGES/DRESSINGS) ×2
BOWL SMART MIX CTS (DISPOSABLE) ×2 IMPLANT
CAPT RP KNEE ×1 IMPLANT
CEMENT HV SMART SET (Cement) ×4 IMPLANT
CLOTH BEACON ORANGE TIMEOUT ST (SAFETY) ×2 IMPLANT
CUFF TOURN SGL QUICK 34 (TOURNIQUET CUFF) ×2
CUFF TRNQT CYL 34X4X40X1 (TOURNIQUET CUFF) ×1 IMPLANT
DECANTER SPIKE VIAL GLASS SM (MISCELLANEOUS) ×2 IMPLANT
DRAPE EXTREMITY T 121X128X90 (DRAPE) ×2 IMPLANT
DRAPE POUCH INSTRU U-SHP 10X18 (DRAPES) ×2 IMPLANT
DRAPE U-SHAPE 47X51 STRL (DRAPES) ×2 IMPLANT
DRSG ADAPTIC 3X8 NADH LF (GAUZE/BANDAGES/DRESSINGS) ×2 IMPLANT
DRSG PAD ABDOMINAL 8X10 ST (GAUZE/BANDAGES/DRESSINGS) ×2 IMPLANT
DURAPREP 26ML APPLICATOR (WOUND CARE) ×2 IMPLANT
ELECT REM PT RETURN 9FT ADLT (ELECTROSURGICAL) ×2
ELECTRODE REM PT RTRN 9FT ADLT (ELECTROSURGICAL) ×1 IMPLANT
EVACUATOR 1/8 PVC DRAIN (DRAIN) ×2 IMPLANT
FACESHIELD LNG OPTICON STERILE (SAFETY) ×10 IMPLANT
GLOVE BIO SURGEON STRL SZ7.5 (GLOVE) IMPLANT
GLOVE BIO SURGEON STRL SZ8 (GLOVE) ×2 IMPLANT
GLOVE BIOGEL PI IND STRL 8 (GLOVE) ×2 IMPLANT
GLOVE BIOGEL PI INDICATOR 8 (GLOVE) ×2
GLOVE SURG SS PI 6.5 STRL IVOR (GLOVE) IMPLANT
GOWN PREVENTION PLUS LG XLONG (DISPOSABLE) ×2 IMPLANT
GOWN STRL REIN XL XLG (GOWN DISPOSABLE) IMPLANT
HANDPIECE INTERPULSE COAX TIP (DISPOSABLE) ×2
IMMOBILIZER KNEE 20 (SOFTGOODS) ×2
IMMOBILIZER KNEE 20 THIGH 36 (SOFTGOODS) ×1 IMPLANT
KIT BASIN OR (CUSTOM PROCEDURE TRAY) ×2 IMPLANT
MANIFOLD NEPTUNE II (INSTRUMENTS) ×2 IMPLANT
NDL SAFETY ECLIPSE 18X1.5 (NEEDLE) ×2 IMPLANT
NEEDLE HYPO 18GX1.5 SHARP (NEEDLE) ×4
NS IRRIG 1000ML POUR BTL (IV SOLUTION) ×2 IMPLANT
PACK TOTAL JOINT (CUSTOM PROCEDURE TRAY) ×2 IMPLANT
PADDING CAST COTTON 6X4 STRL (CAST SUPPLIES) ×4 IMPLANT
POSITIONER SURGICAL ARM (MISCELLANEOUS) ×2 IMPLANT
SET HNDPC FAN SPRY TIP SCT (DISPOSABLE) ×1 IMPLANT
SPONGE GAUZE 4X4 12PLY (GAUZE/BANDAGES/DRESSINGS) ×2 IMPLANT
STRIP CLOSURE SKIN 1/2X4 (GAUZE/BANDAGES/DRESSINGS) ×3 IMPLANT
SUCTION FRAZIER 12FR DISP (SUCTIONS) ×2 IMPLANT
SUT MNCRL AB 4-0 PS2 18 (SUTURE) ×2 IMPLANT
SUT VIC AB 2-0 CT1 27 (SUTURE) ×6
SUT VIC AB 2-0 CT1 TAPERPNT 27 (SUTURE) ×3 IMPLANT
SUT VLOC 180 0 24IN GS25 (SUTURE) ×2 IMPLANT
SYR 20CC LL (SYRINGE) ×2 IMPLANT
SYR 50ML LL SCALE MARK (SYRINGE) ×2 IMPLANT
TOWEL OR 17X26 10 PK STRL BLUE (TOWEL DISPOSABLE) ×4 IMPLANT
TRAY FOLEY CATH 14FRSI W/METER (CATHETERS) ×2 IMPLANT
WATER STERILE IRR 1500ML POUR (IV SOLUTION) ×2 IMPLANT
WRAP KNEE MAXI GEL POST OP (GAUZE/BANDAGES/DRESSINGS) ×2 IMPLANT

## 2013-04-18 NOTE — Op Note (Signed)
Pre-operative diagnosis- Osteoarthritis  Left knee(s)  Post-operative diagnosis- Osteoarthritis Left knee(s)  Procedure-  Left  Total Knee Arthroplasty  Surgeon- Gus Rankin. Jacklyne Baik, MD  Assistant- Avel Peace, PA-C   Anesthesia-  Spinal EBL-* No blood loss amount entered *  Drains Hemovac  Tourniquet time-  Total Tourniquet Time Documented: Thigh (Left) - 43 minutes Total: Thigh (Left) - 43 minutes    Complications- None  Condition-PACU - hemodynamically stable.   Brief Clinical Note   Jake Barnett is a 72 y.o. year old male with end stage OA of his left knee with progressively worsening pain and dysfunction. He has constant pain, with activity and at rest and significant functional deficits with difficulties even with ADLs. He has had extensive non-op management including analgesics, injections of cortisone and viscosupplements, and home exercise program, but remains in significant pain with significant dysfunction. Radiographs show bone on bone arthritis medial and patellofemoral. He presents now for left Total Knee Arthroplasty.     Procedure in detail---   The patient is brought into the operating room and positioned supine on the operating table. After successful administration of  Spinal,   a tourniquet is placed high on the  Left thigh(s) and the lower extremity is prepped and draped in the usual sterile fashion. Time out is performed by the operating team and then the  Left lower extremity is wrapped in Esmarch, knee flexed and the tourniquet inflated to 300 mmHg.       A midline incision is made with a ten blade through the subcutaneous tissue to the level of the extensor mechanism. A fresh blade is used to make a medial parapatellar arthrotomy. Soft tissue over the proximal medial tibia is subperiosteally elevated to the joint line with a knife and into the semimembranosus bursa with a Cobb elevator. Soft tissue over the proximal lateral tibia is elevated with attention being  paid to avoiding the patellar tendon on the tibial tubercle. The patella is everted, knee flexed 90 degrees and the ACL and PCL are removed. Findings are bone on bone medial and patellofemoral with large medial osteophytes.        The drill is used to create a starting hole in the distal femur and the canal is thoroughly irrigated with sterile saline to remove the fatty contents. The 5 degree Left  valgus alignment guide is placed into the femoral canal and the distal femoral cutting block is pinned to remove 10 mm off the distal femur. Resection is made with an oscillating saw.      The tibia is subluxed forward and the menisci are removed. The extramedullary alignment guide is placed referencing proximally at the medial aspect of the tibial tubercle and distally along the second metatarsal axis and tibial crest. The block is pinned to remove 2mm off the more deficient medial  side. Resection is made with an oscillating saw. Size 5is the most appropriate size for the tibia and the proximal tibia is prepared with the modular drill and keel punch for that size.      The femoral sizing guide is placed and size 5 is most appropriate. Rotation is marked off the epicondylar axis and confirmed by creating a rectangular flexion gap at 90 degrees. The size 5 cutting block is pinned in this rotation and the anterior, posterior and chamfer cuts are made with the oscillating saw. The intercondylar block is then placed and that cut is made.      Trial size 5 tibial component, trial  size 5 posterior stabilized femur and a 12.5  mm posterior stabilized rotating platform insert trial is placed. Full extension is achieved with excellent varus/valgus and anterior/posterior balance throughout full range of motion. The patella is everted and thickness measured to be 27  mm. Free hand resection is taken to 15 mm, a 41 template is placed, lug holes are drilled, trial patella is placed, and it tracks normally. Osteophytes are removed  off the posterior femur with the trial in place. All trials are removed and the cut bone surfaces prepared with pulsatile lavage. Cement is mixed and once ready for implantation, the size 5 tibial implant, size  5 posterior stabilized femoral component, and the size 41 patella are cemented in place and the patella is held with the clamp. The trial insert is placed and the knee held in full extension. The Exparel (20 ml mixed with 30 ml saline) and .25% Bupivicaine, are injected into the extensor mechanism, posterior capsule, medial and lateral gutters and subcutaneous tissues.  All extruded cement is removed and once the cement is hard the permanent 12.5 mm posterior stabilized rotating platform insert is placed into the tibial tray.      The wound is copiously irrigated with saline solution and the extensor mechanism closed over a hemovac drain with #1 PDS suture. The tourniquet is released for a total tourniquet time of 43  minutes. Flexion against gravity is 140 degrees and the patella tracks normally. Subcutaneous tissue is closed with 2.0 vicryl and subcuticular with running 4.0 Monocryl. The incision is cleaned and dried and steri-strips and a bulky sterile dressing are applied. The limb is placed into a knee immobilizer and the patient is awakened and transported to recovery in stable condition.      Please note that a surgical assistant was a medical necessity for this procedure in order to perform it in a safe and expeditious manner. Surgical assistant was necessary to retract the ligaments and vital neurovascular structures to prevent injury to them and also necessary for proper positioning of the limb to allow for anatomic placement of the prosthesis.   Gus Rankin Tailey Top, MD    04/18/2013, 1:36 PM

## 2013-04-18 NOTE — H&P (View-Only) (Signed)
Jake Barnett  DOB: 11/07/1940 Married / Language: English / Race: White Male  Date of Admission:  04/18/2013  Chief Complaint:  Left Knee Pain  History of Present Illness The patient is a 72 year old male who comes in for a preoperative History and Physical. The patient is scheduled for a left total knee arthroplasty to be performed by Dr. Frank V. Aluisio, MD at [facility] on 04/18/2013. The patient is a 71 year old male who presents for a recheck of Follow-up Knee. The patient is being followed for their left knee pain and osteoarthritis. They are now week(s) out from synvisc series (helped very little). Symptoms reported today include: pain (on medial side of knee with any weight. Pain increases with increase in activity.), pain at night, swelling and stiffness (after sitting), while the patient does not report symptoms of: instability. The patient feels that they are doing well and report their pain level to be mild to moderate and 3 / 10. The following medication has been used for pain control: none. The patient has not gotten any relief of their symptoms with viscosupplementation.  Unfortunately his left knee is getting progressively worse. Cortisone and visco supplements have not provided any benefit. He continues to have pain throughout the day as well as sometimes at night. It is hard for him to get into a comfortable position. He frequently has to sleep with a pillow between his legs. The knee is now limiting what he can and can not do. He would like to be more active but the knee is preventing him from doing so.  He is now ready for surgery. They have been treated conservatively in the past for the above stated problem and despite conservative measures, they continue to have progressive pain and severe functional limitations and dysfunction. They have failed non-operative management including home exercise, medications. It is felt that they would benefit from undergoing  total joint replacement. Risks and benefits of the procedure have been discussed with the patient and they elect to proceed with surgery. There are no active contraindications to surgery such as ongoing infection or rapidly progressive neurological disease.  Problem List Primary osteoarthritis of both knees (715.16)   Allergies Indocin *ANALGESICS - ANTI-INFLAMMATORY*   Family History Diabetes Mellitus. grandmother fathers side Rheumatoid Arthritis. mother and father Cancer. grandfather fathers side Hypertension. mother and father Congestive Heart Failure. mother   Social History Living situation. live with spouse Current work status. retired Marital status. married Children. 1 Pain Contract. no Tobacco / smoke exposure. no Alcohol use. never consumed alcohol Illicit drug use. no Tobacco use. never smoker Number of flights of stairs before winded. 4-5 Drug/Alcohol Rehab (Previously). no Drug/Alcohol Rehab (Currently). no Exercise. Exercises rarely; does running / walking   Past Surgical History Neck Disc Surgery Spinal Fusion. neck Colon Polyp Removal - Colonoscopy Arthroscopy of Knee. right Arthroscopy of Shoulder. right   Medical History Skin Cancer Gastroesophageal Reflux Disease Kidney Stone Gout Ulcer disease Hiatal Hernia Hemorrhoids Scarlet Fever Measles Mumps   Review of Systems General:Present- Fatigue. Not Present- Chills, Fever, Night Sweats, Weight Gain, Weight Loss and Memory Loss. Skin:Not Present- Hives, Itching, Rash, Eczema and Lesions. HEENT:Not Present- Tinnitus, Headache, Double Vision, Visual Loss, Hearing Loss and Dentures. Respiratory:Not Present- Shortness of breath with exertion, Shortness of breath at rest, Allergies, Coughing up blood and Chronic Cough. Cardiovascular:Not Present- Chest Pain, Racing/skipping heartbeats, Difficulty Breathing Lying Down, Murmur, Swelling and  Palpitations. Gastrointestinal:Present- Heartburn. Not Present- Bloody Stool, Abdominal Pain, Vomiting,   Nausea, Constipation, Diarrhea, Difficulty Swallowing, Jaundice and Loss of appetitie. Male Genitourinary:Not Present- Urinary frequency, Blood in Urine, Weak urinary stream, Discharge, Flank Pain, Incontinence, Painful Urination, Urgency, Urinary Retention and Urinating at Night. Musculoskeletal:Present- Joint Pain. Not Present- Muscle Weakness, Muscle Pain, Joint Swelling, Back Pain, Morning Stiffness and Spasms. Neurological:Not Present- Tremor, Dizziness, Blackout spells, Paralysis, Difficulty with balance and Weakness. Psychiatric:Not Present- Insomnia.   Vitals Weight: 210 lb Height: 71 in Weight was reported by patient. Height was reported by patient. Body Surface Area: 2.18 m Body Mass Index: 29.29 kg/m Pulse: 64 (Regular) Resp.: 14 (Unlabored) BP: 138/78 (Sitting, Right Arm, Standard)    Physical Exam The physical exam findings are as follows:   General Mental Status - Alert, cooperative and good historian. General Appearance- pleasant. Not in acute distress. Orientation- Oriented X3. Build & Nutrition- Well nourished and Well developed.   Head and Neck Head- normocephalic, atraumatic . Neck Global Assessment- supple. no bruit auscultated on the right and no bruit auscultated on the left.   Eye Vision- Wears corrective lenses. Pupil- Bilateral- Regular and Round. Motion- Bilateral- EOMI.   Chest and Lung Exam Auscultation: Breath sounds:- clear at anterior chest wall and - clear at posterior chest wall. Adventitious sounds:- No Adventitious sounds.   Cardiovascular Auscultation:Rhythm- Regular rate and rhythm. Heart Sounds- S1 WNL and S2 WNL. Murmurs & Other Heart Sounds:Auscultation of the heart reveals - No Murmurs.   Abdomen Palpation/Percussion:Tenderness- Abdomen is non-tender to palpation. Rigidity  (guarding)- Abdomen is soft. Auscultation:Auscultation of the abdomen reveals - Bowel sounds normal.   Male Genitourinary Not done, not pertinent to present illness  Musculoskeletal On exam he is alert and oriented in no apparent distress. His hips show normal range of motion, no discomfort. His left knee shows slight varus deformity. There is no effusion. Range is about 10 to 125. There is marked crepitus on range of motion. He is tender medial greater than lateral with no instability.  RADIOGRAPHS: Review of previous x-rays show bone on bone arthritis in the medial compartment and patellofemoral compartment of the left knee.  Assessment & Plan Primary osteoarthritis of both knees (715.16)  Note: Plan is for a Left Total Knee Replacement by Dr. Aluisio.  Plan is to go home with this wife.  PCP - [facility] - Dr. Crenshaw  The patient does not have any contraindications and will recieve TXA (tranexamic acid) prior to surgery.  Time spent ~ 35 minutes  Signed electronically by Rhyder Bratz L Zhaniya Swallows, III PA-C  

## 2013-04-18 NOTE — Anesthesia Procedure Notes (Signed)
Spinal  Patient location during procedure: OR Start time: 04/18/2013 12:23 PM End time: 04/18/2013 12:25 PM Staffing CRNA/Resident: Carmelia Roller R Performed by: resident/CRNA  Preanesthetic Checklist Completed: patient identified, site marked, surgical consent, pre-op evaluation, timeout performed, IV checked, risks and benefits discussed and monitors and equipment checked Spinal Block Patient position: sitting Prep: Betadine Patient monitoring: heart rate, continuous pulse ox and blood pressure Approach: midline Location: L3-4 Injection technique: single-shot Needle Needle type: Sprotte  Needle gauge: 24 G Needle length: 9 cm Needle insertion depth: 6 cm Assessment Sensory level: T6

## 2013-04-18 NOTE — Progress Notes (Signed)
Utilization review completed.  

## 2013-04-18 NOTE — Transfer of Care (Signed)
Immediate Anesthesia Transfer of Care Note  Patient: Jake Barnett  Procedure(s) Performed: Procedure(s): LEFT TOTAL KNEE ARTHROPLASTY (Left)  Patient Location: PACU  Anesthesia Type:Spinal  Level of Consciousness: sedated  Airway & Oxygen Therapy: Patient Spontanous Breathing and Patient connected to face mask oxygen  Post-op Assessment: Report given to PACU RN and Post -op Vital signs reviewed and stable  Post vital signs: Reviewed and stable  Complications: No apparent anesthesia complications

## 2013-04-18 NOTE — Interval H&P Note (Signed)
History and Physical Interval Note:  04/18/2013 11:39 AM  Jake Barnett  has presented today for surgery, with the diagnosis of OA OF LEFT KNEE  The various methods of treatment have been discussed with the patient and family. After consideration of risks, benefits and other options for treatment, the patient has consented to  Procedure(s): LEFT TOTAL KNEE ARTHROPLASTY (Left) as a surgical intervention .  The patient's history has been reviewed, patient examined, no change in status, stable for surgery.  I have reviewed the patient's chart and labs.  Questions were answered to the patient's satisfaction.     Loanne Drilling

## 2013-04-18 NOTE — Anesthesia Preprocedure Evaluation (Addendum)
Anesthesia Evaluation  Patient identified by MRN, date of birth, ID band Patient awake    Reviewed: Allergy & Precautions, H&P , NPO status , Patient's Chart, lab work & pertinent test results  Airway Mallampati: III TM Distance: >3 FB Neck ROM: Full   Comment: S/p cervical fusion Dental no notable dental hx.    Pulmonary neg pulmonary ROS,  breath sounds clear to auscultation  Pulmonary exam normal       Cardiovascular hypertension, Rhythm:Regular Rate:Normal     Neuro/Psych negative neurological ROS  negative psych ROS   GI/Hepatic Neg liver ROS, hiatal hernia, GERD-  ,  Endo/Other  negative endocrine ROS  Renal/GU negative Renal ROS  negative genitourinary   Musculoskeletal negative musculoskeletal ROS (+)   Abdominal   Peds negative pediatric ROS (+)  Hematology negative hematology ROS (+)   Anesthesia Other Findings   Reproductive/Obstetrics negative OB ROS                         Anesthesia Physical Anesthesia Plan  ASA: II  Anesthesia Plan: Spinal   Post-op Pain Management:    Induction:   Airway Management Planned: Simple Face Mask  Additional Equipment:   Intra-op Plan:   Post-operative Plan:   Informed Consent: I have reviewed the patients History and Physical, chart, labs and discussed the procedure including the risks, benefits and alternatives for the proposed anesthesia with the patient or authorized representative who has indicated his/her understanding and acceptance.   Dental advisory given  Plan Discussed with: CRNA  Anesthesia Plan Comments:         Anesthesia Quick Evaluation

## 2013-04-19 ENCOUNTER — Encounter (HOSPITAL_COMMUNITY): Payer: Self-pay | Admitting: Orthopedic Surgery

## 2013-04-19 DIAGNOSIS — D62 Acute posthemorrhagic anemia: Secondary | ICD-10-CM

## 2013-04-19 HISTORY — DX: Acute posthemorrhagic anemia: D62

## 2013-04-19 LAB — CBC
Hemoglobin: 12.8 g/dL — ABNORMAL LOW (ref 13.0–17.0)
MCH: 29.1 pg (ref 26.0–34.0)
Platelets: 209 10*3/uL (ref 150–400)
RBC: 4.4 MIL/uL (ref 4.22–5.81)

## 2013-04-19 LAB — BASIC METABOLIC PANEL
CO2: 27 mEq/L (ref 19–32)
Calcium: 8.3 mg/dL — ABNORMAL LOW (ref 8.4–10.5)
GFR calc non Af Amer: 85 mL/min — ABNORMAL LOW (ref >90)
Glucose, Bld: 143 mg/dL — ABNORMAL HIGH (ref 70–99)
Potassium: 4.1 mEq/L (ref 3.5–5.1)
Sodium: 135 mEq/L (ref 135–145)

## 2013-04-19 MED ORDER — ACETAMINOPHEN 500 MG PO TABS
1000.0000 mg | ORAL_TABLET | Freq: Four times a day (QID) | ORAL | Status: DC | PRN
  Administered 2013-04-19: 1000 mg via ORAL
  Filled 2013-04-19: qty 2

## 2013-04-19 MED ORDER — RANITIDINE HCL 150 MG/10ML PO SYRP
150.0000 mg | ORAL_SOLUTION | Freq: Two times a day (BID) | ORAL | Status: DC
  Filled 2013-04-19 (×2): qty 10

## 2013-04-19 MED ORDER — RANITIDINE HCL 150 MG PO TABS
150.0000 mg | ORAL_TABLET | Freq: Two times a day (BID) | ORAL | Status: DC
  Administered 2013-04-19 – 2013-04-20 (×3): 150 mg via ORAL
  Filled 2013-04-19 (×4): qty 1

## 2013-04-19 NOTE — Anesthesia Postprocedure Evaluation (Signed)
  Anesthesia Post-op Note  Patient: Jake Barnett  Procedure(s) Performed: Procedure(s) (LRB): LEFT TOTAL KNEE ARTHROPLASTY (Left)  Patient Location: PACU  Anesthesia Type: Spinal  Level of Consciousness: awake and alert   Airway and Oxygen Therapy: Patient Spontanous Breathing  Post-op Pain: mild  Post-op Assessment: Post-op Vital signs reviewed, Patient's Cardiovascular Status Stable, Respiratory Function Stable, Patent Airway and No signs of Nausea or vomiting  Last Vitals:  Filed Vitals:   04/19/13 0636  BP: 119/69  Pulse: 79  Temp: 36.6 C  Resp: 16    Post-op Vital Signs: stable   Complications: No apparent anesthesia complications

## 2013-04-19 NOTE — Progress Notes (Signed)
Physical Therapy Treatment Patient Details Name: Jake Barnett MRN: [id number] DOB: 1941/01/28 Today's Date: 04/19/2013 Time: 8295-6213 PT Time Calculation (min): 19 min  PT Assessment / Plan / Recommendation  History of Present Illness LTKA on 04/1513   PT Comments   Pt is progressing well. Plans Dc tomorrow.  Follow Up Recommendations  Home health PT     Does the patient have the potential to tolerate intense rehabilitation     Barriers to Discharge        Equipment Recommendations  Rolling walker with 5" wheels    Recommendations for Other Services    Frequency 7X/week   Progress towards PT Goals Progress towards PT goals: Progressing toward goals  Plan Current plan remains appropriate    Precautions / Restrictions Precautions Precautions: Knee Required Braces or Orthoses: Knee Immobilizer - Left Knee Immobilizer - Left: Discontinue once straight leg raise with < 10 degree lag   Pertinent Vitals/Pain 2 L knee    Mobility  Bed Mobility Bed Mobility: Sit to Supine Supine to Sit: 4: Min assist;HOB elevated Sit to Supine: 4: Min assist Details for Bed Mobility Assistance: support LLE onto bed   Transfers Transfers: Sit to Stand;Stand to Sit Sit to Stand: 4: Min assist;With upper extremity assist;From chair/3-in-1;With armrests Stand to Sit: With upper extremity assist;4: Min guard;To bed Details for Transfer Assistance: verbal cues for UE use and LLE position prior to sitting/ Ambulation/Gait Ambulation/Gait Assistance: 4: Min guard Ambulation Distance (Feet): 170 Feet Assistive device: Rolling walker Ambulation/Gait Assistance Details: verbal cues for safety, to shorten step  length on L and step through with R Gait Pattern: Step-to pattern Gait velocity: decr    Exercises Total Joint Exercises Ankle Circles/Pumps: AROM;Supine Quad Sets: AROM;Left;10 reps;Seated;Supine Heel Slides: AAROM;Left;10 reps;Supine Straight Leg Raises: AAROM;Left;10  reps;Supine Goniometric ROM: 10-40 l knee   PT Diagnosis: Difficulty walking;Acute pain  PT Problem List: Decreased strength;Decreased range of motion;Decreased activity tolerance;Decreased mobility;Decreased knowledge of use of DME;Decreased safety awareness;Decreased knowledge of precautions;Pain PT Treatment Interventions: DME instruction;Gait training;Stair training;Functional mobility training;Therapeutic activities;Therapeutic exercise;Patient/family education   PT Goals (current goals can now be found in the care plan section) Acute Rehab PT Goals Patient Stated Goal: To walk again. PT Goal Formulation: With patient/family Time For Goal Achievement: 04/26/13 Potential to Achieve Goals: Good  Visit Information  Last PT Received On: 04/19/13 Assistance Needed: +1 History of Present Illness: LTKA on 04/1513    Subjective Data  Patient Stated Goal: To walk again.   Cognition  Cognition Arousal/Alertness: Awake/alert Behavior During Therapy: WFL for tasks assessed/performed Overall Cognitive Status: Within Functional Limits for tasks assessed    Balance     End of Session PT - End of Session Equipment Utilized During Treatment: Left knee immobilizer Activity Tolerance: Patient tolerated treatment well Patient left: with call bell/phone within reach;in bed Nurse Communication: Mobility status CPM Left Knee CPM Left Knee: On   GP     Rada Hay 04/19/2013, 2:37 PM

## 2013-04-19 NOTE — Progress Notes (Addendum)
[facility]  [facility] is providing the following services: Com[employer] (patient already has rw)  If patient discharges after hours, please call [phone].   Renard Hamper 04/19/2013, 12:25 PM

## 2013-04-19 NOTE — Evaluation (Signed)
Physical Therapy Evaluation Patient Details Name: Jake Barnett MRN: [id number] DOB: 02-Sep-1940 Today's Date: 04/19/2013 Time: 1027-1050 PT Time Calculation (min): 23 min  PT Assessment / Plan / Recommendation History of Present Illness  LTKA on 04/1513  Clinical Impression  Pt tolerated ambulation, has been nauseated and dizzy. Pt will benefit from PT while in acute care for safe use of RW, increase ROM and strength.    PT Assessment  Patient needs continued PT services    Follow Up Recommendations  Home health PT    Does the patient have the potential to tolerate intense rehabilitation      Barriers to Discharge        Equipment Recommendations  Rolling walker with 5" wheels    Recommendations for Other Services     Frequency 7X/week    Precautions / Restrictions Precautions Precautions: Knee Required Braces or Orthoses: Knee Immobilizer - Left Knee Immobilizer - Left: Discontinue once straight leg raise with < 10 degree lag Restrictions Weight Bearing Restrictions: No   Pertinent Vitals/Pain 2 L knee      Mobility  Bed Mobility Bed Mobility: Supine to Sit Supine to Sit: 4: Min assist;HOB elevated Details for Bed Mobility Assistance: support LLE to swing over edge of bed and lower to the floor.  Transfers Transfers: Sit to Stand;Stand to Sit Sit to Stand: 4: Min assist;From bed;With upper extremity assist;From elevated surface Stand to Sit: To chair/3-in-1;With upper extremity assist;4: Min guard Details for Transfer Assistance: verbal cues for UE use and LLE position prior to sitting/ Ambulation/Gait Ambulation/Gait Assistance: 4: Min assist Ambulation Distance (Feet): 85 Feet Assistive device: Rolling walker Ambulation/Gait Assistance Details: verbal cues for safe use of RW and for sequence. Pt has decr. Neck ROM so difficult to look ahead. Gait Pattern: Step-to pattern Gait velocity: decr    Exercises Total Joint Exercises Ankle Circles/Pumps:  AROM;Both;10 reps;Seated Quad Sets: AROM;Left;10 reps;Seated   PT Diagnosis: Difficulty walking;Acute pain  PT Problem List: Decreased strength;Decreased range of motion;Decreased activity tolerance;Decreased mobility;Decreased knowledge of use of DME;Decreased safety awareness;Decreased knowledge of precautions;Pain PT Treatment Interventions: DME instruction;Gait training;Stair training;Functional mobility training;Therapeutic activities;Therapeutic exercise;Patient/family education     PT Goals(Current goals can be found in the care plan section) Acute Rehab PT Goals Patient Stated Goal: To walk again. PT Goal Formulation: With patient/family Time For Goal Achievement: 04/26/13 Potential to Achieve Goals: Good  Visit Information  Last PT Received On: 04/19/13 Assistance Needed: +1 History of Present Illness: LTKA on 04/1513       Prior Functioning  Home Living Family/patient expects to be discharged to:: Private residence Living Arrangements: Spouse/significant other;Children Available Help at Discharge: Family Type of Home: House Home Access: Stairs to enter Secretary/administrator of Steps: 1 Home Layout: One level Home Equipment: Cane - single point Prior Function Level of Independence: Independent Communication Communication: No difficulties    Cognition  Cognition Arousal/Alertness: Awake/alert Behavior During Therapy: WFL for tasks assessed/performed Overall Cognitive Status: Within Functional Limits for tasks assessed    Extremity/Trunk Assessment Upper Extremity Assessment Upper Extremity Assessment: Defer to OT evaluation Lower Extremity Assessment Lower Extremity Assessment: LLE deficits/detail LLE Deficits / Details: able to perform SLR   Balance    End of Session PT - End of Session Equipment Utilized During Treatment: Left knee immobilizer Activity Tolerance: Patient tolerated treatment well Patient left: in chair;with call bell/phone within  reach;with family/visitor present CPM Left Knee CPM Left Knee: Off  GP     Rada Hay 04/19/2013, 11:02  AM Tresa Endo PT [phone]

## 2013-04-19 NOTE — Progress Notes (Signed)
   Subjective: 1 Day Post-Op Procedure(s) (LRB): LEFT TOTAL KNEE ARTHROPLASTY (Left) Patient reports pain as mild.   Patient seen in rounds with Dr. Lequita Halt. Family in room at bedside Patient is well, and has had no acute complaints or problems We will start therapy today. Already doing SLR's Plan is to go Home after hospital stay.  Objective: Vital signs in last 24 hours: Temp:  [97.6 F (36.4 C)-98.7 F (37.1 C)] 97.9 F (36.6 C) (09/16 0636) Pulse Rate:  [66-101] 79 (09/16 0636) Resp:  [13-18] 16 (09/16 0636) BP: (111-158)/(62-82) 119/69 mmHg (09/16 0636) SpO2:  [97 %-100 %] 99 % (09/16 0636) Weight:  [95.3 kg (210 lb 1.6 oz)] 95.3 kg (210 lb 1.6 oz) (09/15 1520)  Intake/Output from previous day:  Intake/Output Summary (Last 24 hours) at 04/19/13 0814 Last data filed at 04/19/13 0700  Gross per 24 hour  Intake 3742.5 ml  Output 1602.5 ml  Net   2140 ml    Intake/Output this shift: UOP 600 since MN  Labs:  Recent Labs  04/19/13 0503  HGB 12.8*    Recent Labs  04/19/13 0503  WBC 14.4*  RBC 4.40  HCT 37.3*  PLT 209    Recent Labs  04/19/13 0503  NA 135  K 4.1  CL 100  CO2 27  BUN 11  CREATININE 0.87  GLUCOSE 143*  CALCIUM 8.3*   No results found for this basename: LABPT, INR,  in the last 72 hours  EXAM General - Patient is Alert, Appropriate and Oriented Extremity - Neurovascular intact Sensation intact distally Dorsiflexion/Plantar flexion intact Dressing - dressing C/D/I Motor Function - intact, moving foot and toes well on exam.  Hemovac pulled without difficulty.  Past Medical History  Diagnosis Date  . GERD (gastroesophageal reflux disease)   . Palpitations     a. monitoring 2009 - occasional PAC/PVC.  Marland Kitchen Dyspnea   . Hyperlipidemia   . Diastolic dysfunction     Noted per stress echo in 2010  . Arthritis   . Chest pain     a. 12/2008 nl stress echo.;  b. 02/2011 nl myoview, EF 66%.  . H/O hiatal hernia   . History of kidney  stones     Assessment/Plan: 1 Day Post-Op Procedure(s) (LRB): LEFT TOTAL KNEE ARTHROPLASTY (Left) Principal Problem:   OA (osteoarthritis) of knee Active Problems:   GERD (gastroesophageal reflux disease) - resumed Zantac (pepcid subbed)   Postoperative anemia due to acute blood loss  Estimated body mass index is 30.08 kg/(m^2) as calculated from the following:   Height as of this encounter: 5' 10.08" (1.78 m).   Weight as of this encounter: 95.3 kg (210 lb 1.6 oz). Advance diet Up with therapy Plan for discharge tomorrow Discharge home with home health  DVT Prophylaxis - Xarelto Weight-Bearing as tolerated to left leg No vaccines. D/C O2 and Pulse OX and try on Room Air  Roxy Mastandrea, Marlowe Sax 04/19/2013, 8:14 AM

## 2013-04-20 LAB — CBC
HCT: 35.2 % — ABNORMAL LOW (ref 39.0–52.0)
Hemoglobin: 12 g/dL — ABNORMAL LOW (ref 13.0–17.0)
MCV: 85 fL (ref 78.0–100.0)
RBC: 4.14 MIL/uL — ABNORMAL LOW (ref 4.22–5.81)
WBC: 13.6 10*3/uL — ABNORMAL HIGH (ref 4.0–10.5)

## 2013-04-20 LAB — BASIC METABOLIC PANEL
BUN: 12 mg/dL (ref 6–23)
CO2: 30 mEq/L (ref 19–32)
Chloride: 102 mEq/L (ref 96–112)
Creatinine, Ser: 0.98 mg/dL (ref 0.50–1.35)
Potassium: 3.6 mEq/L (ref 3.5–5.1)

## 2013-04-20 MED ORDER — METHOCARBAMOL 500 MG PO TABS: 500.0000 mg | ORAL_TABLET | Freq: Four times a day (QID) | ORAL | Status: DC | PRN

## 2013-04-20 MED ORDER — TRAMADOL HCL 50 MG PO TABS: 50.0000 mg | ORAL_TABLET | Freq: Four times a day (QID) | ORAL | Status: DC | PRN

## 2013-04-20 MED ORDER — OXYCODONE HCL 5 MG PO TABS: 5.0000 mg | ORAL_TABLET | ORAL | Status: DC | PRN

## 2013-04-20 MED ORDER — RIVAROXABAN 10 MG PO TABS: 10.0000 mg | ORAL_TABLET | Freq: Every day | ORAL | Status: DC

## 2013-04-20 NOTE — Discharge Summary (Signed)
Physician Discharge Summary   Patient ID: WILLIARD KELLER MRN: [id number] DOB/AGE: [birth year] 70 y.o.  Admit date: 04/18/2013 Discharge date: 04/20/2013  Primary Diagnosis:  Osteoarthritis Left knee(s)  Admission Diagnoses:  Past Medical History  Diagnosis Date  . GERD (gastroesophageal reflux disease)   . Palpitations     a. monitoring 2009 - occasional PAC/PVC.  Marland Kitchen Dyspnea   . Hyperlipidemia   . Diastolic dysfunction     Noted per stress echo in 2010  . Arthritis   . Chest pain     a. 12/2008 nl stress echo.;  b. 02/2011 nl myoview, EF 66%.  . H/O hiatal hernia   . History of kidney stones    Discharge Diagnoses:   Principal Problem:   OA (osteoarthritis) of knee Active Problems:   GERD (gastroesophageal reflux disease)   Postoperative anemia due to acute blood loss  Estimated body mass index is 30.08 kg/(m^2) as calculated from the following:   Height as of this encounter: 5' 10.08" (1.78 m).   Weight as of this encounter: 95.3 kg (210 lb 1.6 oz).  Procedure:  Procedure(s) (LRB): LEFT TOTAL KNEE ARTHROPLASTY (Left)   Consults: None  HPI: Jake Barnett is a 72 y.o. year old male with end stage OA of his left knee with progressively worsening pain and dysfunction. He has constant pain, with activity and at rest and significant functional deficits with difficulties even with ADLs. He has had extensive non-op management including analgesics, injections of cortisone and viscosupplements, and home exercise program, but remains in significant pain with significant dysfunction. Radiographs show bone on bone arthritis medial and patellofemoral. He presents now for left Total Knee Arthroplasty.  Laboratory Data: Admission on 04/18/2013, Discharged on 04/20/2013  Component Date Value Range Status  . ABO/RH(D) 04/18/2013 O POS   Final  . Antibody Screen 04/18/2013 NEG   Final  . Sample Expiration 04/18/2013 04/21/2013   Final  . ABO/RH(D) 04/18/2013 O POS   Final  . WBC 04/19/2013  14.4* 4.0 - 10.5 K/uL Final  . RBC 04/19/2013 4.40  4.22 - 5.81 MIL/uL Final  . Hemoglobin 04/19/2013 12.8* 13.0 - 17.0 g/dL Final  . HCT 78/29/5621 37.3* 39.0 - 52.0 % Final  . MCV 04/19/2013 84.8  78.0 - 100.0 fL Final  . MCH 04/19/2013 29.1  26.0 - 34.0 pg Final  . MCHC 04/19/2013 34.3  30.0 - 36.0 g/dL Final  . RDW 30/86/5784 13.1  11.5 - 15.5 % Final  . Platelets 04/19/2013 209  150 - 400 K/uL Final  . Sodium 04/19/2013 135  135 - 145 mEq/L Final  . Potassium 04/19/2013 4.1  3.5 - 5.1 mEq/L Final  . Chloride 04/19/2013 100  96 - 112 mEq/L Final  . CO2 04/19/2013 27  19 - 32 mEq/L Final  . Glucose, Bld 04/19/2013 143* 70 - 99 mg/dL Final  . BUN 69/62/9528 11  6 - 23 mg/dL Final  . Creatinine, Ser 04/19/2013 0.87  0.50 - 1.35 mg/dL Final  . Calcium 41/32/4401 8.3* 8.4 - 10.5 mg/dL Final  . GFR calc non Af Amer 04/19/2013 85* >90 mL/min Final  . GFR calc Af Amer 04/19/2013 >90  >90 mL/min Final   Comment: (NOTE)                          The eGFR has been calculated using the CKD EPI equation.  This calculation has not been validated in all clinical situations.                          eGFR's persistently <90 mL/min signify possible Chronic Kidney                          Disease.  . WBC 04/20/2013 13.6* 4.0 - 10.5 K/uL Final  . RBC 04/20/2013 4.14* 4.22 - 5.81 MIL/uL Final  . Hemoglobin 04/20/2013 12.0* 13.0 - 17.0 g/dL Final  . HCT 16/05/9603 35.2* 39.0 - 52.0 % Final  . MCV 04/20/2013 85.0  78.0 - 100.0 fL Final  . MCH 04/20/2013 29.0  26.0 - 34.0 pg Final  . MCHC 04/20/2013 34.1  30.0 - 36.0 g/dL Final  . RDW 54/04/8118 13.4  11.5 - 15.5 % Final  . Platelets 04/20/2013 192  150 - 400 K/uL Final  . Sodium 04/20/2013 138  135 - 145 mEq/L Final  . Potassium 04/20/2013 3.6  3.5 - 5.1 mEq/L Final  . Chloride 04/20/2013 102  96 - 112 mEq/L Final  . CO2 04/20/2013 30  19 - 32 mEq/L Final  . Glucose, Bld 04/20/2013 125* 70 - 99 mg/dL Final  . BUN 14/78/2956  12  6 - 23 mg/dL Final  . Creatinine, Ser 04/20/2013 0.98  0.50 - 1.35 mg/dL Final  . Calcium 21/30/8657 8.5  8.4 - 10.5 mg/dL Final  . GFR calc non Af Amer 04/20/2013 81* >90 mL/min Final  . GFR calc Af Amer 04/20/2013 >90  >90 mL/min Final   Comment: (NOTE)                          The eGFR has been calculated using the CKD EPI equation.                          This calculation has not been validated in all clinical situations.                          eGFR's persistently <90 mL/min signify possible Chronic Kidney                          Disease.  Hospital Outpatient Visit on 04/12/2013  Component Date Value Range Status  . MRSA, PCR 04/12/2013 NEGATIVE  NEGATIVE Final  . Staphylococcus aureus 04/12/2013 NEGATIVE  NEGATIVE Final   Comment:                                 The Xpert SA Assay (FDA                          approved for NASAL specimens                          in patients over 45 years of age),                          is one component of                          a comprehensive surveillance  program.  Test performance has                          been validated by [facility] for patients greater                          than or equal to 19 year old.                          It is not intended                          to diagnose infection nor to                          guide or monitor treatment.  Marland Kitchen aPTT 04/12/2013 26  24 - 37 seconds Final  . WBC 04/12/2013 7.3  4.0 - 10.5 K/uL Final  . RBC 04/12/2013 5.31  4.22 - 5.81 MIL/uL Final  . Hemoglobin 04/12/2013 15.7  13.0 - 17.0 g/dL Final  . HCT 40/98/1191 45.4  39.0 - 52.0 % Final  . MCV 04/12/2013 85.5  78.0 - 100.0 fL Final  . MCH 04/12/2013 29.6  26.0 - 34.0 pg Final  . MCHC 04/12/2013 34.6  30.0 - 36.0 g/dL Final  . RDW 47/82/9562 13.4  11.5 - 15.5 % Final  . Platelets 04/12/2013 227  150 - 400 K/uL Final  . Sodium 04/12/2013 139  135 - 145 mEq/L Final  .  Potassium 04/12/2013 4.3  3.5 - 5.1 mEq/L Final  . Chloride 04/12/2013 103  96 - 112 mEq/L Final  . CO2 04/12/2013 30  19 - 32 mEq/L Final  . Glucose, Bld 04/12/2013 91  70 - 99 mg/dL Final  . BUN 13/03/6577 15  6 - 23 mg/dL Final  . Creatinine, Ser 04/12/2013 1.02  0.50 - 1.35 mg/dL Final  . Calcium 46/96/2952 9.4  8.4 - 10.5 mg/dL Final  . Total Protein 04/12/2013 6.8  6.0 - 8.3 g/dL Final  . Albumin 84/13/2440 3.8  3.5 - 5.2 g/dL Final  . AST 05/31/2535 17  0 - 37 U/L Final  . ALT 04/12/2013 8  0 - 53 U/L Final  . Alkaline Phosphatase 04/12/2013 82  39 - 117 U/L Final  . Total Bilirubin 04/12/2013 1.0  0.3 - 1.2 mg/dL Final  . GFR calc non Af Amer 04/12/2013 72* >90 mL/min Final  . GFR calc Af Amer 04/12/2013 83* >90 mL/min Final   Comment: (NOTE)                          The eGFR has been calculated using the CKD EPI equation.                          This calculation has not been validated in all clinical situations.                          eGFR's persistently <90 mL/min signify possible Chronic Kidney  Disease.  Marland Kitchen Prothrombin Time 04/12/2013 12.5  11.6 - 15.2 seconds Final  . INR 04/12/2013 0.95  0.00 - 1.49 Final  . Color, Urine 04/12/2013 YELLOW  YELLOW Final  . APPearance 04/12/2013 CLEAR  CLEAR Final  . Specific Gravity, Urine 04/12/2013 1.020  1.005 - 1.030 Final  . pH 04/12/2013 6.5  5.0 - 8.0 Final  . Glucose, UA 04/12/2013 NEGATIVE  NEGATIVE mg/dL Final  . Hgb urine dipstick 04/12/2013 NEGATIVE  NEGATIVE Final  . Bilirubin Urine 04/12/2013 NEGATIVE  NEGATIVE Final  . Ketones, ur 04/12/2013 NEGATIVE  NEGATIVE mg/dL Final  . Protein, ur 16/05/9603 NEGATIVE  NEGATIVE mg/dL Final  . Urobilinogen, UA 04/12/2013 0.2  0.0 - 1.0 mg/dL Final  . Nitrite 54/04/8118 NEGATIVE  NEGATIVE Final  . Leukocytes, UA 04/12/2013 NEGATIVE  NEGATIVE Final   MICROSCOPIC NOT DONE ON URINES WITH NEGATIVE PROTEIN, BLOOD, LEUKOCYTES, NITRITE, OR GLUCOSE <1000 mg/dL.      X-Rays:No results found.  EKG: Orders placed in visit on 04/05/13  . EKG 12-LEAD     Hospital Course: GAVINN COLLARD is a 72 y.o. who was admitted to [facility]. They were brought to the operating room on 04/18/2013 and underwent Procedure(s): LEFT TOTAL KNEE ARTHROPLASTY.  Patient tolerated the procedure well and was later transferred to the recovery room and then to the orthopaedic floor for postoperative care.  They were given PO and IV analgesics for pain control following their surgery.  They were given 24 hours of postoperative antibiotics of  Anti-infectives   Start     Dose/Rate Route Frequency Ordered Stop   04/18/13 1830  ceFAZolin (ANCEF) IVPB 2 g/50 mL premix     2 g 100 mL/hr over 30 Minutes Intravenous Every 6 hours 04/18/13 1530 04/19/13 0051   04/18/13 1000  ceFAZolin (ANCEF) IVPB 2 g/50 mL premix     2 g 100 mL/hr over 30 Minutes Intravenous On call to O.R. 04/18/13 1478 04/18/13 1230     and started on DVT prophylaxis in the form of Xarelto.   PT and OT were ordered for total joint protocol.  Discharge planning consulted to help with postop disposition and equipment needs.  Patient had a good night on the evening of surgery.  They started to get up OOB with therapy on day one. Hemovac drain was pulled without difficulty.  Continued to work with therapy into day two.  Dressing was changed on day two and the incision was healing well.  Patient was seen in rounds and was ready to go home later that day.   Discharge Medications: Prior to Admission medications   Medication Sig Start Date End Date Taking? Authorizing Provider  docusate sodium (COLACE) 100 MG capsule Take 100 mg by mouth 2 (two) times daily.   Yes Historical Provider, MD  ranitidine (ZANTAC) 150 MG tablet Take 150 mg by mouth as needed for heartburn.   Yes Historical Provider, MD  methocarbamol (ROBAXIN) 500 MG tablet Take 1 tablet (500 mg total) by mouth every 6 (six) hours as needed. 04/20/13    Shundra Wirsing, PA-C  oxyCODONE (OXY IR/ROXICODONE) 5 MG immediate release tablet Take 1-2 tablets (5-10 mg total) by mouth every 3 (three) hours as needed. 04/20/13   Kihanna Kamiya Julien Girt, PA-C  rivaroxaban (XARELTO) 10 MG TABS tablet Take 1 tablet (10 mg total) by mouth daily with breakfast. Take Xarelto for two and a half more weeks, then discontinue Xarelto. Once the patient has completed the blood thinner regimen, then take a  Baby 81 mg Aspirin daily for four more weeks. 04/20/13   Burnett Spray, PA-C  traMADol (ULTRAM) 50 MG tablet Take 1-2 tablets (50-100 mg total) by mouth every 6 (six) hours as needed (mild pain). 04/20/13   Zyaire Dumas Julien Girt, PA-C   Discharge home with home health  Diet - Regular diet  Follow up - in 2 weeks  Activity - WBAT  Disposition - Home  Condition Upon Discharge - Good  D/C Meds - See DC Summary  DVT Prophylaxis - Xarelto      Discharge Orders   Future Orders Complete By Expires   Call MD / Call 911  As directed    Comments:     If you experience chest pain or shortness of breath, CALL 911 and be transported to the hospital emergency room.  If you develope a fever above 101 F, pus (white drainage) or increased drainage or redness at the wound, or calf pain, call your surgeon's office.   Change dressing  As directed    Comments:     Change dressing daily with sterile 4 x 4 inch gauze dressing and apply TED hose. Do not submerge the incision under water.   Constipation Prevention  As directed    Comments:     Drink plenty of fluids.  Prune juice may be helpful.  You may use a stool softener, such as Colace (over the counter) 100 mg twice a day.  Use MiraLax (over the counter) for constipation as needed.   Diet - low sodium heart healthy  As directed    Discharge instructions  As directed    Comments:     Pick up stool softner and laxative for home. Do not submerge incision under water. May shower. Continue to use ice for pain and swelling  from surgery. Take Xarelto for two and a half more weeks, then discontinue Xarelto. Once the patient has completed the blood thinner regimen, then take a Baby 81 mg Aspirin daily for four more weeks.   Do not put a pillow under the knee. Place it under the heel.  As directed    Do not sit on low chairs, stoools or toilet seats, as it may be difficult to get up from low surfaces  As directed    Driving restrictions  As directed    Comments:     No driving until released by the physician.   Increase activity slowly as tolerated  As directed    Lifting restrictions  As directed    Comments:     No lifting until released by the physician.   Patient may shower  As directed    Comments:     You may shower without a dressing once there is no drainage.  Do not wash over the wound.  If drainage remains, do not shower until drainage stops.   TED hose  As directed    Comments:     Use stockings (TED hose) for 3 weeks on both leg(s).  You may remove them at night for sleeping.   Weight bearing as tolerated  As directed        Medication List    STOP taking these medications       naproxen sodium 220 MG tablet  Commonly known as:  ANAPROX      TAKE these medications       docusate sodium 100 MG capsule  Commonly known as:  COLACE  Take 100 mg by mouth 2 (two) times daily.  methocarbamol 500 MG tablet  Commonly known as:  ROBAXIN  Take 1 tablet (500 mg total) by mouth every 6 (six) hours as needed.     oxyCODONE 5 MG immediate release tablet  Commonly known as:  Oxy IR/ROXICODONE  Take 1-2 tablets (5-10 mg total) by mouth every 3 (three) hours as needed.     ranitidine 150 MG tablet  Commonly known as:  ZANTAC  Take 150 mg by mouth as needed for heartburn.     rivaroxaban 10 MG Tabs tablet  Commonly known as:  XARELTO  - Take 1 tablet (10 mg total) by mouth daily with breakfast. Take Xarelto for two and a half more weeks, then discontinue Xarelto.  - Once the patient has  completed the blood thinner regimen, then take a Baby 81 mg Aspirin daily for four more weeks.     traMADol 50 MG tablet  Commonly known as:  ULTRAM  Take 1-2 tablets (50-100 mg total) by mouth every 6 (six) hours as needed (mild pain).       Follow-up Information   Follow up with Loanne Drilling, MD. Schedule an appointment as soon as possible for a visit in 2 weeks.   Specialty:  Orthopedic Surgery   Contact information:   [address] [place] [address] [phone]       Signed: Patrica Duel 05/04/2013, 9:25 PM

## 2013-04-20 NOTE — Care Management Note (Signed)
    Page 1 of 2   04/20/2013     4:15:53 PM   CARE MANAGEMENT NOTE 04/20/2013  Patient:  Jake Barnett, Jake Barnett   Account Number:  [id number]  Date Initiated:  04/20/2013  Documentation initiated by:  Colleen Can  Subjective/Objective Assessment:   dx OA left knee; total knee replacemnt     Action/Plan:   CM spoke with patient. Plans are for patient to return to [facility] where spouse will be caregiver. Pt has RW.   Anticipated DC Date:  04/20/2013   Anticipated DC Plan:  HOME W HOME HEALTH SERVICES      DC Planning Services  CM consult      PAC Choice  DURABLE MEDICAL EQUIPMENT  HOME HEALTH   Choice offered to / List presented to:  C-1 Patient   DME arranged  3-N-1      DME agency  [facility]     HH arranged  HH-2 PT      [facility] agency  [facility]   Status of service:  Completed, signed off Medicare Important Message given?  NA - LOS <3 / Initial given by admissions (If response is "NO", the following Medicare IM given date fields will be blank) Date Medicare IM given:   Date Additional Medicare IM given:    Discharge Disposition:  HOME W HOME HEALTH SERVICES  Per UR Regulation:    If discussed at Long Length of Stay Meetings, dates discussed:    Comments:  098/17/2014 Colleen Can BSN RN CCM [phone] [facility] will provide HHpt services with start date 04/21/2013. Pt discharged  today.

## 2013-04-20 NOTE — Evaluation (Signed)
Occupational Therapy Evaluation Patient Details Name: Jake Barnett MRN: [id number] DOB: [birth year] Today's Date: 04/20/2013 Time: 4098-1191 OT Time Calculation (min): 17 min  OT Assessment / Plan / Recommendation History of present illness  TKR   Clinical Impression   Pt presents to OT s/p TKR.  Education complete regarding ADL activity s/p TKR. No further OT needs       Follow Up Recommendations  No OT follow up       Equipment Recommendations  None recommended by OT          Precautions / Restrictions Precautions Precautions: Knee Required Braces or Orthoses: Knee Immobilizer - Left Knee Immobilizer - Left: Discontinue once straight leg raise with < 10 degree lag Restrictions Weight Bearing Restrictions: No       ADL  Upper Body Dressing: Performed;Set up Where Assessed - Upper Body Dressing: Unsupported sitting Lower Body Dressing: Performed;Minimal assistance Where Assessed - Lower Body Dressing: Unsupported sit to stand;Other (comment) (with reacher and sock aid) Toilet Transfer: Research scientist (life sciences) Method: Sit to Barista: Comfort height toilet Toileting - Clothing Manipulation and Hygiene: Performed;Supervision/safety Where Assessed - Engineer, mining and Hygiene: Standing Tub/Shower Transfer: Simulated;Min guard Tub/Shower Transfer Method: Ambulating        Visit Information  Last OT Received On: 04/20/13 Assistance Needed: +1       Prior Functioning     Home Living Family/patient expects to be discharged to:: Private residence Living Arrangements: Spouse/significant other;Children Available Help at Discharge: Family Home Access: Stairs to enter Secretary/administrator of Steps: 1 Home Layout: One level Home Equipment: None Prior Function Level of Independence: Independent Communication Communication: No difficulties         Vision/Perception Vision - History Baseline  Vision: No visual deficits   Cognition  Cognition Arousal/Alertness: Awake/alert Behavior During Therapy: WFL for tasks assessed/performed Overall Cognitive Status: Within Functional Limits for tasks assessed    Extremity/Trunk Assessment Upper Extremity Assessment Upper Extremity Assessment: Overall WFL for tasks assessed     Mobility Transfers Transfers: Sit to Stand;Stand to Sit Sit to Stand: 5: Supervision;From bed;With upper extremity assist;With armrests Stand to Sit: 5: Supervision;To chair/3-in-1;With upper extremity assist;With armrests           End of Session OT - End of Session Activity Tolerance: Patient tolerated treatment well Patient left: in chair;with call bell/phone within reach CPM Left Knee CPM Left Knee: Off  GO     Alba Cory 04/20/2013, 10:15 AM

## 2013-04-20 NOTE — Progress Notes (Signed)
Physical Therapy Treatment Patient Details Name: KHRIZ LIDDY MRN: [id number] DOB: 1941/01/23 Today's Date: 04/20/2013 Time: 2841-3244 PT Time Calculation (min): 32 min  PT Assessment / Plan / Recommendation  History of Present Illness LTKA on 04/1513   PT Comments   Pt is safe to DC to home after instruction in exercises and setp training.  Follow Up Recommendations  Home health PT     Does the patient have the potential to tolerate intense rehabilitation     Barriers to Discharge        Equipment Recommendations       Recommendations for Other Services    Frequency 7X/week   Progress towards PT Goals Progress towards PT goals: Progressing toward goals  Plan Current plan remains appropriate    Precautions / Restrictions Precautions Precautions: Knee Required Braces or Orthoses: Knee Immobilizer - Left Knee Immobilizer - Left: Discontinue once straight leg raise with < 10 degree lag Restrictions Weight Bearing Restrictions: No   Pertinent Vitals/Pain 2 L knee.    Mobility  Bed Mobility Supine to Sit: 4: Min guard Details for Bed Mobility Assistance: verbal cues to use R foot under L to move to edge and place Leg onto bed and for lowering LLE to floor. Also instructed in use of Sheet arouns L fooot to slide and support. Transfers Sit to Stand: 5: Supervision;From bed;With upper extremity assist;With armrests Stand to Sit: 5: Supervision;To chair/3-in-1;With upper extremity assist;With armrests Details for Transfer Assistance: verbal cues for UE use and LLE position prior to sitting/ cues for safety to tuur completely before sitting down. Ambulation/Gait Ambulation/Gait Assistance: 5: Supervision Ambulation Distance (Feet): 100 Feet Assistive device: Rolling walker Ambulation/Gait Assistance Details: multimodal cues for sequence, decreasing L step length and increasing R step lenght. Gait Pattern: Step-to pattern;Step-through pattern Stairs: Yes Stairs Assistance: 4:  Min assist Stairs Assistance Details (indicate cue type and reason): wife present for instruction Stair Management Technique: No rails;Step to pattern;Backwards;With walker Number of Stairs: 1    Exercises Total Joint Exercises Ankle Circles/Pumps: AROM;Supine Quad Sets: AROM;Left;10 reps;Seated;Supine Heel Slides: AAROM;Left;10 reps;Supine Straight Leg Raises: AAROM;Left;10 reps;Supine Knee Flexion:  (visual instruction in this exercise.) Goniometric ROM: 10-45   PT Diagnosis:    PT Problem List:   PT Treatment Interventions:     PT Goals (current goals can now be found in the care plan section)    Visit Information  Last PT Received On: 04/20/13 Assistance Needed: +1 History of Present Illness: LTKA on 04/1513    Subjective Data      Cognition  Cognition Arousal/Alertness: Awake/alert Behavior During Therapy: [facility] for tasks assessed/performed Overall Cognitive Status: Within Functional Limits for tasks assessed    Balance     End of Session PT - End of Session Activity Tolerance: Patient tolerated treatment well Patient left: in chair;with call bell/phone within reach;with family/visitor present Nurse Communication: Mobility status CPM Left Knee CPM Left Knee: Off   GP     Rada Hay 04/20/2013, 11:28 AM

## 2013-04-20 NOTE — Progress Notes (Signed)
   Subjective: 2 Days Post-Op Procedure(s) (LRB): LEFT TOTAL KNEE ARTHROPLASTY (Left) Patient reports pain as mild.   Patient seen in rounds with Dr. Lequita Halt. Patient is well, and has had no acute complaints or problems Patient is ready to go home.  Doing well.  Objective: Vital signs in last 24 hours: Temp:  [97.9 F (36.6 C)-98.4 F (36.9 C)] 98.4 F (36.9 C) (09/17 0625) Pulse Rate:  [72-75] 75 (09/17 0625) Resp:  [14-18] 16 (09/17 0625) BP: (125-149)/(67-79) 149/79 mmHg (09/17 0625) SpO2:  [96 %] 96 % (09/17 0625)  Intake/Output from previous day:  Intake/Output Summary (Last 24 hours) at 04/20/13 0719 Last data filed at 04/20/13 0500  Gross per 24 hour  Intake      0 ml  Output   2100 ml  Net  -2100 ml    Intake/Output this shift:    Labs:  Recent Labs  04/19/13 0503 04/20/13 0443  HGB 12.8* 12.0*    Recent Labs  04/19/13 0503 04/20/13 0443  WBC 14.4* 13.6*  RBC 4.40 4.14*  HCT 37.3* 35.2*  PLT 209 192    Recent Labs  04/19/13 0503 04/20/13 0443  NA 135 138  K 4.1 3.6  CL 100 102  CO2 27 30  BUN 11 12  CREATININE 0.87 0.98  GLUCOSE 143* 125*  CALCIUM 8.3* 8.5   No results found for this basename: LABPT, INR,  in the last 72 hours  EXAM: General - Patient is Alert, Appropriate and Oriented Extremity - Neurovascular intact Sensation intact distally Dorsiflexion/Plantar flexion intact Incision - clean, dry, no drainage, healing Motor Function - intact, moving foot and toes well on exam.   Assessment/Plan: 2 Days Post-Op Procedure(s) (LRB): LEFT TOTAL KNEE ARTHROPLASTY (Left) Procedure(s) (LRB): LEFT TOTAL KNEE ARTHROPLASTY (Left) Past Medical History  Diagnosis Date  . GERD (gastroesophageal reflux disease)   . Palpitations     a. monitoring 2009 - occasional PAC/PVC.  Marland Kitchen Dyspnea   . Hyperlipidemia   . Diastolic dysfunction     Noted per stress echo in 2010  . Arthritis   . Chest pain     a. 12/2008 nl stress echo.;  b. 02/2011  nl myoview, EF 66%.  . H/O hiatal hernia   . History of kidney stones    Principal Problem:   OA (osteoarthritis) of knee Active Problems:   GERD (gastroesophageal reflux disease)   Postoperative anemia due to acute blood loss  Estimated body mass index is 30.08 kg/(m^2) as calculated from the following:   Height as of this encounter: 5' 10.08" (1.78 m).   Weight as of this encounter: 95.3 kg (210 lb 1.6 oz). Up with therapy Discharge home with home health Diet - Regular diet Follow up - in 2 weeks Activity - WBAT Disposition - Home Condition Upon Discharge - Good D/C Meds - See DC Summary DVT Prophylaxis - Xarelto  PERKINS, ALEXZANDREW 04/20/2013, 7:19 AM

## 2013-04-21 DIAGNOSIS — M159 Polyosteoarthritis, unspecified: Secondary | ICD-10-CM | POA: Diagnosis not present

## 2013-04-21 DIAGNOSIS — IMO0001 Reserved for inherently not codable concepts without codable children: Secondary | ICD-10-CM | POA: Diagnosis not present

## 2013-04-21 DIAGNOSIS — Z471 Aftercare following joint replacement surgery: Secondary | ICD-10-CM | POA: Diagnosis not present

## 2013-04-21 DIAGNOSIS — Z96659 Presence of unspecified artificial knee joint: Secondary | ICD-10-CM | POA: Diagnosis not present

## 2013-04-22 DIAGNOSIS — M159 Polyosteoarthritis, unspecified: Secondary | ICD-10-CM | POA: Diagnosis not present

## 2013-04-22 DIAGNOSIS — Z471 Aftercare following joint replacement surgery: Secondary | ICD-10-CM | POA: Diagnosis not present

## 2013-04-22 DIAGNOSIS — Z96659 Presence of unspecified artificial knee joint: Secondary | ICD-10-CM | POA: Diagnosis not present

## 2013-04-22 DIAGNOSIS — IMO0001 Reserved for inherently not codable concepts without codable children: Secondary | ICD-10-CM | POA: Diagnosis not present

## 2013-04-23 DIAGNOSIS — IMO0001 Reserved for inherently not codable concepts without codable children: Secondary | ICD-10-CM | POA: Diagnosis not present

## 2013-04-23 DIAGNOSIS — M159 Polyosteoarthritis, unspecified: Secondary | ICD-10-CM | POA: Diagnosis not present

## 2013-04-23 DIAGNOSIS — Z96659 Presence of unspecified artificial knee joint: Secondary | ICD-10-CM | POA: Diagnosis not present

## 2013-04-23 DIAGNOSIS — Z471 Aftercare following joint replacement surgery: Secondary | ICD-10-CM | POA: Diagnosis not present

## 2013-04-25 DIAGNOSIS — IMO0001 Reserved for inherently not codable concepts without codable children: Secondary | ICD-10-CM | POA: Diagnosis not present

## 2013-04-25 DIAGNOSIS — M159 Polyosteoarthritis, unspecified: Secondary | ICD-10-CM | POA: Diagnosis not present

## 2013-04-25 DIAGNOSIS — Z471 Aftercare following joint replacement surgery: Secondary | ICD-10-CM | POA: Diagnosis not present

## 2013-04-25 DIAGNOSIS — Z96659 Presence of unspecified artificial knee joint: Secondary | ICD-10-CM | POA: Diagnosis not present

## 2013-04-26 DIAGNOSIS — IMO0001 Reserved for inherently not codable concepts without codable children: Secondary | ICD-10-CM | POA: Diagnosis not present

## 2013-04-26 DIAGNOSIS — M159 Polyosteoarthritis, unspecified: Secondary | ICD-10-CM | POA: Diagnosis not present

## 2013-04-26 DIAGNOSIS — Z96659 Presence of unspecified artificial knee joint: Secondary | ICD-10-CM | POA: Diagnosis not present

## 2013-04-26 DIAGNOSIS — Z471 Aftercare following joint replacement surgery: Secondary | ICD-10-CM | POA: Diagnosis not present

## 2013-04-27 DIAGNOSIS — Z471 Aftercare following joint replacement surgery: Secondary | ICD-10-CM | POA: Diagnosis not present

## 2013-04-27 DIAGNOSIS — Z96659 Presence of unspecified artificial knee joint: Secondary | ICD-10-CM | POA: Diagnosis not present

## 2013-04-27 DIAGNOSIS — IMO0001 Reserved for inherently not codable concepts without codable children: Secondary | ICD-10-CM | POA: Diagnosis not present

## 2013-04-27 DIAGNOSIS — M159 Polyosteoarthritis, unspecified: Secondary | ICD-10-CM | POA: Diagnosis not present

## 2013-04-28 DIAGNOSIS — Z471 Aftercare following joint replacement surgery: Secondary | ICD-10-CM | POA: Diagnosis not present

## 2013-04-28 DIAGNOSIS — M159 Polyosteoarthritis, unspecified: Secondary | ICD-10-CM | POA: Diagnosis not present

## 2013-04-28 DIAGNOSIS — Z96659 Presence of unspecified artificial knee joint: Secondary | ICD-10-CM | POA: Diagnosis not present

## 2013-04-28 DIAGNOSIS — IMO0001 Reserved for inherently not codable concepts without codable children: Secondary | ICD-10-CM | POA: Diagnosis not present

## 2013-04-29 DIAGNOSIS — M159 Polyosteoarthritis, unspecified: Secondary | ICD-10-CM | POA: Diagnosis not present

## 2013-04-29 DIAGNOSIS — Z96659 Presence of unspecified artificial knee joint: Secondary | ICD-10-CM | POA: Diagnosis not present

## 2013-04-29 DIAGNOSIS — IMO0001 Reserved for inherently not codable concepts without codable children: Secondary | ICD-10-CM | POA: Diagnosis not present

## 2013-04-29 DIAGNOSIS — Z471 Aftercare following joint replacement surgery: Secondary | ICD-10-CM | POA: Diagnosis not present

## 2013-05-02 DIAGNOSIS — M159 Polyosteoarthritis, unspecified: Secondary | ICD-10-CM | POA: Diagnosis not present

## 2013-05-02 DIAGNOSIS — IMO0001 Reserved for inherently not codable concepts without codable children: Secondary | ICD-10-CM | POA: Diagnosis not present

## 2013-05-02 DIAGNOSIS — Z471 Aftercare following joint replacement surgery: Secondary | ICD-10-CM | POA: Diagnosis not present

## 2013-05-02 DIAGNOSIS — Z96659 Presence of unspecified artificial knee joint: Secondary | ICD-10-CM | POA: Diagnosis not present

## 2013-05-05 DIAGNOSIS — IMO0001 Reserved for inherently not codable concepts without codable children: Secondary | ICD-10-CM | POA: Diagnosis not present

## 2013-05-05 DIAGNOSIS — Z96659 Presence of unspecified artificial knee joint: Secondary | ICD-10-CM | POA: Diagnosis not present

## 2013-05-05 DIAGNOSIS — Z471 Aftercare following joint replacement surgery: Secondary | ICD-10-CM | POA: Diagnosis not present

## 2013-05-05 DIAGNOSIS — M159 Polyosteoarthritis, unspecified: Secondary | ICD-10-CM | POA: Diagnosis not present

## 2013-05-06 DIAGNOSIS — Z471 Aftercare following joint replacement surgery: Secondary | ICD-10-CM | POA: Diagnosis not present

## 2013-05-06 DIAGNOSIS — Z96659 Presence of unspecified artificial knee joint: Secondary | ICD-10-CM | POA: Diagnosis not present

## 2013-05-06 DIAGNOSIS — IMO0001 Reserved for inherently not codable concepts without codable children: Secondary | ICD-10-CM | POA: Diagnosis not present

## 2013-05-06 DIAGNOSIS — M159 Polyosteoarthritis, unspecified: Secondary | ICD-10-CM | POA: Diagnosis not present

## 2013-05-09 ENCOUNTER — Ambulatory Visit: Payer: Medicare Other | Attending: Orthopedic Surgery | Admitting: Physical Therapy

## 2013-05-09 DIAGNOSIS — R5381 Other malaise: Secondary | ICD-10-CM | POA: Diagnosis not present

## 2013-05-09 DIAGNOSIS — IMO0001 Reserved for inherently not codable concepts without codable children: Secondary | ICD-10-CM | POA: Insufficient documentation

## 2013-05-09 DIAGNOSIS — M25569 Pain in unspecified knee: Secondary | ICD-10-CM | POA: Diagnosis not present

## 2013-05-09 DIAGNOSIS — Z96659 Presence of unspecified artificial knee joint: Secondary | ICD-10-CM | POA: Diagnosis not present

## 2013-05-09 DIAGNOSIS — M25669 Stiffness of unspecified knee, not elsewhere classified: Secondary | ICD-10-CM | POA: Diagnosis not present

## 2013-05-10 ENCOUNTER — Encounter (HOSPITAL_COMMUNITY): Payer: Self-pay | Admitting: Pharmacy Technician

## 2013-05-10 ENCOUNTER — Other Ambulatory Visit: Payer: Self-pay | Admitting: Urology

## 2013-05-10 DIAGNOSIS — R3129 Other microscopic hematuria: Secondary | ICD-10-CM | POA: Diagnosis not present

## 2013-05-10 DIAGNOSIS — N201 Calculus of ureter: Secondary | ICD-10-CM | POA: Diagnosis not present

## 2013-05-10 DIAGNOSIS — N200 Calculus of kidney: Secondary | ICD-10-CM | POA: Diagnosis not present

## 2013-05-10 DIAGNOSIS — N23 Unspecified renal colic: Secondary | ICD-10-CM | POA: Diagnosis not present

## 2013-05-11 ENCOUNTER — Encounter (HOSPITAL_COMMUNITY): Payer: Self-pay | Admitting: *Deleted

## 2013-05-11 ENCOUNTER — Encounter (HOSPITAL_BASED_OUTPATIENT_CLINIC_OR_DEPARTMENT_OTHER): Payer: Self-pay | Admitting: *Deleted

## 2013-05-11 ENCOUNTER — Ambulatory Visit: Payer: Medicare Other

## 2013-05-11 NOTE — Progress Notes (Addendum)
SPOKE W/ PT WIFE. NPO AFTER MN. ARRIVE AT 1045. NEEDS HG. CURRENT EKG IN EPIC AND CHART. WILL TAKE PREDNISONE, ZANTAC, COLCHICINE, AM DOS W/ SIPS OF WATER AND IF NEEDED MAY TAKE HYDROCODONE.  PT STOPPED ASA  AS INSTRUCTED ,  HE CLEARED THIS W/ DR ALUSIO DUE TO RECENT KNEE REPLACEMENT SEPT 2014.

## 2013-05-13 ENCOUNTER — Encounter (HOSPITAL_BASED_OUTPATIENT_CLINIC_OR_DEPARTMENT_OTHER): Payer: Self-pay

## 2013-05-13 ENCOUNTER — Ambulatory Visit (HOSPITAL_COMMUNITY): Payer: Medicare Other

## 2013-05-13 ENCOUNTER — Encounter (HOSPITAL_BASED_OUTPATIENT_CLINIC_OR_DEPARTMENT_OTHER): Payer: Medicare Other | Admitting: Anesthesiology

## 2013-05-13 ENCOUNTER — Encounter (HOSPITAL_BASED_OUTPATIENT_CLINIC_OR_DEPARTMENT_OTHER)
Admission: RE | Disposition: A | Discharge status: Home / Self Care | Payer: Self-pay | Source: Ambulatory Visit | Attending: Urology

## 2013-05-13 ENCOUNTER — Ambulatory Visit (HOSPITAL_BASED_OUTPATIENT_CLINIC_OR_DEPARTMENT_OTHER): Payer: Medicare Other | Admitting: Anesthesiology

## 2013-05-13 ENCOUNTER — Ambulatory Visit (HOSPITAL_BASED_OUTPATIENT_CLINIC_OR_DEPARTMENT_OTHER)
Admission: RE | Admit: 2013-05-13 | Discharge: 2013-05-13 | Disposition: A | Discharge status: Home / Self Care | Payer: Medicare Other | Source: Ambulatory Visit | Attending: Urology | Admitting: Urology

## 2013-05-13 DIAGNOSIS — K219 Gastro-esophageal reflux disease without esophagitis: Secondary | ICD-10-CM | POA: Diagnosis not present

## 2013-05-13 DIAGNOSIS — N201 Calculus of ureter: Secondary | ICD-10-CM

## 2013-05-13 DIAGNOSIS — N1330 Unspecified hydronephrosis: Secondary | ICD-10-CM | POA: Insufficient documentation

## 2013-05-13 DIAGNOSIS — R3129 Other microscopic hematuria: Secondary | ICD-10-CM | POA: Diagnosis not present

## 2013-05-13 DIAGNOSIS — Z79899 Other long term (current) drug therapy: Secondary | ICD-10-CM | POA: Insufficient documentation

## 2013-05-13 DIAGNOSIS — N200 Calculus of kidney: Secondary | ICD-10-CM | POA: Insufficient documentation

## 2013-05-13 DIAGNOSIS — Z96659 Presence of unspecified artificial knee joint: Secondary | ICD-10-CM | POA: Insufficient documentation

## 2013-05-13 HISTORY — DX: Unspecified right bundle-branch block: I45.10

## 2013-05-13 HISTORY — PX: CYSTOSCOPY W/ URETERAL STENT PLACEMENT: SHX1429

## 2013-05-13 HISTORY — DX: Other specified postprocedural states: R11.2

## 2013-05-13 HISTORY — DX: Heart disease, unspecified: I51.9

## 2013-05-13 HISTORY — DX: Calculus of ureter: N20.1

## 2013-05-13 HISTORY — DX: Other specified postprocedural states: Z98.890

## 2013-05-13 LAB — POCT HEMOGLOBIN-HEMACUE: Hemoglobin: 11.6 g/dL — ABNORMAL LOW (ref 13.0–17.0)

## 2013-05-13 SURGERY — CYSTOSCOPY, WITH RETROGRADE PYELOGRAM AND URETERAL STENT INSERTION
Anesthesia: General | Site: Ureter | Laterality: Left | Wound class: Clean Contaminated

## 2013-05-13 MED ORDER — SODIUM CHLORIDE 0.9 % IR SOLN
Status: DC | PRN
  Administered 2013-05-13: 3000 mL

## 2013-05-13 MED ORDER — PROPOFOL 10 MG/ML IV BOLUS
INTRAVENOUS | Status: DC | PRN
  Administered 2013-05-13: 170 mg via INTRAVENOUS

## 2013-05-13 MED ORDER — BELLADONNA ALKALOIDS-OPIUM 16.2-60 MG RE SUPP
RECTAL | Status: DC | PRN
  Administered 2013-05-13: 1 via RECTAL

## 2013-05-13 MED ORDER — DEXAMETHASONE SODIUM PHOSPHATE 4 MG/ML IJ SOLN
INTRAMUSCULAR | Status: DC | PRN
  Administered 2013-05-13: 8 mg via INTRAVENOUS

## 2013-05-13 MED ORDER — FENTANYL CITRATE 0.05 MG/ML IJ SOLN
INTRAMUSCULAR | Status: DC | PRN
  Administered 2013-05-13: 75 ug via INTRAVENOUS
  Administered 2013-05-13: 25 ug via INTRAVENOUS

## 2013-05-13 MED ORDER — FENTANYL CITRATE 0.05 MG/ML IJ SOLN
25.0000 ug | INTRAMUSCULAR | Status: DC | PRN
  Administered 2013-05-13: 25 ug via INTRAVENOUS
  Filled 2013-05-13: qty 0.5

## 2013-05-13 MED ORDER — LIDOCAINE HCL (CARDIAC) 20 MG/ML IV SOLN
INTRAVENOUS | Status: DC | PRN
  Administered 2013-05-13: 60 mg via INTRAVENOUS

## 2013-05-13 MED ORDER — CEFAZOLIN SODIUM 1-5 GM-% IV SOLN
1.0000 g | INTRAVENOUS | Status: DC
  Filled 2013-05-13: qty 50

## 2013-05-13 MED ORDER — CEFAZOLIN SODIUM-DEXTROSE 2-3 GM-% IV SOLR
2.0000 g | INTRAVENOUS | Status: AC
  Administered 2013-05-13: 2 g via INTRAVENOUS
  Filled 2013-05-13: qty 50

## 2013-05-13 MED ORDER — PROMETHAZINE HCL 25 MG/ML IJ SOLN
6.2500 mg | INTRAMUSCULAR | Status: DC | PRN
  Filled 2013-05-13: qty 1

## 2013-05-13 MED ORDER — LACTATED RINGERS IV SOLN
INTRAVENOUS | Status: DC
  Administered 2013-05-13: 11:00:00 via INTRAVENOUS
  Filled 2013-05-13: qty 1000

## 2013-05-13 MED ORDER — LIDOCAINE HCL 2 % EX GEL
CUTANEOUS | Status: DC | PRN
  Administered 2013-05-13: 1 via URETHRAL

## 2013-05-13 MED ORDER — FENTANYL CITRATE 0.05 MG/ML IJ SOLN
25.0000 ug | INTRAMUSCULAR | Status: DC | PRN
  Filled 2013-05-13: qty 1

## 2013-05-13 MED ORDER — ONDANSETRON HCL 4 MG/2ML IJ SOLN
INTRAMUSCULAR | Status: DC | PRN
  Administered 2013-05-13: 4 mg via INTRAMUSCULAR

## 2013-05-13 MED ORDER — IOHEXOL 350 MG/ML SOLN
INTRAVENOUS | Status: DC | PRN
  Administered 2013-05-13: 7 mL

## 2013-05-13 SURGICAL SUPPLY — 31 items
ADAPTER CATH URET PLST 4-6FR (CATHETERS) IMPLANT
ADPR CATH URET STRL DISP 4-6FR (CATHETERS)
APL SKNCLS STERI-STRIP NONHPOA (GAUZE/BANDAGES/DRESSINGS)
BAG DRAIN URO-CYSTO SKYTR STRL (DRAIN) ×2 IMPLANT
BAG DRN UROCATH (DRAIN) ×1
BENZOIN TINCTURE PRP APPL 2/3 (GAUZE/BANDAGES/DRESSINGS) IMPLANT
CANISTER SUCT LVC 12 LTR MEDI- (MISCELLANEOUS) ×1 IMPLANT
CATH INTERMIT  6FR 70CM (CATHETERS) IMPLANT
CATH URET 5FR 28IN CONE TIP (BALLOONS)
CATH URET 5FR 28IN OPEN ENDED (CATHETERS) ×1 IMPLANT
CATH URET 5FR 70CM CONE TIP (BALLOONS) IMPLANT
CLOTH BEACON ORANGE TIMEOUT ST (SAFETY) ×2 IMPLANT
DRAPE CAMERA CLOSED 9X96 (DRAPES) ×2 IMPLANT
DRSG TEGADERM 2-3/8X2-3/4 SM (GAUZE/BANDAGES/DRESSINGS) IMPLANT
GLOVE BIO SURGEON STRL SZ7.5 (GLOVE) ×2 IMPLANT
GLOVE BIOGEL M STER SZ 6 (GLOVE) ×1 IMPLANT
GLOVE BIOGEL PI IND STRL 6.5 (GLOVE) IMPLANT
GLOVE BIOGEL PI INDICATOR 6.5 (GLOVE) ×1
GOWN STRL REIN XL XLG (GOWN DISPOSABLE) ×2 IMPLANT
GUIDEWIRE 0.038 PTFE COATED (WIRE) IMPLANT
GUIDEWIRE ANG ZIPWIRE 038X150 (WIRE) IMPLANT
GUIDEWIRE STR DUAL SENSOR (WIRE) IMPLANT
KIT BALLIN UROMAX 15FX10 (LABEL) IMPLANT
KIT BALLN UROMAX 15FX4 (MISCELLANEOUS) IMPLANT
KIT BALLN UROMAX 26 75X4 (MISCELLANEOUS)
NS IRRIG 500ML POUR BTL (IV SOLUTION) IMPLANT
PACK CYSTOSCOPY (CUSTOM PROCEDURE TRAY) ×2 IMPLANT
SET HIGH PRES BAL DIL (LABEL)
SHEATH URET ACCESS 12FR/35CM (UROLOGICAL SUPPLIES) IMPLANT
SHEATH URET ACCESS 12FR/55CM (UROLOGICAL SUPPLIES) IMPLANT
STENT URET 6FRX24 CONTOUR (STENTS) ×1 IMPLANT

## 2013-05-13 NOTE — Anesthesia Postprocedure Evaluation (Signed)
  Anesthesia Post-op Note  Patient: Jake Barnett  Procedure(s) Performed: Procedure(s) (LRB): CYSTOSCOPY WITH RETROGRADE PYELOGRAM/URETERAL STENT PLACEMENT (Left)  Patient Location: PACU  Anesthesia Type: General  Level of Consciousness: awake and alert   Airway and Oxygen Therapy: Patient Spontanous Breathing  Post-op Pain: mild  Post-op Assessment: Post-op Vital signs reviewed, Patient's Cardiovascular Status Stable, Respiratory Function Stable, Patent Airway and No signs of Nausea or vomiting  Last Vitals:  Filed Vitals:   05/13/13 1330  BP: 146/71  Pulse: 64  Temp:   Resp: 17    Post-op Vital Signs: stable   Complications: No apparent anesthesia complications

## 2013-05-13 NOTE — Transfer of Care (Signed)
Immediate Anesthesia Transfer of Care Note  Patient: Jake Barnett  Procedure(s) Performed: Procedure(s) (LRB): CYSTOSCOPY WITH RETROGRADE PYELOGRAM/URETERAL STENT PLACEMENT (Left)  Patient Location: PACU  Anesthesia Type: General  Level of Consciousness: awake, oriented, sedated and patient cooperative  Airway & Oxygen Therapy: Patient Spontanous Breathing and Patient connected to face mask oxygen  Post-op Assessment: Report given to PACU RN and Post -op Vital signs reviewed and stable  Post vital signs: Reviewed and stable  Complications: No apparent anesthesia complications

## 2013-05-13 NOTE — Anesthesia Procedure Notes (Signed)
Procedure Name: LMA Insertion Date/Time: 05/13/2013 12:41 PM Performed by: Renella Cunas D Pre-anesthesia Checklist: Patient identified, Emergency Drugs available, Suction available and Patient being monitored Patient Re-evaluated:Patient Re-evaluated prior to inductionOxygen Delivery Method: Circle System Utilized Preoxygenation: Pre-oxygenation with 100% oxygen Intubation Type: IV induction Ventilation: Mask ventilation without difficulty LMA: LMA inserted LMA Size: 4.0 Number of attempts: 1 Airway Equipment and Method: bite block Placement Confirmation: positive ETCO2 Tube secured with: Tape Dental Injury: Teeth and Oropharynx as per pre-operative assessment

## 2013-05-13 NOTE — Op Note (Signed)
Preoperative diagnosis: Left proximal ureteral calculus Postoperative diagnosis: Same  Procedure: Cystoscopy, left retrograde pyelogram, left JJ stent placement 24 cm 6 French   Surgeon: Valetta Fuller M.D.  Anesthesia: Gen.  Indications: Jake Barnett recently presented with significant left flank pain. A large left renal calculus that had been observed and moved to the proximal left ureter and was causing severe left hydronephrosis. The patient was temporarily put on the schedule for ESWL but because of ongoing discomfort is now having placement of a double-J stent to alleviate the obstruction of prior to definitive treatment.     Technique and findings: Patient was brought the operating room where he had successful induction general anesthesia. Was lithotomy position prepped and draped usual manner. Patient had placement of compression boots and received perioperative antibiotics. Appropriate surgical timeout was performed. Cystoscopy revealed unremarkable anterior urethra and prostatic urethra. There was mild trilobar hyperplasia. The bladder itself is decompressed and showed no pathology. Retrograde pyelogram was done with an open-ended catheter. A fluoroscopic interpretation there was a large filling defect in the left proximal ureter causing high-grade obstruction. With additional injection of contrast the stone did migrate proximally into the kidney. A guidewire was placed. Over the guidewire with fluoroscopic and visual guidance we placed a 6 French 24 cm double-J stent without difficulty. The patient was brought to recovery room stable condition having had no obvious complications or problems.

## 2013-05-13 NOTE — Anesthesia Preprocedure Evaluation (Addendum)
Anesthesia Evaluation  Patient identified by MRN, date of birth, ID band Patient awake    Reviewed: Allergy & Precautions, H&P , NPO status , Patient's Chart, lab work & pertinent test results  History of Anesthesia Complications (+) PONV and history of anesthetic complications  Airway Mallampati: II TM Distance: >3 FB Neck ROM: Full    Dental no notable dental hx.    Pulmonary shortness of breath,  breath sounds clear to auscultation  Pulmonary exam normal       Cardiovascular Exercise Tolerance: Good hypertension, negative cardio ROS  + dysrhythmias Rhythm:Regular Rate:Normal  ECG: NSR, RBBB  H/O palpitations. H/O diastolic dysfunction.  Clearance cardiology 04-05-13 for tka.   Neuro/Psych negative neurological ROS  negative psych ROS   GI/Hepatic Neg liver ROS, hiatal hernia, GERD-  Medicated,  Endo/Other  negative endocrine ROS  Renal/GU negative Renal ROS  negative genitourinary   Musculoskeletal negative musculoskeletal ROS (+)   Abdominal   Peds negative pediatric ROS (+)  Hematology negative hematology ROS (+)   Anesthesia Other Findings   Reproductive/Obstetrics negative OB ROS                          Anesthesia Physical Anesthesia Plan  ASA: II  Anesthesia Plan: General   Post-op Pain Management:    Induction: Intravenous  Airway Management Planned: LMA  Additional Equipment:   Intra-op Plan:   Post-operative Plan: Extubation in OR  Informed Consent: I have reviewed the patients History and Physical, chart, labs and discussed the procedure including the risks, benefits and alternatives for the proposed anesthesia with the patient or authorized representative who has indicated his/her understanding and acceptance.   Dental advisory given  Plan Discussed with: CRNA  Anesthesia Plan Comments:         Anesthesia Quick Evaluation

## 2013-05-13 NOTE — H&P (Signed)
Reason For Visit         Jake Barnett  presents today as an acute work in. He developed some fairly significant left-sided flank and abdominal discomfort yesterday evening. This radiated across his abdomen inserted by history is very suspicious for renal colic. He has also had some increased bladder overactivity with increased bladder pressure urgency and frequency. No gross hematuria. Urinalysis today however shows too numerous to count red blood cells. Several months ago his urinalysis was clear despite his bilateral stone burden. He is approximately 3 weeks out from the surgery that went well. He has had no other systemic complaints other than some mild nausea.   History of Present Illness             Jake Barnett returns for routine follow-up visit.  The patient had bilateral simultaneous ureteral calculi which required treatment ( October of 2012).  Stents were subsequently removed and he did well clinically.  The patient was known to have some bilateral renal calculi.  At that time on CT he was thought to have a nonobstructing 3 mm stone in his right kidney and approximately a 9 mm stone in his left kidney.  In retrospect at looking at some of the additional images, that stone in his left kidney appears larger than that.     The patient did undergo KUB imaging in May of 2013.  Again, there are multiple bilateral stones.  The largest on the right appears to be more like 5 mm in size on KUB.  There is more stone burden on the left. In the lower pole he has a stone which measures up to 15 mm.  When I reviewed this relative to his CT scan, it does not look like there has been much change in the number of stones or their location since his last visit.    KUB and renal ultrasound were performed again in June of 2014. Renal ultrasonography demonstrates 2 probable small stones in the right kidney without hydronephrosis or solid renal mass. The left kidney shows multiple calcifications as large as 1-2 cm. Again no  hydronephrosis or solid renal masses. Full measurements of each stone as well as the renal measurements recorded on the worksheet. Of note posterior residuals of 161 mL today. On KUB today the patient does have 2 discrete stones in the right kidney in the 2-4 mm size range. In the left kidney there are 4-5 discrete stones. The largest is in the lower pole it is approximately 17-18 mm. The number and size of the stones is relatively unchanged relative to 6 months ago with the exception of one new stone in the lower pole was left kidney.         Past Medical History Problems  1. History of  Arthritis V13.4 2. History of  Gastric Ulcer 531.90 3. History of  Gout 274.9 4. History of  Heartburn 787.1  Surgical History Problems  1. History of  Cystoscopy With Insertion Of Ureteral Stent Bilateral 2. History of  Cystoscopy With Ureteroscopy With Lithotripsy 3. History of  Ear Surgery 4. History of  Knee Replacement Left 5. History of  Knee Surgery 6. History of  Neck Surgery 7. History of  Shoulder Surgery  Current Meds 1. Cialis 5 MG Oral Tablet; Take 1 tablet daily; Therapy: 05Dec2013 to (Evaluate:03Jul2014); Last  Rx:05Dec2013 2. Colcrys TABS; Therapy: (Recorded:07Oct2014) to 3. Methocarbamol 500 MG Oral Tablet; Therapy: (Recorded:07Oct2014) to 4. MethylPREDNISolone 4 MG Oral Tablet; Therapy: (Recorded:07Oct2014) to 5. Omeprazole TBEC;   1 per day; Therapy: (Recorded:29Oct2012) to 6. TraMADol HCl 50 MG Oral Tablet; Therapy: (Recorded:07Oct2014) to 7. Vitamins/Minerals TABS; Therapy: (Recorded:29Nov2012) to  Allergies Medication  1. Indocin CAPS  Family History Problems  1. Maternal history of  Death In The Family Mother 85yrs 2. Paternal history of  Family Health Status - Father's Age 92yrs 3. Family history of  Family Health Status Number Of Children 1 daughter 4. Paternal history of  Nephrolithiasis  Social History Problems  1. Caffeine Use 3 qd 2. Marital History -  Currently Married 3. Never A Smoker 4. Occupation: retired Denied  5. History of  Alcohol Use 6. History of  Tobacco Use  Review of Systems Genitourinary, constitutional, skin, eye, otolaryngeal, hematologic/lymphatic, cardiovascular, pulmonary, endocrine, musculoskeletal, gastrointestinal, neurological and psychiatric system(s) were reviewed and pertinent findings if present are noted.  Genitourinary: urinary frequency, feelings of urinary urgency, incomplete emptying of bladder and suprapubic pain, but urine stream is not weak and no hematuria.  Gastrointestinal: nausea, flank pain, abdominal pain and heartburn.  Constitutional: feeling tired (fatigue).  Musculoskeletal: joint pain.    Vitals Vital Signs [Data Includes: Last 1 Day]  07Oct2014 09:48AM  Blood Pressure: 139 / 80 Temperature: 96.5 F Heart Rate: 88  Physical Exam Constitutional:. No acute distress.  Pulmonary: No respiratory distress and normal respiratory rhythm and effort.  Cardiovascular: Heart rate and rhythm are normal . No peripheral edema.  Abdomen: The abdomen is soft and nontender. No masses are palpated. No CVA tenderness. No hernias are palpable. No hepatosplenomegaly noted.  Skin: Normal skin turgor, no visible rash and no visible skin lesions.  Neuro/Psych:. Mood and affect are appropriate.    Results/Data Urine [Data Includes: Last 1 Day]   07Oct2014  COLOR AMBER   APPEARANCE CLOUDY   SPECIFIC GRAVITY 1.020   pH 6.5   GLUCOSE NEG mg/dL  BILIRUBIN NEG   KETONE NEG mg/dL  BLOOD LARGE   PROTEIN TRACE mg/dL  UROBILINOGEN 0.2 mg/dL  NITRITE NEG   LEUKOCYTE ESTERASE NEG   SQUAMOUS EPITHELIAL/HPF RARE   WBC 0-2 WBC/hpf  RBC TNTC RBC/hpf  BACTERIA FEW   CRYSTALS NONE SEEN   CASTS NONE SEEN    Assessment Assessed  1. Renal Colic 788.0 2. Microscopic Hematuria 599.72 3. Nephrolithiasis 592.0 4. Ureteral Stone 592.1  Plan Health Maintenance (V70.0)  1. UA With REFLEX  Done: 07Oct2014  09:38AM Nephrolithiasis (592.0)  2. AU CT-STONE PROTOCOL  Done: 07Oct2014 12:00AM Ureteral Stone (592.1)  3. Hydrocodone-Acetaminophen 5-325 MG Oral Tablet; Take 1-2 tablets every 4-6 hours for pain;  Therapy: 07Oct2014 to (Last Rx:07Oct2014) 4. Follow-up Schedule Surgery Office  Follow-up  Requested for: 07Oct2014  Discussion/Summary   Jake Barnett underwent CT protocol imaging today. The official report remains pending, but I have reviewed his images. The larger stone that he had in the left kidney is now unfortunately within the proximal ureter and causing at least moderate hydronephrosis. This stones measures 9 mm x 11 mm in diameter, but based on previous KUB, may be as long as 15 or 16 mm in length. Hounsfield units are fairly high. He has had lithotripsy in the past and was able to eventually pass the fragments. Obviously this stone does represent a fair amount of stone burden and does appear to be fairly dense. Other treatment options would be ureteroscopy or percutaneous approach. Given its current location in the proximal ureter, I believe the treatment of choice should be an attempt at ESWL with the understanding that it is fairly likely that   additional treatment may be required with either repeat ESWL or potentially ureteroscopy and/or stent placement to help facilitate the passage of what is going to be a fair number of fragments/stone burden. He has recently stopped Xarelto. We will need to check to determine the amount of time he needs to be off the blood thinner. We will try to arrange for ESWL this week if at all possible but, if not, it will have to be next week. If he does not do well clinically, it is possible that he will need stent placement prior to the ESWL.     Signatures Electronically signed by : Aizley Stenseth, M.D.; May 10 2013  5:18PM   

## 2013-05-13 NOTE — Interval H&P Note (Signed)
History and Physical Interval Note:  05/13/2013 12:30 PM  Jake Barnett  has presented today for surgery, with the diagnosis of Left Proximal Ureteral Calculus  The various methods of treatment have been discussed with the patient and family. After consideration of risks, benefits and other options for treatment, the patient has consented to  Procedure(s): CYSTOSCOPY WITH RETROGRADE PYELOGRAM/URETERAL STENT PLACEMENT (Left) as a surgical intervention .  The patient's history has been reviewed, patient examined, no change in status, stable for surgery.  I have reviewed the patient's chart and labs.  Questions were answered to the patient's satisfaction.     [name]

## 2013-05-16 ENCOUNTER — Ambulatory Visit: Payer: Medicare Other | Admitting: *Deleted

## 2013-05-16 ENCOUNTER — Encounter (HOSPITAL_BASED_OUTPATIENT_CLINIC_OR_DEPARTMENT_OTHER): Payer: Self-pay | Admitting: Urology

## 2013-05-17 ENCOUNTER — Encounter (HOSPITAL_COMMUNITY): Payer: Self-pay | Admitting: *Deleted

## 2013-05-18 ENCOUNTER — Encounter: Payer: Medicare Other | Admitting: Physical Therapy

## 2013-05-19 ENCOUNTER — Ambulatory Visit (HOSPITAL_COMMUNITY): Payer: Medicare Other

## 2013-05-19 ENCOUNTER — Ambulatory Visit (HOSPITAL_COMMUNITY)
Admission: RE | Admit: 2013-05-19 | Discharge: 2013-05-19 | Disposition: A | Discharge status: Home / Self Care | Payer: Medicare Other | Source: Ambulatory Visit | Attending: Urology | Admitting: Urology

## 2013-05-19 ENCOUNTER — Encounter (HOSPITAL_COMMUNITY)
Admission: RE | Disposition: A | Discharge status: Home / Self Care | Payer: Self-pay | Source: Ambulatory Visit | Attending: Urology

## 2013-05-19 ENCOUNTER — Encounter (HOSPITAL_COMMUNITY): Payer: Self-pay | Admitting: *Deleted

## 2013-05-19 DIAGNOSIS — N201 Calculus of ureter: Secondary | ICD-10-CM | POA: Insufficient documentation

## 2013-05-19 DIAGNOSIS — R3129 Other microscopic hematuria: Secondary | ICD-10-CM | POA: Insufficient documentation

## 2013-05-19 DIAGNOSIS — N1330 Unspecified hydronephrosis: Secondary | ICD-10-CM | POA: Insufficient documentation

## 2013-05-19 DIAGNOSIS — N200 Calculus of kidney: Secondary | ICD-10-CM | POA: Diagnosis not present

## 2013-05-19 HISTORY — DX: Chronic kidney disease, unspecified: N18.9

## 2013-05-19 SURGERY — LITHOTRIPSY, ESWL
Anesthesia: LOCAL | Laterality: Left

## 2013-05-19 MED ORDER — CIPROFLOXACIN HCL 500 MG PO TABS
250.0000 mg | ORAL_TABLET | ORAL | Status: AC
  Administered 2013-05-19: 250 mg via ORAL
  Filled 2013-05-19: qty 1

## 2013-05-19 MED ORDER — DIPHENHYDRAMINE HCL 25 MG PO CAPS
25.0000 mg | ORAL_CAPSULE | ORAL | Status: AC
  Administered 2013-05-19: 25 mg via ORAL
  Filled 2013-05-19: qty 1

## 2013-05-19 MED ORDER — DEXTROSE-NACL 5-0.45 % IV SOLN
INTRAVENOUS | Status: DC
  Administered 2013-05-19: 15:00:00 via INTRAVENOUS

## 2013-05-19 MED ORDER — DIAZEPAM 5 MG PO TABS
10.0000 mg | ORAL_TABLET | ORAL | Status: AC
  Administered 2013-05-19: 10 mg via ORAL
  Filled 2013-05-19: qty 2

## 2013-05-19 NOTE — Interval H&P Note (Signed)
History and Physical Interval Note:  05/19/2013 10:35 AM  Jake Barnett  has presented today for surgery, with the diagnosis of Left Proximal Ureteral Calculus  The various methods of treatment have been discussed with the patient and family. After consideration of risks, benefits and other options for treatment, the patient has consented to  Procedure(s): LEFT EXTRACORPOREAL SHOCK WAVE LITHOTRIPSY (ESWL) (Left) as a surgical intervention .  The patient's history has been reviewed, patient examined, no change in status, stable for surgery.  I have reviewed the patient's chart and labs.  Questions were answered to the patient's satisfaction.    He is now status post double J stent placement and here for his definitive treatment of the stone.   Ada Woodbury S

## 2013-05-19 NOTE — H&P (View-Only) (Signed)
Reason For Visit         Jayshaun  presents today as an acute work in. He developed some fairly significant left-sided flank and abdominal discomfort yesterday evening. This radiated across his abdomen inserted by history is very suspicious for renal colic. He has also had some increased bladder overactivity with increased bladder pressure urgency and frequency. No gross hematuria. Urinalysis today however shows too numerous to count red blood cells. Several months ago his urinalysis was clear despite his bilateral stone burden. He is approximately 3 weeks out from the surgery that went well. He has had no other systemic complaints other than some mild nausea.   History of Present Illness             Mr. Farquharson returns for routine follow-up visit.  The patient had bilateral simultaneous ureteral calculi which required treatment ( October of 2012).  Stents were subsequently removed and he did well clinically.  The patient was known to have some bilateral renal calculi.  At that time on CT he was thought to have a nonobstructing 3 mm stone in his right kidney and approximately a 9 mm stone in his left kidney.  In retrospect at looking at some of the additional images, that stone in his left kidney appears larger than that.     The patient did undergo KUB imaging in May of 2013.  Again, there are multiple bilateral stones.  The largest on the right appears to be more like 5 mm in size on KUB.  There is more stone burden on the left. In the lower pole he has a stone which measures up to 15 mm.  When I reviewed this relative to his CT scan, it does not look like there has been much change in the number of stones or their location since his last visit.    KUB and renal ultrasound were performed again in June of 2014. Renal ultrasonography demonstrates 2 probable small stones in the right kidney without hydronephrosis or solid renal mass. The left kidney shows multiple calcifications as large as 1-2 cm. Again no  hydronephrosis or solid renal masses. Full measurements of each stone as well as the renal measurements recorded on the worksheet. Of note posterior residuals of 161 mL today. On KUB today the patient does have 2 discrete stones in the right kidney in the 2-4 mm size range. In the left kidney there are 4-5 discrete stones. The largest is in the lower pole it is approximately 17-18 mm. The number and size of the stones is relatively unchanged relative to 6 months ago with the exception of one new stone in the lower pole was left kidney.         Past Medical History Problems  1. History of  Arthritis V13.4 2. History of  Gastric Ulcer 531.90 3. History of  Gout 274.9 4. History of  Heartburn 787.1  Surgical History Problems  1. History of  Cystoscopy With Insertion Of Ureteral Stent Bilateral 2. History of  Cystoscopy With Ureteroscopy With Lithotripsy 3. History of  Ear Surgery 4. History of  Knee Replacement Left 5. History of  Knee Surgery 6. History of  Neck Surgery 7. History of  Shoulder Surgery  Current Meds 1. Cialis 5 MG Oral Tablet; Take 1 tablet daily; Therapy: 05Dec2013 to (Evaluate:03Jul2014); Last  Rx:05Dec2013 2. Colcrys TABS; Therapy: (Recorded:07Oct2014) to 3. Methocarbamol 500 MG Oral Tablet; Therapy: (Recorded:07Oct2014) to 4. MethylPREDNISolone 4 MG Oral Tablet; Therapy: (Recorded:07Oct2014) to 5. Omeprazole TBEC;  1 per day; Therapy: (Recorded:29Oct2012) to 6. TraMADol HCl 50 MG Oral Tablet; Therapy: (Recorded:07Oct2014) to 7. Vitamins/Minerals TABS; Therapy: (Recorded:29Nov2012) to  Allergies Medication  1. Indocin CAPS  Family History Problems  1. Maternal history of  Death In The Family Mother 63yrs 2. Paternal history of  Family Health Status - Father's Age 35yrs 3. Family history of  Family Health Status Number Of Children 1 daughter 4. Paternal history of  Nephrolithiasis  Social History Problems  1. Caffeine Use 3 qd 2. Marital History -  Currently Married 3. Never A Smoker 4. Occupation: retired Nurse, children's  5. History of  Alcohol Use 6. History of  Tobacco Use  Review of Systems Genitourinary, constitutional, skin, eye, otolaryngeal, hematologic/lymphatic, cardiovascular, pulmonary, endocrine, musculoskeletal, gastrointestinal, neurological and psychiatric system(s) were reviewed and pertinent findings if present are noted.  Genitourinary: urinary frequency, feelings of urinary urgency, incomplete emptying of bladder and suprapubic pain, but urine stream is not weak and no hematuria.  Gastrointestinal: nausea, flank pain, abdominal pain and heartburn.  Constitutional: feeling tired (fatigue).  Musculoskeletal: joint pain.    Vitals Vital Signs [Data Includes: Last 1 Day]  07Oct2014 09:48AM  Blood Pressure: 139 / 80 Temperature: 96.5 F Heart Rate: 88  Physical Exam Constitutional:. No acute distress.  Pulmonary: No respiratory distress and normal respiratory rhythm and effort.  Cardiovascular: Heart rate and rhythm are normal . No peripheral edema.  Abdomen: The abdomen is soft and nontender. No masses are palpated. No CVA tenderness. No hernias are palpable. No hepatosplenomegaly noted.  Skin: Normal skin turgor, no visible rash and no visible skin lesions.  Neuro/Psych:. Mood and affect are appropriate.    Results/Data Urine [Data Includes: Last 1 Day]   07Oct2014  COLOR AMBER   APPEARANCE CLOUDY   SPECIFIC GRAVITY 1.020   pH 6.5   GLUCOSE NEG mg/dL  BILIRUBIN NEG   KETONE NEG mg/dL  BLOOD LARGE   PROTEIN TRACE mg/dL  UROBILINOGEN 0.2 mg/dL  NITRITE NEG   LEUKOCYTE ESTERASE NEG   SQUAMOUS EPITHELIAL/HPF RARE   WBC 0-2 WBC/hpf  RBC TNTC RBC/hpf  BACTERIA FEW   CRYSTALS NONE SEEN   CASTS NONE SEEN    Assessment Assessed  1. Renal Colic 788.0 2. Microscopic Hematuria 599.72 3. Nephrolithiasis 592.0 4. Ureteral Stone 592.1  Plan Health Maintenance (V70.0)  1. UA With REFLEX  Done: 07Oct2014  09:38AM Nephrolithiasis (592.0)  2. AU CT-STONE PROTOCOL  Done: 07Oct2014 12:00AM Ureteral Stone (592.1)  3. Hydrocodone-Acetaminophen 5-325 MG Oral Tablet; Take 1-2 tablets every 4-6 hours for pain;  Therapy: 07Oct2014 to (Last Rx:07Oct2014) 4. Follow-up Schedule Surgery Office  Follow-up  Requested for: 07Oct2014  Discussion/Summary   Mr. Bruins underwent CT protocol imaging today. The official report remains pending, but I have reviewed his images. The larger stone that he had in the left kidney is now unfortunately within the proximal ureter and causing at least moderate hydronephrosis. This stones measures 9 mm x 11 mm in diameter, but based on previous KUB, may be as long as 15 or 16 mm in length. Hounsfield units are fairly high. He has had lithotripsy in the past and was able to eventually pass the fragments. Obviously this stone does represent a fair amount of stone burden and does appear to be fairly dense. Other treatment options would be ureteroscopy or percutaneous approach. Given its current location in the proximal ureter, I believe the treatment of choice should be an attempt at ESWL with the understanding that it is fairly likely that  additional treatment may be required with either repeat ESWL or potentially ureteroscopy and/or stent placement to help facilitate the passage of what is going to be a fair number of fragments/stone burden. He has recently stopped Xarelto. We will need to check to determine the amount of time he needs to be off the blood thinner. We will try to arrange for ESWL this week if at all possible but, if not, it will have to be next week. If he does not do well clinically, it is possible that he will need stent placement prior to the ESWL.     Signatures Electronically signed by : Barron Alvine, M.D.; May 10 2013  5:18PM

## 2013-05-19 NOTE — Op Note (Signed)
See [facility] OP note scanned into chart. 

## 2013-05-20 ENCOUNTER — Encounter: Payer: Medicare Other | Admitting: *Deleted

## 2013-05-24 ENCOUNTER — Ambulatory Visit: Payer: Medicare Other | Admitting: *Deleted

## 2013-05-26 ENCOUNTER — Ambulatory Visit: Payer: Medicare Other | Admitting: *Deleted

## 2013-05-26 DIAGNOSIS — Z96659 Presence of unspecified artificial knee joint: Secondary | ICD-10-CM | POA: Diagnosis not present

## 2013-05-26 DIAGNOSIS — M1710 Unilateral primary osteoarthritis, unspecified knee: Secondary | ICD-10-CM | POA: Diagnosis not present

## 2013-05-30 ENCOUNTER — Ambulatory Visit: Payer: Medicare Other | Admitting: Physical Therapy

## 2013-06-03 ENCOUNTER — Ambulatory Visit: Payer: Medicare Other | Admitting: *Deleted

## 2013-06-07 ENCOUNTER — Ambulatory Visit: Payer: Medicare Other | Attending: Orthopedic Surgery | Admitting: Physical Therapy

## 2013-06-07 DIAGNOSIS — IMO0001 Reserved for inherently not codable concepts without codable children: Secondary | ICD-10-CM | POA: Diagnosis not present

## 2013-06-07 DIAGNOSIS — Z96659 Presence of unspecified artificial knee joint: Secondary | ICD-10-CM | POA: Insufficient documentation

## 2013-06-07 DIAGNOSIS — M25669 Stiffness of unspecified knee, not elsewhere classified: Secondary | ICD-10-CM | POA: Insufficient documentation

## 2013-06-07 DIAGNOSIS — M25569 Pain in unspecified knee: Secondary | ICD-10-CM | POA: Insufficient documentation

## 2013-06-07 DIAGNOSIS — R5381 Other malaise: Secondary | ICD-10-CM | POA: Insufficient documentation

## 2013-06-09 DIAGNOSIS — N201 Calculus of ureter: Secondary | ICD-10-CM | POA: Diagnosis not present

## 2013-06-10 ENCOUNTER — Other Ambulatory Visit: Payer: Self-pay | Admitting: Urology

## 2013-06-13 ENCOUNTER — Encounter (HOSPITAL_COMMUNITY): Payer: Self-pay | Admitting: *Deleted

## 2013-06-15 ENCOUNTER — Ambulatory Visit (HOSPITAL_COMMUNITY)
Admission: RE | Admit: 2013-06-15 | Discharge: 2013-06-15 | Disposition: A | Discharge status: Home / Self Care | Payer: Medicare Other | Source: Ambulatory Visit | Attending: Urology | Admitting: Urology

## 2013-06-15 ENCOUNTER — Encounter (HOSPITAL_COMMUNITY): Payer: Medicare Other | Admitting: Certified Registered Nurse Anesthetist

## 2013-06-15 ENCOUNTER — Ambulatory Visit (HOSPITAL_COMMUNITY): Payer: Medicare Other | Admitting: Certified Registered Nurse Anesthetist

## 2013-06-15 ENCOUNTER — Encounter (HOSPITAL_COMMUNITY)
Admission: RE | Disposition: A | Discharge status: Home / Self Care | Payer: Self-pay | Source: Ambulatory Visit | Attending: Urology

## 2013-06-15 ENCOUNTER — Encounter (HOSPITAL_COMMUNITY): Payer: Self-pay | Admitting: *Deleted

## 2013-06-15 DIAGNOSIS — K449 Diaphragmatic hernia without obstruction or gangrene: Secondary | ICD-10-CM | POA: Diagnosis not present

## 2013-06-15 DIAGNOSIS — D649 Anemia, unspecified: Secondary | ICD-10-CM | POA: Diagnosis not present

## 2013-06-15 DIAGNOSIS — N201 Calculus of ureter: Secondary | ICD-10-CM | POA: Diagnosis not present

## 2013-06-15 DIAGNOSIS — K219 Gastro-esophageal reflux disease without esophagitis: Secondary | ICD-10-CM | POA: Diagnosis not present

## 2013-06-15 DIAGNOSIS — I10 Essential (primary) hypertension: Secondary | ICD-10-CM | POA: Insufficient documentation

## 2013-06-15 HISTORY — PX: CYSTOSCOPY WITH RETROGRADE PYELOGRAM, URETEROSCOPY AND STENT PLACEMENT: SHX5789

## 2013-06-15 HISTORY — DX: Malignant (primary) neoplasm, unspecified: C80.1

## 2013-06-15 HISTORY — PX: HOLMIUM LASER APPLICATION: SHX5852

## 2013-06-15 LAB — CBC
HCT: 41.7 % (ref 39.0–52.0)
Hemoglobin: 14.2 g/dL (ref 13.0–17.0)
MCH: 28.5 pg (ref 26.0–34.0)
MCHC: 34.1 g/dL (ref 30.0–36.0)
MCV: 83.7 fL (ref 78.0–100.0)
Platelets: 261 10*3/uL (ref 150–400)
RBC: 4.98 MIL/uL (ref 4.22–5.81)

## 2013-06-15 SURGERY — CYSTOURETEROSCOPY, WITH RETROGRADE PYELOGRAM AND STENT INSERTION
Anesthesia: General | Laterality: Left | Wound class: Clean Contaminated

## 2013-06-15 MED ORDER — EPHEDRINE SULFATE 50 MG/ML IJ SOLN
INTRAMUSCULAR | Status: DC | PRN
  Administered 2013-06-15: 5 mg via INTRAVENOUS

## 2013-06-15 MED ORDER — CIPROFLOXACIN IN D5W 400 MG/200ML IV SOLN
400.0000 mg | INTRAVENOUS | Status: AC
  Administered 2013-06-15: 400 mg via INTRAVENOUS

## 2013-06-15 MED ORDER — LIDOCAINE HCL 2 % EX GEL
CUTANEOUS | Status: AC
  Filled 2013-06-15: qty 10

## 2013-06-15 MED ORDER — PROMETHAZINE HCL 25 MG/ML IJ SOLN: 6.2500 mg | INTRAMUSCULAR | Status: DC | PRN

## 2013-06-15 MED ORDER — DEXAMETHASONE SODIUM PHOSPHATE 10 MG/ML IJ SOLN
INTRAMUSCULAR | Status: DC | PRN
  Administered 2013-06-15: 10 mg via INTRAVENOUS

## 2013-06-15 MED ORDER — KETOROLAC TROMETHAMINE 30 MG/ML IJ SOLN
INTRAMUSCULAR | Status: DC | PRN
  Administered 2013-06-15: 30 mg via INTRAVENOUS

## 2013-06-15 MED ORDER — LIDOCAINE HCL 2 % EX GEL
CUTANEOUS | Status: DC | PRN
  Administered 2013-06-15: 1

## 2013-06-15 MED ORDER — FENTANYL CITRATE 0.05 MG/ML IJ SOLN
INTRAMUSCULAR | Status: DC | PRN
  Administered 2013-06-15 (×5): 50 ug via INTRAVENOUS

## 2013-06-15 MED ORDER — MIDAZOLAM HCL 5 MG/5ML IJ SOLN
INTRAMUSCULAR | Status: DC | PRN
  Administered 2013-06-15 (×2): 1 mg via INTRAVENOUS

## 2013-06-15 MED ORDER — CIPROFLOXACIN IN D5W 400 MG/200ML IV SOLN
INTRAVENOUS | Status: AC
  Filled 2013-06-15: qty 200

## 2013-06-15 MED ORDER — PROPOFOL 10 MG/ML IV BOLUS
INTRAVENOUS | Status: DC | PRN
  Administered 2013-06-15: 150 mg via INTRAVENOUS

## 2013-06-15 MED ORDER — LIDOCAINE HCL (CARDIAC) 20 MG/ML IV SOLN
INTRAVENOUS | Status: DC | PRN
  Administered 2013-06-15: 50 mg via INTRAVENOUS

## 2013-06-15 MED ORDER — MEPERIDINE HCL 50 MG/ML IJ SOLN: 6.2500 mg | INTRAMUSCULAR | Status: DC | PRN

## 2013-06-15 MED ORDER — ONDANSETRON HCL 4 MG/2ML IJ SOLN
INTRAMUSCULAR | Status: DC | PRN
  Administered 2013-06-15 (×2): 2 mg via INTRAVENOUS

## 2013-06-15 MED ORDER — LACTATED RINGERS IV SOLN
INTRAVENOUS | Status: DC
  Administered 2013-06-15: 16:00:00 via INTRAVENOUS
  Administered 2013-06-15: 1000 mL via INTRAVENOUS

## 2013-06-15 MED ORDER — OXYCODONE HCL 5 MG PO TABS: 5.0000 mg | ORAL_TABLET | Freq: Once | ORAL | Status: DC | PRN

## 2013-06-15 MED ORDER — SODIUM CHLORIDE 0.9 % IR SOLN
Status: DC | PRN
  Administered 2013-06-15: 4000 mL

## 2013-06-15 MED ORDER — HYDROMORPHONE HCL PF 1 MG/ML IJ SOLN: 0.2500 mg | INTRAMUSCULAR | Status: DC | PRN

## 2013-06-15 MED ORDER — OXYCODONE HCL 5 MG/5ML PO SOLN
5.0000 mg | Freq: Once | ORAL | Status: DC | PRN
  Filled 2013-06-15: qty 5

## 2013-06-15 MED ORDER — PHENYLEPHRINE HCL 10 MG/ML IJ SOLN
INTRAMUSCULAR | Status: DC | PRN
  Administered 2013-06-15: 40 ug via INTRAVENOUS

## 2013-06-15 SURGICAL SUPPLY — 21 items
BAG URINE DRAINAGE (UROLOGICAL SUPPLIES) ×1 IMPLANT
BAG URO CATCHER STRL LF (DRAPE) ×2 IMPLANT
BASKET ZERO TIP NITINOL 2.4FR (BASKET) ×1 IMPLANT
BSKT STON RTRVL ZERO TP 2.4FR (BASKET) ×1
CATH INTERMIT  6FR 70CM (CATHETERS) IMPLANT
CLOTH BEACON ORANGE TIMEOUT ST (SAFETY) ×2 IMPLANT
DRAPE CAMERA CLOSED 9X96 (DRAPES) ×2 IMPLANT
FIBER LASER FLEXIVA 200 (UROLOGICAL SUPPLIES) IMPLANT
FIBER LASER FLEXIVA 365 (UROLOGICAL SUPPLIES) ×1 IMPLANT
GLOVE BIOGEL M STRL SZ7.5 (GLOVE) ×2 IMPLANT
GOWN STRL REIN XL XLG (GOWN DISPOSABLE) ×2 IMPLANT
GUIDEWIRE .038 (WIRE) IMPLANT
GUIDEWIRE ANG ZIPWIRE 038X150 (WIRE) IMPLANT
GUIDEWIRE STR DUAL SENSOR (WIRE) ×3 IMPLANT
INSTR STONE CONE (INSTRUMENTS) ×2
INSTRUMENT STONE CONE (INSTRUMENTS) IMPLANT
IV NS IRRIG 3000ML ARTHROMATIC (IV SOLUTION) ×2 IMPLANT
MANIFOLD NEPTUNE II (INSTRUMENTS) ×2 IMPLANT
PACK CYSTO (CUSTOM PROCEDURE TRAY) ×2 IMPLANT
SYRINGE IRR TOOMEY STRL 70CC (SYRINGE) ×1 IMPLANT
TUBING CONNECTING 10 (TUBING) ×2 IMPLANT

## 2013-06-15 NOTE — Anesthesia Postprocedure Evaluation (Signed)
Anesthesia Post Note  Patient: Jake Barnett  Procedure(s) Performed: Procedure(s) (LRB): STENT REMOVAL/CYSTOSCOPY//URETEROSCOPYSTONE EXTRACTION WITH BASKET (Left) HOLMIUM LASER APPLICATION (Left)  Anesthesia type: General  Patient location: PACU  Post pain: Pain level controlled  Post assessment: Post-op Vital signs reviewed  Last Vitals: BP 143/68  Pulse 85  Temp(Src) 36.6 C (Oral)  Resp 16  Ht 5\' 11"  (1.803 m)  Wt 197 lb (89.359 kg)  BMI 27.49 kg/m2  SpO2 99%  Post vital signs: Reviewed  Level of consciousness: sedated  Complications: No apparent anesthesia complications

## 2013-06-15 NOTE — Anesthesia Preprocedure Evaluation (Addendum)
Anesthesia Evaluation  Patient identified by MRN, date of birth, ID band Patient awake    Reviewed: Allergy & Precautions, H&P , NPO status , Patient's Chart, lab work & pertinent test results  History of Anesthesia Complications (+) PONV and history of anesthetic complications  Airway Mallampati: II TM Distance: >3 FB Neck ROM: Full    Dental no notable dental hx.    Pulmonary shortness of breath,  breath sounds clear to auscultation  Pulmonary exam normal       Cardiovascular Exercise Tolerance: Good hypertension, Pt. on medications negative cardio ROS  + dysrhythmias Rhythm:Regular Rate:Normal  ECG: NSR, RBBB  H/O palpitations. H/O diastolic dysfunction.  Clearance cardiology 04-05-13 for tka.  Stress echo from 2010: EF 50-55%, diastolic dysfunction   Neuro/Psych negative neurological ROS  negative psych ROS   GI/Hepatic Neg liver ROS, hiatal hernia, GERD-  Medicated,  Endo/Other  negative endocrine ROS  Renal/GU Renal diseasenegative Renal ROS  negative genitourinary   Musculoskeletal negative musculoskeletal ROS (+)   Abdominal   Peds negative pediatric ROS (+)  Hematology  (+) anemia ,   Anesthesia Other Findings   Reproductive/Obstetrics negative OB ROS                         Anesthesia Physical  Anesthesia Plan  ASA: II  Anesthesia Plan: General   Post-op Pain Management:    Induction: Intravenous  Airway Management Planned: LMA  Additional Equipment:   Intra-op Plan:   Post-operative Plan: Extubation in OR  Informed Consent: I have reviewed the patients History and Physical, chart, labs and discussed the procedure including the risks, benefits and alternatives for the proposed anesthesia with the patient or authorized representative who has indicated his/her understanding and acceptance.   Dental advisory given  Plan Discussed with: CRNA  Anesthesia Plan  Comments:         Anesthesia Quick Evaluation

## 2013-06-15 NOTE — Anesthesia Procedure Notes (Signed)
Procedure Name: LMA Insertion Date/Time: 06/15/2013 2:43 PM Performed by: Edison Pace Pre-anesthesia Checklist: Patient identified, Timeout performed, Emergency Drugs available, Suction available and Patient being monitored Patient Re-evaluated:Patient Re-evaluated prior to inductionOxygen Delivery Method: Circle system utilized Preoxygenation: Pre-oxygenation with 100% oxygen Intubation Type: IV induction LMA: LMA inserted LMA Size: 4.0 Number of attempts: 2 (first proseal 4 not good seal second regular LMA 4 good seal) Placement Confirmation: positive ETCO2 Tube secured with: Tape Dental Injury: Teeth and Oropharynx as per pre-operative assessment

## 2013-06-15 NOTE — Preoperative (Signed)
Beta Blockers   Reason not to administer Beta Blockers:Not Applicable 

## 2013-06-15 NOTE — Transfer of Care (Signed)
Immediate Anesthesia Transfer of Care Note  Patient: Jake Barnett  Procedure(s) Performed: Procedure(s): STENT REMOVAL/CYSTOSCOPY//URETEROSCOPYSTONE EXTRACTION WITH BASKET (Left) HOLMIUM LASER APPLICATION (Left)  Patient Location: PACU  Anesthesia Type:General  Level of Consciousness: awake, oriented, patient cooperative, lethargic and responds to stimulation  Airway & Oxygen Therapy: Patient Spontanous Breathing and Patient connected to face mask oxygen  Post-op Assessment: Report given to PACU RN, Post -op Vital signs reviewed and stable and Patient moving all extremities  Post vital signs: Reviewed and stable  Complications: No apparent anesthesia complications

## 2013-06-15 NOTE — Interval H&P Note (Signed)
History and Physical Interval Note:  06/15/2013 2:35 PM  Jake Barnett  has presented today for surgery, with the diagnosis of Left Proximal Ureteral Stones  The various methods of treatment have been discussed with the patient and family. After consideration of risks, benefits and other options for treatment, the patient has consented to  Procedure(s): CYSTOSCOPY/RETROGRADE/URETEROSCOPY/STONE EXTRACTION WITH BASKET (Left) HOLMIUM LASER APPLICATION (Left) as a surgical intervention .  The patient's history has been reviewed, patient examined, no change in status, stable for surgery.  I have reviewed the patient's chart and labs.  Questions were answered to the patient's satisfaction.     [name] S

## 2013-06-15 NOTE — H&P (Signed)
  Active Problems Jake Barnett presents today   For ureteroscopy with holmium laser lithotripsy to treat some residual stone fragments in the ureter status post previous stent placement and ESWL for a large 2 cm ureteral calculus.  History of Present Illness Jake Barnett has a history of bilateral nephrolithiasis. He has had cysto, ureteroscopy, and bilateral stenting in the past. Jake Barnett presented last month with left flank pain. He had a CT that showed bilateral nephrolithiasis with an almost 2 cm obstructing left proximal ureteral stone. He underwent stent placement and ESWL of this stone on 10/16.    Interval history: Jake Barnett presents today for a post op follow up. He is doing well today. He has passed many fragments and brings them with him today for analysis. He can "tell the stent is there" but really has not had any flank pain and has not required any pain medication. He has had some hematuria when he increases his activity as well as some frequency and urgency likely caused by the stent. He denies n/v or f/c.   Vitals Vital Signs [Data Includes: Last 1 Day]  Recorded: 06Nov2014 10:28AM  Height: 5 ft 11 in Weight: 200 lb  BMI Calculated: 27.89 BSA Calculated: 2.11 Blood Pressure: 160 / 88 Temperature: 97.5 F Heart Rate: 75  Physical Exam Constitutional: Well nourished and well developed . No acute distress.  Abdomen: The abdomen is soft and nontender. No CVA tenderness.    Results/Data Urine [Data Includes: Last 1 Day]   06Nov2014  COLOR YELLOW   APPEARANCE CLOUDY   SPECIFIC GRAVITY 1.020   pH 5.5   GLUCOSE NEG mg/dL  BILIRUBIN NEG   KETONE NEG mg/dL  BLOOD LARGE   PROTEIN 30 mg/dL  UROBILINOGEN 0.2 mg/dL  NITRITE NEG   LEUKOCYTE ESTERASE SMALL   SQUAMOUS EPITHELIAL/HPF RARE   WBC 7-10 WBC/hpf  RBC TNTC RBC/hpf  BACTERIA NONE SEEN   CRYSTALS NONE SEEN   CASTS NONE SEEN   Other MUCUS    The following images/tracing/specimen were independently visualized: Marland Kitchen KUB: stable  non obstructing stones seen within each kidney, left sided stent well placed with several large fragments along the course of the stent    Assessment Assessed  1. Calculus of ureter (592.1)  I have told Jake Barnett that his lithotripsy clearly broke his large stone up into smaller pieces, but he still has several remaining large sized pieces. I think if his stent was pulled his kidney would become obstructed. I am not sure if he would be able to pass these large pieces. I have told him that I am going to send his KUB to Dr. Isabel Caprice. He likely is going to need ureteroscopy and stone extraction of these large pieces although repeat ESWL may be an option. I will let him know what Dr. Ellin Goodie advice is and he will be scheduled accordingly. In the meantime he knows to call if he has pain or n/v uncontrolled with medication or a temp of 100.5 or greater.   Plan Health Maintenance  1. UA With REFLEX; Status:Complete;   Done: 06Nov2014 09:52AM  1. F/u as per Dr. Isabel Caprice- will likely need an additional procedure for large remaining left ureteral fragments   Signatures Electronically signed by : Denna Haggard, NP-C; Jun 09 2013 11:08AM EST

## 2013-06-16 ENCOUNTER — Encounter (HOSPITAL_COMMUNITY): Payer: Self-pay | Admitting: Urology

## 2013-06-16 NOTE — Op Note (Signed)
Preoperative diagnosis:multiple left ureteral stone fragment status post ESWL Postoperative diagnosis:same  Procedure:cystoscopy, removal of left double-J stent, left ureteroscopy, holmium laser lithotripsy of multiple ureteral calculi.  Basket extraction of multiple ureteral stone fragments   Surgeon: Valetta Fuller M.D.  Anesthesia: Gen.  Indications:Mister Claxton had a 2 cm proximal ureteral stone with ongoing severe pain.  He underwent double-J stent placement followed by ESWL.  The stonedid fragment but the patient was noted to have several large fragments along the course of the stent.  We recommended a second area procedure.     Technique and findings:patient was brought to the operating room where he has successful induction of general anesthesia.  He was placed in lithotomy position and prepped and draped in the usual manner.  He received perioperative antibiotics and PAS compression boots.  Appropriate surgical timeout was performed.  Cystoscopy revealed a unremarkable anterior urethra with moderate trilobar hyperplasia.  The stent was seen in good position.  It was partially extracted but a guidewire was unable to be passed through the stent and therefore a guidewire was placed along the stent up to the left renal pelvis with fluoroscopic guidance.  The double J stent was then removed.  The left ureter was then easily engaged with a 6 French rigid ureteroscope.  Multiple small fragments were noted in the distal ureter.  A larger fragment was treated with laser lithotripsy utilizing a 365  fiber with a power of 0.8 J and a rate of 8 Hz.  More proximally a large fragment was encountered which again underwent laser lithotripsy.  At least a dozen fragments were then basket extracted with the nitinol 0 tip basket.  There appeared to be excellent ureteral dilation from the stent and there was no evidence of any stone impaction.  We felt the patient would do better without a stent therefore a double-J  stent was not replaced.  The bladder was drained and lidocaine jelly instilled.  The patient had no obvious complications or problems and was brought to recovery room in stable condition.

## 2013-06-20 ENCOUNTER — Ambulatory Visit: Payer: Medicare Other | Admitting: Physical Therapy

## 2013-06-20 ENCOUNTER — Ambulatory Visit (INDEPENDENT_AMBULATORY_CARE_PROVIDER_SITE_OTHER): Payer: Medicare Other

## 2013-06-20 DIAGNOSIS — Z23 Encounter for immunization: Secondary | ICD-10-CM | POA: Diagnosis not present

## 2013-06-24 DIAGNOSIS — N200 Calculus of kidney: Secondary | ICD-10-CM | POA: Diagnosis not present

## 2013-06-24 DIAGNOSIS — R3915 Urgency of urination: Secondary | ICD-10-CM | POA: Diagnosis not present

## 2013-06-28 ENCOUNTER — Ambulatory Visit: Payer: Medicare Other | Admitting: Physical Therapy

## 2013-07-08 DIAGNOSIS — N201 Calculus of ureter: Secondary | ICD-10-CM | POA: Diagnosis not present

## 2013-07-08 DIAGNOSIS — N200 Calculus of kidney: Secondary | ICD-10-CM | POA: Diagnosis not present

## 2013-08-08 ENCOUNTER — Encounter (HOSPITAL_COMMUNITY): Payer: Self-pay | Admitting: Anesthesiology

## 2013-09-06 DIAGNOSIS — H02839 Dermatochalasis of unspecified eye, unspecified eyelid: Secondary | ICD-10-CM | POA: Diagnosis not present

## 2013-09-06 DIAGNOSIS — H04129 Dry eye syndrome of unspecified lacrimal gland: Secondary | ICD-10-CM | POA: Diagnosis not present

## 2013-09-06 DIAGNOSIS — H1045 Other chronic allergic conjunctivitis: Secondary | ICD-10-CM | POA: Diagnosis not present

## 2013-09-06 DIAGNOSIS — H35379 Puckering of macula, unspecified eye: Secondary | ICD-10-CM | POA: Diagnosis not present

## 2013-09-06 DIAGNOSIS — H2510 Age-related nuclear cataract, unspecified eye: Secondary | ICD-10-CM | POA: Diagnosis not present

## 2013-09-14 DIAGNOSIS — N201 Calculus of ureter: Secondary | ICD-10-CM | POA: Diagnosis not present

## 2013-09-14 DIAGNOSIS — N200 Calculus of kidney: Secondary | ICD-10-CM | POA: Diagnosis not present

## 2013-12-05 DIAGNOSIS — J329 Chronic sinusitis, unspecified: Secondary | ICD-10-CM | POA: Diagnosis not present

## 2013-12-14 IMAGING — CR DG ABDOMEN 1V
1 series · 1 of 1 positions shown · non-contrast
Comparison: CT 05/10/2013

CLINICAL DATA: Left kidney stone and lithotripsy.

EXAM:
ABDOMEN - 1 VIEW

[t abdomen supine]
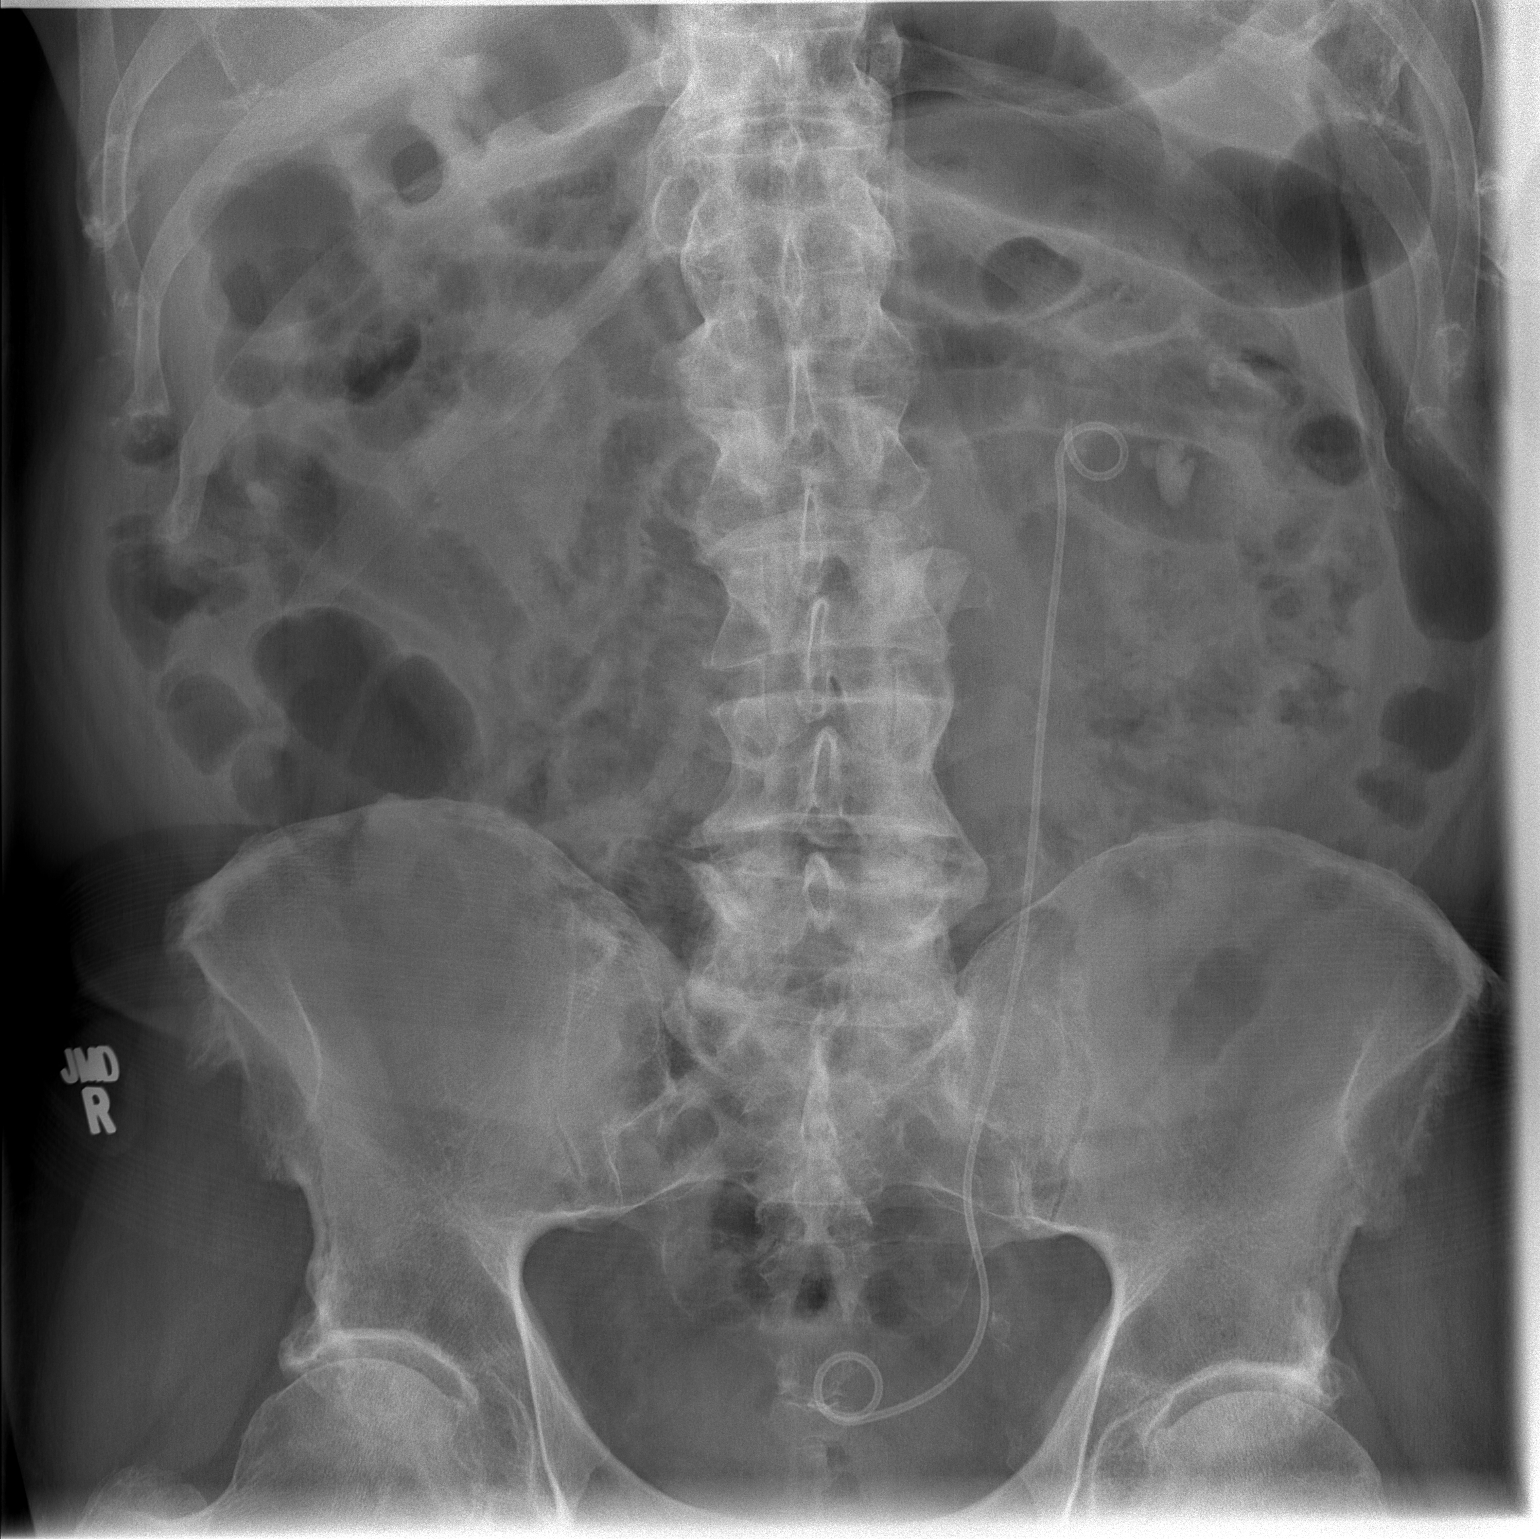

[1 of 1 positions shown; findings below may reference images not displayed]

FINDINGS: Single view of the abdomen was obtained. Patient has a left ureteral
stent. There are at least 3 calcifications in the left renal
collecting system. The largest stone measures up to 2.0 cm and this
stone is compatible with the stone that was previously in the
proximal ureter. Nonobstructive bowel gas pattern.
IMPRESSION: Left renal calculi. Largest stone measures up to 2 cm.

Left ureter stent.

## 2014-01-06 DIAGNOSIS — N400 Enlarged prostate without lower urinary tract symptoms: Secondary | ICD-10-CM | POA: Diagnosis not present

## 2014-01-06 DIAGNOSIS — N200 Calculus of kidney: Secondary | ICD-10-CM | POA: Diagnosis not present

## 2014-04-05 DIAGNOSIS — K219 Gastro-esophageal reflux disease without esophagitis: Secondary | ICD-10-CM | POA: Diagnosis not present

## 2014-04-05 DIAGNOSIS — Z86010 Personal history of colonic polyps: Secondary | ICD-10-CM | POA: Diagnosis not present

## 2014-04-05 DIAGNOSIS — R1013 Epigastric pain: Secondary | ICD-10-CM | POA: Diagnosis not present

## 2014-05-04 DIAGNOSIS — K219 Gastro-esophageal reflux disease without esophagitis: Secondary | ICD-10-CM | POA: Diagnosis not present

## 2014-05-04 DIAGNOSIS — D125 Benign neoplasm of sigmoid colon: Secondary | ICD-10-CM | POA: Diagnosis not present

## 2014-05-04 DIAGNOSIS — K449 Diaphragmatic hernia without obstruction or gangrene: Secondary | ICD-10-CM | POA: Diagnosis not present

## 2014-05-04 DIAGNOSIS — Z86010 Personal history of colonic polyps: Secondary | ICD-10-CM | POA: Diagnosis not present

## 2014-05-04 DIAGNOSIS — Z09 Encounter for follow-up examination after completed treatment for conditions other than malignant neoplasm: Secondary | ICD-10-CM | POA: Diagnosis not present

## 2014-05-09 ENCOUNTER — Ambulatory Visit (INDEPENDENT_AMBULATORY_CARE_PROVIDER_SITE_OTHER): Payer: Medicare Other | Admitting: Cardiology

## 2014-05-09 ENCOUNTER — Encounter: Payer: Self-pay | Admitting: Cardiology

## 2014-05-09 VITALS — BP 140/80 | HR 59 | Ht 70.0 in | Wt 208.4 lb

## 2014-05-09 DIAGNOSIS — R002 Palpitations: Secondary | ICD-10-CM

## 2014-05-09 DIAGNOSIS — R0600 Dyspnea, unspecified: Secondary | ICD-10-CM | POA: Diagnosis not present

## 2014-05-09 DIAGNOSIS — R072 Precordial pain: Secondary | ICD-10-CM

## 2014-05-09 NOTE — Progress Notes (Signed)
HPI: FU palpitations and chest pain. Previous monitor in 2009 showed sinus with occasional PVC and PAC and short nonsustained run of atrial tachycardia (4 beats). Stress echocardiogram in May of 6333 showed diastolic dysfunction but no ECG changes and no wall motion abnormalities. Patient had a Myoview in July of 2012 and showed an ejection fraction of 66% and normal perfusion. Since last seen, He has mild dyspnea on exertion but no orthopnea, PND or pedal edema. Over the past 3 months he has had chest heaviness that has been almost continuous. It does not radiate. No associated symptoms. Some improvement with belching. He does not have exertional chest pain.   Current Outpatient Prescriptions  Medication Sig Dispense Refill  . omeprazole (PRILOSEC) 20 MG capsule Take 20 mg by mouth 2 (two) times daily before a meal.       No current facility-administered medications for this visit.     Past Medical History  Diagnosis Date  . GERD (gastroesophageal reflux disease)   . Hyperlipidemia   . Arthritis   . H/O hiatal hernia   . History of kidney stones   . Left ureteral calculus   . Palpitations     occasional PAC/PVC.  . Mild diastolic dysfunction     AYMPTOMATIC  . PONV (postoperative nausea and vomiting)   . Chronic kidney disease     kidney stones  . RBBB   . Cancer     hx of basal cell skin cancer     Past Surgical History  Procedure Laterality Date  . Total knee arthroplasty Left 04/18/2013    Procedure: LEFT TOTAL KNEE ARTHROPLASTY;  Surgeon: Gearlean Alf, MD;  Location: [facility];  Service: Orthopedics;  Laterality: Left;  . Cysto/ bilateral laser lithotripsy with stone extractions  05-26-2011  . Transthoracic echocardiogram  08-07-2008    NORMAL LVF/  EF 55-60%  . Cardiovascular stress test  02-11-2011   DR CRENSHAW    NORMAL / NO ISCHEMIA/ EF 66%  . Knee arthroscopy Right 2008  . Shoulder arthroscopy Right 2007  . Anterior cervical decomp/discectomy fusion  2001     C5  -- C7  . Tympanoplasty Right 1971  . Cystoscopy w/ ureteral stent placement Left 05/13/2013    Procedure: CYSTOSCOPY WITH RETROGRADE PYELOGRAM/URETERAL STENT PLACEMENT;  Surgeon: Bernestine Amass, MD;  Location: [facility];  Service: Urology;  Laterality: Left;  . Cystoscopy with retrograde pyelogram, ureteroscopy and stent placement Left 06/15/2013    Procedure: STENT REMOVAL/CYSTOSCOPY//URETEROSCOPYSTONE EXTRACTION WITH BASKET;  Surgeon: Bernestine Amass, MD;  Location: [facility];  Service: Urology;  Laterality: Left;  . Holmium laser application Left 54/56/2563    Procedure: HOLMIUM LASER APPLICATION;  Surgeon: Bernestine Amass, MD;  Location: [facility];  Service: Urology;  Laterality: Left;    History   Social History  . Marital Status: Married    Spouse Name: N/A    Number of Children: N/A  . Years of Education: N/A   Occupational History  . Not on file.   Social History Main Topics  . Smoking status: Never Smoker   . Smokeless tobacco: Never Used  . Alcohol Use: Yes     Comment: occassionally wine  . Drug Use: No  . Sexual Activity: Not on file   Other Topics Concern  . Not on file   Social History Narrative  . No narrative on file    ROS: no fevers or chills, productive cough, hemoptysis, dysphasia, odynophagia, melena, hematochezia,  dysuria, hematuria, rash, seizure activity, orthopnea, PND, pedal edema, claudication. Remaining systems are negative.  Physical Exam: Well-developed well-nourished in no acute distress.  Skin is warm and dry.  HEENT is normal.  Neck is supple.  Chest is clear to auscultation with normal expansion.  Cardiovascular exam is regular rate and rhythm.  Abdominal exam nontender or distended. No masses palpated. Extremities show no edema. neuro grossly intact  ECG Sinus bradycardia with occasional PAC. No ST changes.

## 2014-05-09 NOTE — Assessment & Plan Note (Signed)
Patient follows his blood pressure at home and it is typically controlled on no medications.

## 2014-05-09 NOTE — Assessment & Plan Note (Signed)
Resolved

## 2014-05-09 NOTE — Patient Instructions (Signed)
Your physician wants you to follow-up in: ONE YEAR WITH DR CRENSHAW You will receive a reminder letter in the mail two months in advance. If you don't receive a letter, please call our office to schedule the follow-up appointment.   Your physician has requested that you have an exercise tolerance test. For further information please visit [email or web address]. Please also follow instruction sheet, as given.    Exercise Stress Electrocardiogram  An exercise stress electrocardiogram is a test to check how blood flows to your heart. It is done to find areas of poor blood flow. You will need to walk on a treadmill for this test. The electrocardiogram will record your heartbeat when you are at rest and when you are exercising. BEFORE THE PROCEDURE  Do not have drinks with caffeine or foods with caffeine for 24 hours before the test, or as told by your doctor. This includes coffee, tea (even decaf tea), sodas, chocolate, and cocoa.  Follow your doctor's instructions about eating and drinking before the test.  Ask your doctor what medicines you should or should not take before the test. Take your medicines with water unless told by your doctor not to.  If you use an inhaler, bring it with you to the test.  Bring a snack to eat after the test.  Do not  smoke for 4 hours before the test.  Do not put lotions, powders, creams, or oils on your chest before the test.  Wear comfortable shoes and clothing. PROCEDURE  You will have patches put on your chest. Small areas of your chest may need to be shaved. Wires will be connected to the patches.  Your heart rate will be watched while you are resting and while you are exercising.  You will walk on the treadmill. The treadmill will slowly get faster to raise your heart rate.  The test will take about 1-2 hours. AFTER THE PROCEDURE  Your heart rate and blood pressure will be watched after the test.  You may return to your normal diet, activities,  and medicines or as told by your doctor. Document Released: 01/07/2008 Document Revised: 12/05/2013 Document Reviewed: 03/28/2013 Exit[employer] Patient Information 2015 [employer]. This information is not intended to replace advice given to you by your health care provider. Make sure you discuss any questions you have with your health care provider.  

## 2014-05-09 NOTE — Assessment & Plan Note (Signed)
Symptoms seem most consistent with reflux. We will schedule an exercise treadmill for risk stratification.

## 2014-05-20 DIAGNOSIS — R918 Other nonspecific abnormal finding of lung field: Secondary | ICD-10-CM | POA: Diagnosis not present

## 2014-05-20 DIAGNOSIS — R05 Cough: Secondary | ICD-10-CM | POA: Diagnosis not present

## 2014-05-20 DIAGNOSIS — J101 Influenza due to other identified influenza virus with other respiratory manifestations: Secondary | ICD-10-CM | POA: Diagnosis not present

## 2014-05-26 ENCOUNTER — Telehealth (HOSPITAL_COMMUNITY): Payer: Self-pay

## 2014-05-26 NOTE — Telephone Encounter (Signed)
Encounter complete. 

## 2014-05-29 DIAGNOSIS — L570 Actinic keratosis: Secondary | ICD-10-CM | POA: Diagnosis not present

## 2014-05-29 DIAGNOSIS — C44311 Basal cell carcinoma of skin of nose: Secondary | ICD-10-CM | POA: Diagnosis not present

## 2014-05-29 DIAGNOSIS — L820 Inflamed seborrheic keratosis: Secondary | ICD-10-CM | POA: Diagnosis not present

## 2014-05-31 ENCOUNTER — Encounter (HOSPITAL_COMMUNITY): Payer: Medicare Other

## 2014-06-08 ENCOUNTER — Ambulatory Visit
Admission: RE | Admit: 2014-06-08 | Discharge: 2014-06-08 | Disposition: A | Discharge status: Home / Self Care | Payer: Medicare Other | Source: Ambulatory Visit | Attending: Family Medicine | Admitting: Family Medicine

## 2014-06-08 ENCOUNTER — Other Ambulatory Visit (HOSPITAL_COMMUNITY): Payer: Self-pay | Admitting: Diagnostic Radiology

## 2014-06-08 ENCOUNTER — Other Ambulatory Visit: Payer: Self-pay | Admitting: Family Medicine

## 2014-06-08 ENCOUNTER — Inpatient Hospital Stay
Admission: RE | Admit: 2014-06-08 | Discharge: 2014-06-08 | Disposition: A | Discharge status: Home / Self Care | Payer: Self-pay | Source: Ambulatory Visit | Attending: Diagnostic Radiology | Admitting: Diagnostic Radiology

## 2014-06-08 DIAGNOSIS — R059 Cough, unspecified: Secondary | ICD-10-CM

## 2014-06-08 DIAGNOSIS — R05 Cough: Secondary | ICD-10-CM

## 2014-06-08 DIAGNOSIS — C4491 Basal cell carcinoma of skin, unspecified: Secondary | ICD-10-CM | POA: Diagnosis not present

## 2014-06-08 DIAGNOSIS — N200 Calculus of kidney: Secondary | ICD-10-CM | POA: Diagnosis not present

## 2014-06-08 DIAGNOSIS — R918 Other nonspecific abnormal finding of lung field: Secondary | ICD-10-CM | POA: Diagnosis not present

## 2014-06-08 DIAGNOSIS — R9389 Abnormal findings on diagnostic imaging of other specified body structures: Secondary | ICD-10-CM

## 2014-06-08 DIAGNOSIS — Z23 Encounter for immunization: Secondary | ICD-10-CM | POA: Diagnosis not present

## 2014-06-16 ENCOUNTER — Telehealth (HOSPITAL_COMMUNITY): Payer: Self-pay

## 2014-06-16 NOTE — Telephone Encounter (Signed)
Encounter complete. 

## 2014-06-20 ENCOUNTER — Telehealth (HOSPITAL_COMMUNITY): Payer: Self-pay

## 2014-06-20 NOTE — Telephone Encounter (Signed)
Encounter complete. 

## 2014-06-21 ENCOUNTER — Ambulatory Visit (HOSPITAL_COMMUNITY)
Admission: RE | Admit: 2014-06-21 | Discharge: 2014-06-21 | Disposition: A | Discharge status: Home / Self Care | Payer: Medicare Other | Source: Ambulatory Visit | Attending: Cardiovascular Disease | Admitting: Cardiovascular Disease

## 2014-06-21 DIAGNOSIS — R0602 Shortness of breath: Secondary | ICD-10-CM | POA: Diagnosis not present

## 2014-06-21 DIAGNOSIS — R0600 Dyspnea, unspecified: Secondary | ICD-10-CM | POA: Diagnosis not present

## 2014-06-21 DIAGNOSIS — R072 Precordial pain: Secondary | ICD-10-CM | POA: Diagnosis not present

## 2014-06-21 DIAGNOSIS — R079 Chest pain, unspecified: Secondary | ICD-10-CM | POA: Diagnosis not present

## 2014-06-21 DIAGNOSIS — R002 Palpitations: Secondary | ICD-10-CM | POA: Diagnosis not present

## 2014-06-21 NOTE — Procedures (Signed)
Exercise Treadmill Test     Test  Exercise Tolerance Test Ordering MD: Kirk Ruths, MD    Unique Test No: 1  Treadmill:  1  Indication for ETT: chest pain - rule out ischemia  Contraindication to ETT: No   Stress Modality: exercise - treadmill  Cardiac Imaging Performed: non   Protocol: standard Bruce - maximal  Max BP:  189/76  Max MPHR (bpm):  148 85% MPR (bpm):  125  MPHR obtained (bpm):  142 % MPHR obtained:  95  Reached 85% MPHR (min:sec):  5 Total Exercise Time (min-sec):  7:30  Workload in METS:  9.3 Borg Scale: 15  Reason ETT Terminated:  SOB and Fatigue    ST Segment Analysis At Rest: normal ST segments - no evidence of significant ST depression With Exercise: no evidence of significant ST depression  Other Information Arrhythmia:  No Angina during ETT:  absent (0) Quality of ETT:  diagnostic  ETT Interpretation:  normal - no evidence of ischemia by ST analysis  Comments: The patient had an reasonable exercise tolerance.  There was no chest pain.  There was an appropriate level of dyspnea.  There were no arrhythmias, a normal heart rate response and normal BP response.  There were no ischemic ST T wave changes and a normal heart rate recovery.  D[facility] treadmill score was in the low risk category at 8   Recommendations: No evidence of ischemia.  Further work up per Dr. Rayetta Pigg, MD

## 2014-07-17 DIAGNOSIS — M545 Low back pain: Secondary | ICD-10-CM | POA: Diagnosis not present

## 2014-07-17 DIAGNOSIS — M109 Gout, unspecified: Secondary | ICD-10-CM | POA: Diagnosis not present

## 2014-07-17 DIAGNOSIS — R531 Weakness: Secondary | ICD-10-CM | POA: Diagnosis not present

## 2014-07-17 DIAGNOSIS — Z5189 Encounter for other specified aftercare: Secondary | ICD-10-CM | POA: Diagnosis not present

## 2014-07-17 DIAGNOSIS — Z125 Encounter for screening for malignant neoplasm of prostate: Secondary | ICD-10-CM | POA: Diagnosis not present

## 2014-07-17 DIAGNOSIS — Z136 Encounter for screening for cardiovascular disorders: Secondary | ICD-10-CM | POA: Diagnosis not present

## 2014-07-17 DIAGNOSIS — E559 Vitamin D deficiency, unspecified: Secondary | ICD-10-CM | POA: Diagnosis not present

## 2014-07-17 DIAGNOSIS — Z0000 Encounter for general adult medical examination without abnormal findings: Secondary | ICD-10-CM | POA: Diagnosis not present

## 2014-07-17 DIAGNOSIS — Z8042 Family history of malignant neoplasm of prostate: Secondary | ICD-10-CM | POA: Diagnosis not present

## 2014-07-17 DIAGNOSIS — G933 Postviral fatigue syndrome: Secondary | ICD-10-CM | POA: Diagnosis not present

## 2014-07-24 ENCOUNTER — Ambulatory Visit
Admission: RE | Admit: 2014-07-24 | Discharge: 2014-07-24 | Disposition: A | Discharge status: Home / Self Care | Payer: Medicare Other | Source: Ambulatory Visit | Attending: Family Medicine | Admitting: Family Medicine

## 2014-07-24 ENCOUNTER — Other Ambulatory Visit: Payer: Self-pay | Admitting: Family Medicine

## 2014-07-24 DIAGNOSIS — R52 Pain, unspecified: Secondary | ICD-10-CM

## 2014-07-24 DIAGNOSIS — Z87442 Personal history of urinary calculi: Secondary | ICD-10-CM

## 2014-07-24 DIAGNOSIS — M549 Dorsalgia, unspecified: Secondary | ICD-10-CM | POA: Diagnosis not present

## 2014-07-24 DIAGNOSIS — N200 Calculus of kidney: Secondary | ICD-10-CM | POA: Diagnosis not present

## 2014-09-19 DIAGNOSIS — L821 Other seborrheic keratosis: Secondary | ICD-10-CM | POA: Diagnosis not present

## 2015-01-09 DIAGNOSIS — N400 Enlarged prostate without lower urinary tract symptoms: Secondary | ICD-10-CM | POA: Diagnosis not present

## 2015-01-09 DIAGNOSIS — N200 Calculus of kidney: Secondary | ICD-10-CM | POA: Diagnosis not present

## 2015-02-18 IMAGING — CR DG ABDOMEN 1V
2 series · 2 of 2 positions shown · non-contrast
Comparison: KUB film January 06, 2014

CLINICAL DATA: Recurrent left kidney stones ; history of previous
surgery in the past now with acute intermittent abdominal pain in
the left upper and lower quadrant and flank ; no fever or nausea

EXAM:
ABDOMEN - 1 VIEW

[t abdomen supine (1 of 2)]
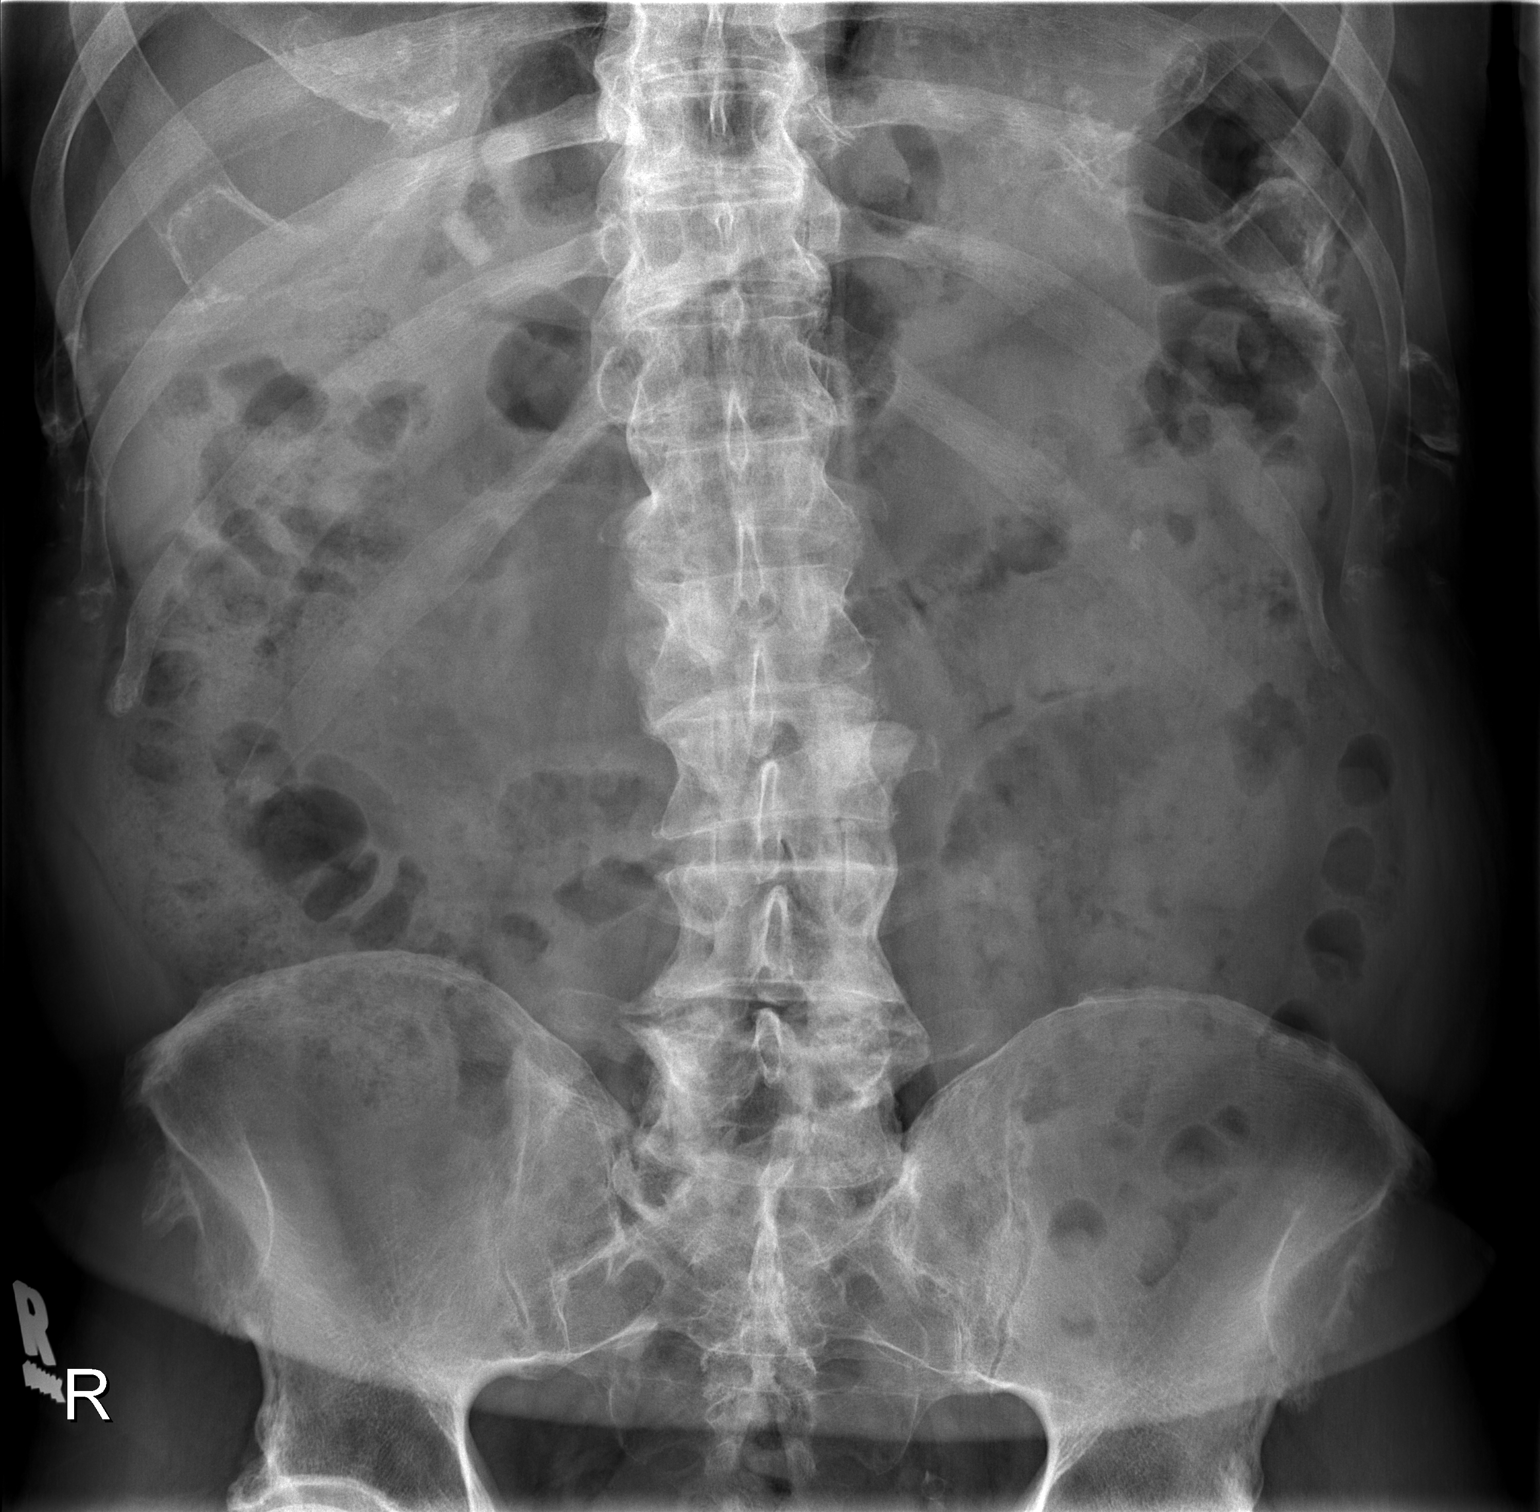

[t abdomen supine (2 of 2)]
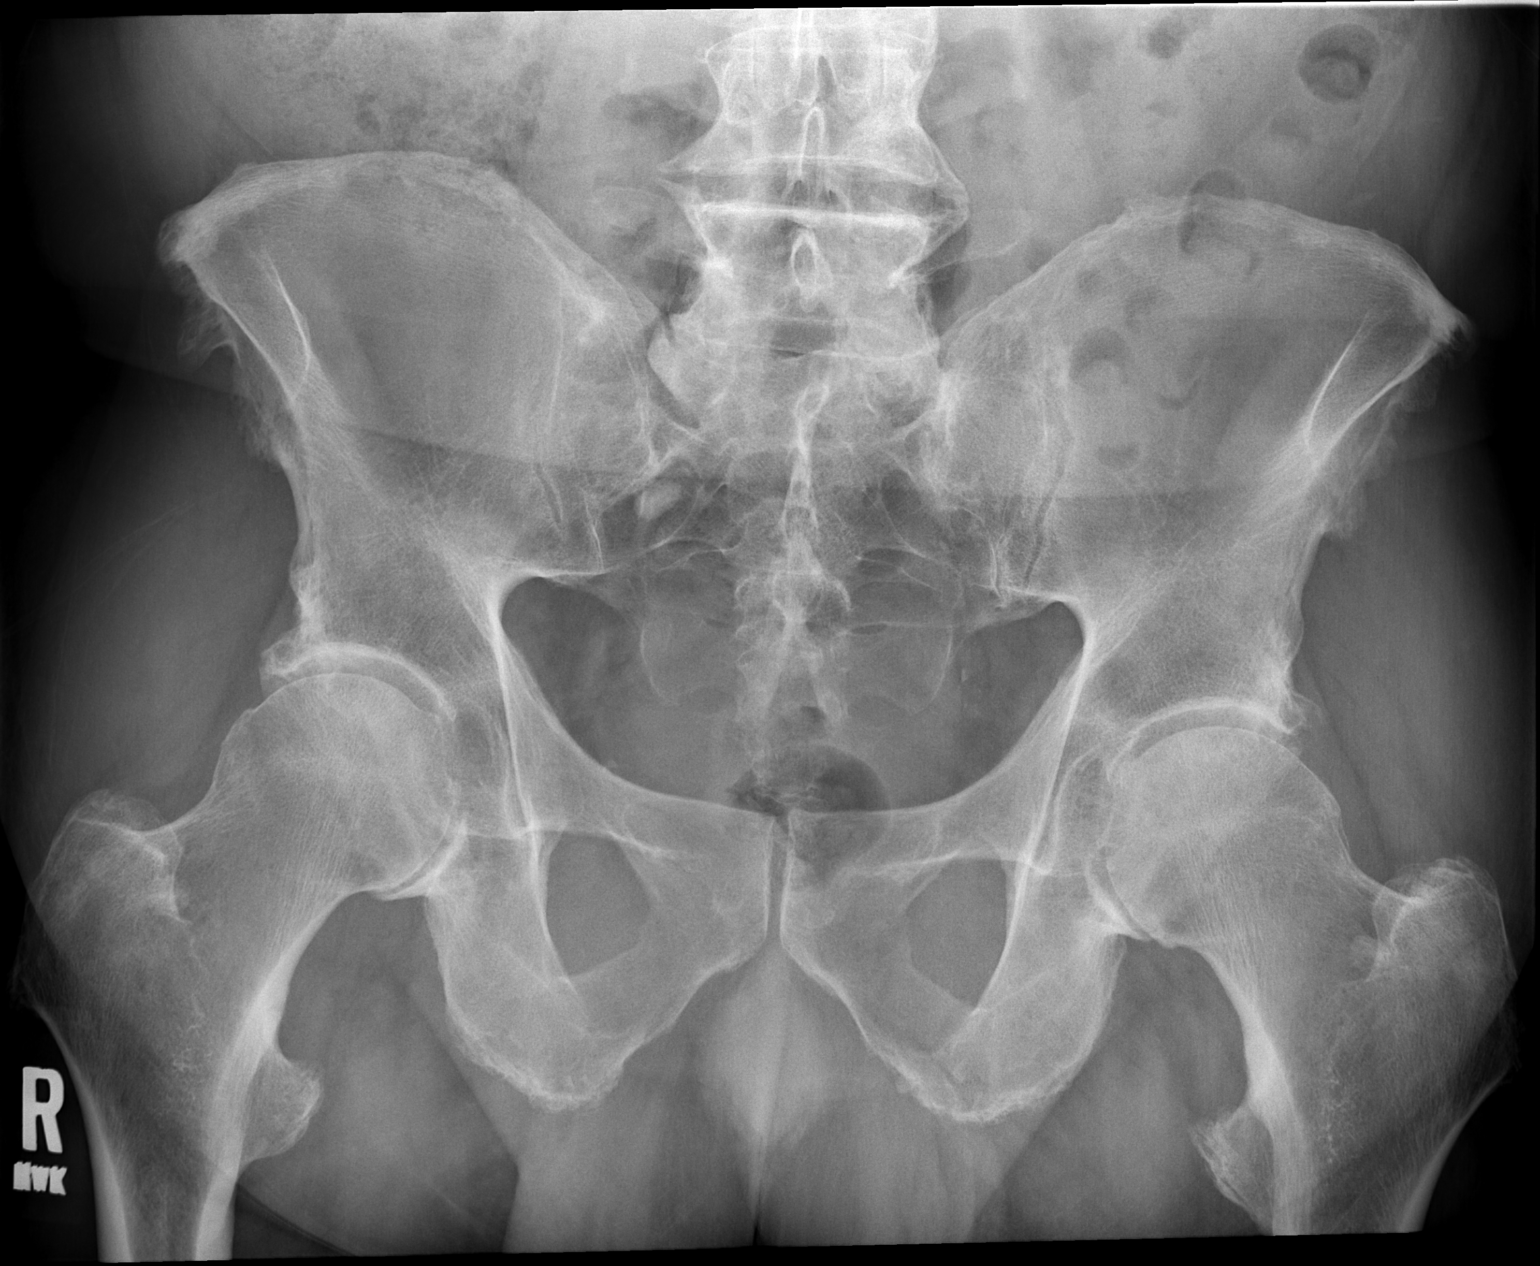

[2 of 2 positions shown; findings below may reference images not displayed]

FINDINGS: There is moderately increased stool burden within the colon. There
is no large or small bowel obstructive pattern. There is a 3 x 6 mm
diameter calcification which projects over the left renal fossa.
This is stable. There are two 2 mm diameter calcifications which
project over the right renal fossa which are also stable. Along the
course of the ureters and over the urinary bladder no stones are
evident. There are degenerative disc changes at multiple lumbar
levels.
IMPRESSION: There are stable calcifications projecting over both kidneys
consistent with stones. Increased stool burden within the colon may
reflect clinical constipation. There is mild multilevel degenerative
disc disease.

## 2015-03-12 DIAGNOSIS — H40033 Anatomical narrow angle, bilateral: Secondary | ICD-10-CM | POA: Diagnosis not present

## 2015-03-12 DIAGNOSIS — H2513 Age-related nuclear cataract, bilateral: Secondary | ICD-10-CM | POA: Diagnosis not present

## 2015-05-03 ENCOUNTER — Other Ambulatory Visit: Payer: Self-pay | Admitting: Obstetrics and Gynecology

## 2015-05-03 DIAGNOSIS — R1011 Right upper quadrant pain: Secondary | ICD-10-CM

## 2015-05-03 DIAGNOSIS — R1031 Right lower quadrant pain: Secondary | ICD-10-CM | POA: Diagnosis not present

## 2015-05-03 DIAGNOSIS — K219 Gastro-esophageal reflux disease without esophagitis: Secondary | ICD-10-CM | POA: Diagnosis not present

## 2015-05-09 ENCOUNTER — Ambulatory Visit
Admission: RE | Admit: 2015-05-09 | Discharge: 2015-05-09 | Disposition: A | Discharge status: Home / Self Care | Payer: Medicare Other | Source: Ambulatory Visit | Attending: Obstetrics and Gynecology | Admitting: Obstetrics and Gynecology

## 2015-05-09 DIAGNOSIS — R1011 Right upper quadrant pain: Secondary | ICD-10-CM

## 2015-05-09 DIAGNOSIS — K8020 Calculus of gallbladder without cholecystitis without obstruction: Secondary | ICD-10-CM | POA: Diagnosis not present

## 2015-05-09 DIAGNOSIS — N200 Calculus of kidney: Secondary | ICD-10-CM | POA: Diagnosis not present

## 2015-05-22 ENCOUNTER — Ambulatory Visit (INDEPENDENT_AMBULATORY_CARE_PROVIDER_SITE_OTHER): Payer: Medicare Other

## 2015-05-22 DIAGNOSIS — Z23 Encounter for immunization: Secondary | ICD-10-CM | POA: Diagnosis not present

## 2015-05-28 ENCOUNTER — Other Ambulatory Visit (HOSPITAL_COMMUNITY): Payer: Self-pay | Admitting: General Surgery

## 2015-05-28 DIAGNOSIS — N200 Calculus of kidney: Secondary | ICD-10-CM | POA: Diagnosis not present

## 2015-05-28 DIAGNOSIS — K8020 Calculus of gallbladder without cholecystitis without obstruction: Secondary | ICD-10-CM | POA: Diagnosis not present

## 2015-05-28 DIAGNOSIS — Z96652 Presence of left artificial knee joint: Secondary | ICD-10-CM | POA: Diagnosis not present

## 2015-05-28 DIAGNOSIS — K219 Gastro-esophageal reflux disease without esophagitis: Secondary | ICD-10-CM | POA: Diagnosis not present

## 2015-05-28 DIAGNOSIS — K4090 Unilateral inguinal hernia, without obstruction or gangrene, not specified as recurrent: Secondary | ICD-10-CM | POA: Diagnosis not present

## 2015-06-14 ENCOUNTER — Ambulatory Visit (HOSPITAL_COMMUNITY)
Admission: RE | Admit: 2015-06-14 | Discharge: 2015-06-14 | Disposition: A | Discharge status: Home / Self Care | Payer: Medicare Other | Source: Ambulatory Visit | Attending: General Surgery | Admitting: General Surgery

## 2015-06-14 ENCOUNTER — Encounter (HOSPITAL_COMMUNITY): Payer: Self-pay

## 2015-06-14 ENCOUNTER — Other Ambulatory Visit (HOSPITAL_COMMUNITY): Payer: Self-pay | Admitting: General Surgery

## 2015-06-14 DIAGNOSIS — K4000 Bilateral inguinal hernia, with obstruction, without gangrene, not specified as recurrent: Secondary | ICD-10-CM | POA: Diagnosis not present

## 2015-06-14 DIAGNOSIS — K8020 Calculus of gallbladder without cholecystitis without obstruction: Secondary | ICD-10-CM

## 2015-06-14 DIAGNOSIS — N189 Chronic kidney disease, unspecified: Secondary | ICD-10-CM | POA: Diagnosis not present

## 2015-06-14 DIAGNOSIS — N200 Calculus of kidney: Secondary | ICD-10-CM | POA: Diagnosis not present

## 2015-06-14 DIAGNOSIS — K219 Gastro-esophageal reflux disease without esophagitis: Secondary | ICD-10-CM | POA: Diagnosis not present

## 2015-06-14 DIAGNOSIS — Z96652 Presence of left artificial knee joint: Secondary | ICD-10-CM | POA: Diagnosis not present

## 2015-06-14 DIAGNOSIS — K4020 Bilateral inguinal hernia, without obstruction or gangrene, not specified as recurrent: Secondary | ICD-10-CM | POA: Insufficient documentation

## 2015-06-14 LAB — POCT I-STAT, CHEM 8
BUN: 17 mg/dL (ref 6–20)
Calcium, Ion: 1.08 mmol/L — ABNORMAL LOW (ref 1.13–1.30)
Chloride: 102 mmol/L (ref 101–111)
Creatinine, Ser: 1.1 mg/dL (ref 0.61–1.24)
Glucose, Bld: 98 mg/dL (ref 65–99)
HEMATOCRIT: 47 % (ref 39.0–52.0)
Hemoglobin: 16 g/dL (ref 13.0–17.0)
Potassium: 4.5 mmol/L (ref 3.5–5.1)
SODIUM: 140 mmol/L (ref 135–145)
TCO2: 25 mmol/L (ref 0–100)

## 2015-06-14 MED ORDER — IOHEXOL 300 MG/ML  SOLN
100.0000 mL | Freq: Once | INTRAMUSCULAR | Status: AC | PRN
  Administered 2015-06-14: 100 mL via INTRAVENOUS

## 2015-06-14 MED ORDER — IOHEXOL 300 MG/ML  SOLN
50.0000 mL | Freq: Once | INTRAMUSCULAR | Status: AC | PRN
  Administered 2015-06-14: 25 mL via ORAL

## 2015-06-15 ENCOUNTER — Ambulatory Visit (HOSPITAL_COMMUNITY)
Admission: RE | Admit: 2015-06-15 | Discharge: 2015-06-15 | Disposition: A | Discharge status: Home / Self Care | Payer: Medicare Other | Source: Ambulatory Visit | Attending: General Surgery | Admitting: General Surgery

## 2015-06-15 ENCOUNTER — Other Ambulatory Visit (HOSPITAL_COMMUNITY)
Admission: RE | Admit: 2015-06-15 | Discharge: 2015-06-15 | Disposition: A | Discharge status: Home / Self Care | Payer: Medicare Other | Source: Ambulatory Visit | Attending: General Surgery | Admitting: General Surgery

## 2015-06-15 DIAGNOSIS — K8020 Calculus of gallbladder without cholecystitis without obstruction: Secondary | ICD-10-CM | POA: Insufficient documentation

## 2015-06-15 DIAGNOSIS — R1031 Right lower quadrant pain: Secondary | ICD-10-CM | POA: Insufficient documentation

## 2015-06-15 DIAGNOSIS — K8010 Calculus of gallbladder with chronic cholecystitis without obstruction: Secondary | ICD-10-CM | POA: Insufficient documentation

## 2015-06-15 LAB — COMPREHENSIVE METABOLIC PANEL
ALBUMIN: 4 g/dL (ref 3.5–5.0)
ALK PHOS: 74 U/L (ref 38–126)
ALT: 9 U/L — ABNORMAL LOW (ref 17–63)
AST: 17 U/L (ref 15–41)
Anion gap: 8 (ref 5–15)
BILIRUBIN TOTAL: 1.6 mg/dL — AB (ref 0.3–1.2)
BUN: 16 mg/dL (ref 6–20)
CALCIUM: 8.9 mg/dL (ref 8.9–10.3)
CO2: 27 mmol/L (ref 22–32)
CREATININE: 1.03 mg/dL (ref 0.61–1.24)
Chloride: 106 mmol/L (ref 101–111)
GFR calc Af Amer: >60 mL/min (ref >60)
GFR calc non Af Amer: >60 mL/min (ref >60)
Glucose, Bld: 95 mg/dL (ref 65–99)
Potassium: 4.1 mmol/L (ref 3.5–5.1)
Sodium: 141 mmol/L (ref 135–145)
Total Protein: 6.5 g/dL (ref 6.5–8.1)

## 2015-06-15 LAB — CBC
HCT: 45.5 % (ref 39.0–52.0)
HEMOGLOBIN: 15.4 g/dL (ref 13.0–17.0)
MCH: 29 pg (ref 26.0–34.0)
MCHC: 33.8 g/dL (ref 30.0–36.0)
MCV: 85.7 fL (ref 78.0–100.0)
Platelets: 225 10*3/uL (ref 150–400)
RBC: 5.31 MIL/uL (ref 4.22–5.81)
RDW: 13.9 % (ref 11.5–15.5)
WBC: 6.7 10*3/uL (ref 4.0–10.5)

## 2015-06-15 MED ORDER — TECHNETIUM TC 99M MEBROFENIN IV KIT
5.1000 | PACK | Freq: Once | INTRAVENOUS | Status: DC | PRN
  Administered 2015-06-15: 5.1 via INTRAVENOUS
  Filled 2015-06-15: qty 6

## 2015-07-04 ENCOUNTER — Ambulatory Visit: Payer: Self-pay | Admitting: General Surgery

## 2015-07-04 DIAGNOSIS — K4000 Bilateral inguinal hernia, with obstruction, without gangrene, not specified as recurrent: Secondary | ICD-10-CM | POA: Diagnosis not present

## 2015-07-04 NOTE — H&P (Signed)
Jake Barnett 07/04/2015 10:32 AM Location: [facility] Patient #: [id number] DOB: [birth year] Married / Language: Jake Barnett / Race: White Male  History of Present Illness Jake Hollingshead MD; 07/04/2015 11:26 AM) The patient is a 74 year old male.   Note:Jake Barnett presents today to discuss laparoscopic repair of his bilateral inguinal hernias containing fat. Right side is symptomatic. He has seen Jake Barnett for this by Jake Barnett is on medical leave. Jake Barnett would like to try to have his surgery prior to August 05, 2015. No difficulty with urination. I have reviewed the CT scan and Jake Barnett notes. He has a history of diastolic dysfunction and was seen for this 13 months ago by Jake Barnett. His wife is here with him.  Allergies Elbert Ewings, CMA; 07/04/2015 10:32 AM) Indomethacin *ANALGESICS - ANTI-INFLAMMATORY*  Medication History Elbert Ewings, CMA; 07/04/2015 10:33 AM) Omeprazole (20MG  Capsule DR, Oral) Active. Ibuprofen (200MG  Tablet, Oral) Active. Medications Reconciled    Vitals Elbert Ewings CMA; 07/04/2015 10:33 AM) 07/04/2015 10:33 AM Weight: 210 lb Height: 71in Body Surface Area: 2.15 m Body Mass Index: 29.29 kg/m  Temp.: 97.67F(Temporal)  Pulse: 75 (Regular)  BP: 142/82 (Sitting, Left Arm, Standard)      Physical Exam Jake Hollingshead MD; 07/04/2015 11:26 AM)  The physical exam findings are as follows: Note:General: WDWN in NAD. Pleasant and cooperative.  CV: RRR.  CHEST: Breath sounds equal and clear. Respirations nonlabored.  ABDOMEN: Soft, nontender, no hernias.  ANORECTAL: No fissures. Normal sphincter tone. No masses.  GU: Moderate size reducible right inguinal bulge, smaller reducible left inguinal bulge.  SKIN: No jaundice.  NEUROLOGIC: Alert and oriented, answers questions appropriately, normal gait and station.  PSYCHIATRIC: Normal mood, affect , and behavior.    Assessment & Plan Jake Hollingshead MD; 07/04/2015 11:27 AM)  BILATERAL INGUINAL HERNIA WITH OBSTRUCTION AND WITHOUT GANGRENE, RECURRENCE NOT SPECIFIED (K40.00) Impression: Right side symptomatic. Most of his symptoms are in the lateral right groin.  Plan: Will send a note to Jake Barnett to see if he needs any preoperative cardiac risk assessment. Following this, we'll schedule laparoscopic bilateral inguinal hernia repair with mesh. I have explained the procedure, risks, and aftercare of inguinal hernia repair. Risks include but are not limited to bleeding, infection, wound problems, anesthesia, recurrence, bladder or intestine injury, urinary retention, testicular dysfunction, chronic pain, mesh problems. He seems to understand and agrees with the plan.  Jackolyn Confer, MD

## 2015-07-09 NOTE — Progress Notes (Signed)
HPI: FU palpitations and chest pain. Previous monitor in 2009 showed sinus with occasional PVC and PAC and short nonsustained run of atrial tachycardia (4 beats). Stress echocardiogram in May of [id number] showed diastolic dysfunction but no ECG changes and no wall motion abnormalities. Patient had a Myoview in July of 2012 and showed an ejection fraction of 66% and normal perfusion. Exercise treadmill November 2015 was negative. Patient is scheduled for bilateral hernia repair. We were asked to evaluate preoperatively. Since last seen, the patient denies any dyspnea on exertion, orthopnea, PND, pedal edema, palpitations, syncope or chest pain.   Current Outpatient Prescriptions  Medication Sig Dispense Refill  . Cholecalciferol (VITAMIN D3) 5000 UNITS TABS Take 1 tablet by mouth daily.    Mariane Baumgarten Calcium (STOOL SOFTENER PO) Take 1 tablet by mouth daily.    Marland Kitchen omeprazole (PRILOSEC) 20 MG capsule Take 20 mg by mouth 2 (two) times daily before a meal.     No current facility-administered medications for this visit.     Past Medical History  Diagnosis Date  . GERD (gastroesophageal reflux disease)   . Hyperlipidemia   . Arthritis   . H/O hiatal hernia   . History of kidney stones   . Left ureteral calculus   . Palpitations     occasional PAC/PVC.  . Mild diastolic dysfunction     AYMPTOMATIC  . PONV (postoperative nausea and vomiting)   . Chronic kidney disease     kidney stones  . RBBB   . Cancer ([place])     hx of basal cell skin cancer     Past Surgical History  Procedure Laterality Date  . Total knee arthroplasty Left 04/18/2013    Procedure: LEFT TOTAL KNEE ARTHROPLASTY;  Surgeon: Gearlean Alf, MD;  Location: [facility];  Service: Orthopedics;  Laterality: Left;  . Cysto/ bilateral laser lithotripsy with stone extractions  05-26-2011  . Transthoracic echocardiogram  08-07-2008    NORMAL LVF/  EF 55-60%  . Cardiovascular stress test  02-11-2011   DR CRENSHAW    NORMAL / NO  ISCHEMIA/ EF 66%  . Knee arthroscopy Right 2008  . Shoulder arthroscopy Right 2007  . Anterior cervical decomp/discectomy fusion  2001    C5  -- C7  . Tympanoplasty Right 1971  . Cystoscopy w/ ureteral stent placement Left 05/13/2013    Procedure: CYSTOSCOPY WITH RETROGRADE PYELOGRAM/URETERAL STENT PLACEMENT;  Surgeon: Bernestine Amass, MD;  Location: [facility];  Service: Urology;  Laterality: Left;  . Cystoscopy with retrograde pyelogram, ureteroscopy and stent placement Left 06/15/2013    Procedure: STENT REMOVAL/CYSTOSCOPY//URETEROSCOPYSTONE EXTRACTION WITH BASKET;  Surgeon: Bernestine Amass, MD;  Location: [facility];  Service: Urology;  Laterality: Left;  . Holmium laser application Left [id number]    Procedure: HOLMIUM LASER APPLICATION;  Surgeon: Bernestine Amass, MD;  Location: [facility];  Service: Urology;  Laterality: Left;    Social History   Social History  . Marital Status: Married    Spouse Name: N/A  . Number of Children: N/A  . Years of Education: N/A   Occupational History  . Not on file.   Social History Main Topics  . Smoking status: Never Smoker   . Smokeless tobacco: Never Used  . Alcohol Use: Yes     Comment: occassionally wine  . Drug Use: No  . Sexual Activity: Not on file   Other Topics Concern  . Not on file   Social History Narrative  ROS: no fevers or chills, productive cough, hemoptysis, dysphasia, odynophagia, melena, hematochezia, dysuria, hematuria, rash, seizure activity, orthopnea, PND, pedal edema, claudication. Remaining systems are negative.  Physical Exam: Well-developed well-nourished in no acute distress.  Skin is warm and dry.  HEENT is normal.  Neck is supple.  Chest is clear to auscultation with normal expansion.  Cardiovascular exam is regular rate and rhythm.  Abdominal exam nontender or distended. No masses palpated. Extremities show no edema. neuro grossly intact  ECG Normal sinus rhythm at a rate of 72. No ST  changes.

## 2015-07-10 ENCOUNTER — Ambulatory Visit (INDEPENDENT_AMBULATORY_CARE_PROVIDER_SITE_OTHER): Payer: Medicare Other | Admitting: Cardiology

## 2015-07-10 ENCOUNTER — Encounter: Payer: Self-pay | Admitting: Cardiology

## 2015-07-10 DIAGNOSIS — Z01810 Encounter for preprocedural cardiovascular examination: Secondary | ICD-10-CM | POA: Diagnosis not present

## 2015-07-10 DIAGNOSIS — I10 Essential (primary) hypertension: Secondary | ICD-10-CM | POA: Diagnosis not present

## 2015-07-10 NOTE — Assessment & Plan Note (Signed)
Blood pressure is elevated today but he follows this at home and his systolic is [id number] typically. He will follow this and we will add medications as needed.

## 2015-07-10 NOTE — Assessment & Plan Note (Signed)
Resolved

## 2015-07-10 NOTE — Assessment & Plan Note (Signed)
Patient is scheduled for inguinal hernia surgery. He had an exercise treadmill approximately one year ago that was normal. It is a low risk surgery. His functional capacity is good with no chest pain. He does not require further cardiac evaluation preoperatively.

## 2015-07-10 NOTE — Patient Instructions (Signed)
Your physician wants you to follow-up in: ONE YEAR WITH DR CRENSHAW You will receive a reminder letter in the mail two months in advance. If you don't receive a letter, please call our office to schedule the follow-up appointment.   If you need a refill on your cardiac medications before your next appointment, please call your pharmacy.  

## 2015-08-21 NOTE — Patient Instructions (Addendum)
Jake Barnett  08/21/2015   Your procedure is scheduled on: 08-23-14 Friday  Report to [facility] Main  Entrance take [facility]  elevators to 3rd floor to  [place] at  0900  AM.  Call this number if you have problems the morning of surgery [phone]   Remember: ONLY 1 PERSON MAY GO WITH YOU TO SHORT STAY TO GET  READY MORNING OF [place].  Do not eat food or drink liquids :After Midnight.     Take these medicines the morning of surgery with A SIP OF WATER: Omeprazole(Prilosec). DO NOT TAKE ANY DIABETIC MEDICATIONS DAY OF YOUR SURGERY                               You may not have any metal on your body including hair pins and              piercings  Do not wear jewelry, make-up, lotions, powders or perfumes, deodorant             Do not wear nail polish.  Do not shave  48 hours prior to surgery.              Men may shave face and neck.   Do not bring valuables to the hospital. [place].  Contacts, dentures or bridgework may not be worn into surgery.  Leave suitcase in the car. After surgery it may be brought to your room.     Patients discharged the day of surgery will not be allowed to drive home.  Name and phone number of your driver: Katharine Look -spouse [phone] cell  Special Instructions: N/A              Please read over the following fact sheets you were given: _____________________________________________________________________             [facility] - Preparing for Surgery Before surgery, you can play an important role.  Because skin is not sterile, your skin needs to be as free of germs as possible.  You can reduce the number of germs on your skin by washing with CHG (chlorahexidine gluconate) soap before surgery.  CHG is an antiseptic cleaner which kills germs and bonds with the skin to continue killing germs even after washing. Please DO NOT use if you have an allergy to CHG or  antibacterial soaps.  If your skin becomes reddened/irritated stop using the CHG and inform your nurse when you arrive at Short Stay. Do not shave (including legs and underarms) for at least 48 hours prior to the first CHG shower.  You may shave your face/neck. Please follow these instructions carefully:  1.  Shower with CHG Soap the night before surgery and the  morning of Surgery.  2.  If you choose to wash your hair, wash your hair first as usual with your  normal  shampoo.  3.  After you shampoo, rinse your hair and body thoroughly to remove the  shampoo.                           4.  Use CHG as you would any other liquid soap.  You can apply chg directly  to the skin and wash                       Gently with a scrungie or clean washcloth.  5.  Apply the CHG Soap to your body ONLY FROM THE NECK DOWN.   Do not use on face/ open                           Wound or open sores. Avoid contact with eyes, ears mouth and genitals (private parts).                       Wash face,  Genitals (private parts) with your normal soap.             6.  Wash thoroughly, paying special attention to the area where your surgery  will be performed.  7.  Thoroughly rinse your body with warm water from the neck down.  8.  DO NOT shower/wash with your normal soap after using and rinsing off  the CHG Soap.                9.  Pat yourself dry with a clean towel.            10.  Wear clean pajamas.            11.  Place clean sheets on your bed the night of your first shower and do not  sleep with pets. Day of Surgery : Do not apply any lotions/deodorants the morning of surgery.  Please wear clean clothes to the hospital/surgery center.  FAILURE TO FOLLOW THESE INSTRUCTIONS MAY RESULT IN THE CANCELLATION OF YOUR SURGERY PATIENT SIGNATURE_________________________________  NURSE SIGNATURE__________________________________  ________________________________________________________________________

## 2015-08-22 ENCOUNTER — Encounter (HOSPITAL_COMMUNITY): Payer: Self-pay

## 2015-08-22 ENCOUNTER — Encounter (HOSPITAL_COMMUNITY)
Admission: RE | Admit: 2015-08-22 | Discharge: 2015-08-22 | Disposition: A | Discharge status: Home / Self Care | Payer: Medicare Other | Source: Ambulatory Visit | Attending: General Surgery | Admitting: General Surgery

## 2015-08-22 DIAGNOSIS — M1990 Unspecified osteoarthritis, unspecified site: Secondary | ICD-10-CM | POA: Diagnosis not present

## 2015-08-22 DIAGNOSIS — K4020 Bilateral inguinal hernia, without obstruction or gangrene, not specified as recurrent: Secondary | ICD-10-CM | POA: Diagnosis not present

## 2015-08-22 DIAGNOSIS — K219 Gastro-esophageal reflux disease without esophagitis: Secondary | ICD-10-CM | POA: Diagnosis not present

## 2015-08-22 DIAGNOSIS — K449 Diaphragmatic hernia without obstruction or gangrene: Secondary | ICD-10-CM | POA: Diagnosis not present

## 2015-08-22 DIAGNOSIS — Z79899 Other long term (current) drug therapy: Secondary | ICD-10-CM | POA: Diagnosis not present

## 2015-08-22 DIAGNOSIS — I10 Essential (primary) hypertension: Secondary | ICD-10-CM | POA: Diagnosis not present

## 2015-08-22 DIAGNOSIS — R195 Other fecal abnormalities: Secondary | ICD-10-CM | POA: Diagnosis not present

## 2015-08-22 LAB — COMPREHENSIVE METABOLIC PANEL
ALT: 10 U/L — ABNORMAL LOW (ref 17–63)
ANION GAP: 8 (ref 5–15)
AST: 17 U/L (ref 15–41)
Albumin: 4.1 g/dL (ref 3.5–5.0)
Alkaline Phosphatase: 77 U/L (ref 38–126)
BUN: 17 mg/dL (ref 6–20)
CHLORIDE: 107 mmol/L (ref 101–111)
CO2: 27 mmol/L (ref 22–32)
Calcium: 9.1 mg/dL (ref 8.9–10.3)
Creatinine, Ser: 1.2 mg/dL (ref 0.61–1.24)
GFR calc non Af Amer: 58 mL/min — ABNORMAL LOW (ref >60)
Glucose, Bld: 96 mg/dL (ref 65–99)
POTASSIUM: 4.2 mmol/L (ref 3.5–5.1)
SODIUM: 142 mmol/L (ref 135–145)
Total Bilirubin: 1.1 mg/dL (ref 0.3–1.2)
Total Protein: 6.7 g/dL (ref 6.5–8.1)

## 2015-08-22 LAB — CBC WITH DIFFERENTIAL/PLATELET
Basophils Absolute: 0 10*3/uL (ref 0.0–0.1)
Basophils Relative: 0 %
EOS ABS: 0.1 10*3/uL (ref 0.0–0.7)
EOS PCT: 1 %
HCT: 44.6 % (ref 39.0–52.0)
Hemoglobin: 14.9 g/dL (ref 13.0–17.0)
LYMPHS ABS: 1.3 10*3/uL (ref 0.7–4.0)
Lymphocytes Relative: 16 %
MCH: 28.5 pg (ref 26.0–34.0)
MCHC: 33.4 g/dL (ref 30.0–36.0)
MCV: 85.3 fL (ref 78.0–100.0)
MONO ABS: 0.7 10*3/uL (ref 0.1–1.0)
MONOS PCT: 9 %
Neutro Abs: 6.2 10*3/uL (ref 1.7–7.7)
Neutrophils Relative %: 74 %
PLATELETS: 213 10*3/uL (ref 150–400)
RBC: 5.23 MIL/uL (ref 4.22–5.81)
RDW: 13.8 % (ref 11.5–15.5)
WBC: 8.3 10*3/uL (ref 4.0–10.5)

## 2015-08-22 LAB — PROTIME-INR
INR: 1.02 (ref 0.00–1.49)
Prothrombin Time: 13.6 seconds (ref 11.6–15.2)

## 2015-08-22 NOTE — Pre-Procedure Instructions (Signed)
12'16 EKG Epic. Stress 11'15 Epic.

## 2015-08-24 ENCOUNTER — Ambulatory Visit (HOSPITAL_COMMUNITY): Payer: Medicare Other | Admitting: Certified Registered"

## 2015-08-24 ENCOUNTER — Encounter (HOSPITAL_COMMUNITY): Payer: Self-pay | Admitting: *Deleted

## 2015-08-24 ENCOUNTER — Encounter (HOSPITAL_COMMUNITY)
Admission: RE | Disposition: A | Discharge status: Home / Self Care | Payer: Self-pay | Source: Ambulatory Visit | Attending: General Surgery

## 2015-08-24 ENCOUNTER — Ambulatory Visit (HOSPITAL_COMMUNITY)
Admission: RE | Admit: 2015-08-24 | Discharge: 2015-08-24 | Disposition: A | Discharge status: Home / Self Care | Payer: Medicare Other | Source: Ambulatory Visit | Attending: General Surgery | Admitting: General Surgery

## 2015-08-24 DIAGNOSIS — K4020 Bilateral inguinal hernia, without obstruction or gangrene, not specified as recurrent: Secondary | ICD-10-CM | POA: Diagnosis not present

## 2015-08-24 DIAGNOSIS — K219 Gastro-esophageal reflux disease without esophagitis: Secondary | ICD-10-CM | POA: Diagnosis not present

## 2015-08-24 DIAGNOSIS — K449 Diaphragmatic hernia without obstruction or gangrene: Secondary | ICD-10-CM | POA: Diagnosis not present

## 2015-08-24 DIAGNOSIS — I129 Hypertensive chronic kidney disease with stage 1 through stage 4 chronic kidney disease, or unspecified chronic kidney disease: Secondary | ICD-10-CM | POA: Diagnosis not present

## 2015-08-24 DIAGNOSIS — N189 Chronic kidney disease, unspecified: Secondary | ICD-10-CM | POA: Diagnosis not present

## 2015-08-24 DIAGNOSIS — M1990 Unspecified osteoarthritis, unspecified site: Secondary | ICD-10-CM | POA: Diagnosis not present

## 2015-08-24 DIAGNOSIS — Z79899 Other long term (current) drug therapy: Secondary | ICD-10-CM | POA: Diagnosis not present

## 2015-08-24 DIAGNOSIS — I10 Essential (primary) hypertension: Secondary | ICD-10-CM | POA: Insufficient documentation

## 2015-08-24 DIAGNOSIS — T884XXA Failed or difficult intubation, initial encounter: Secondary | ICD-10-CM

## 2015-08-24 DIAGNOSIS — R195 Other fecal abnormalities: Secondary | ICD-10-CM | POA: Insufficient documentation

## 2015-08-24 HISTORY — PX: INGUINAL HERNIA REPAIR: SHX194

## 2015-08-24 HISTORY — DX: Failed or difficult intubation, initial encounter: T88.4XXA

## 2015-08-24 HISTORY — PX: INSERTION OF MESH: SHX5868

## 2015-08-24 SURGERY — REPAIR, HERNIA, INGUINAL, BILATERAL, LAPAROSCOPIC
Anesthesia: General | Laterality: Bilateral

## 2015-08-24 MED ORDER — PHENYLEPHRINE HCL 10 MG/ML IJ SOLN
INTRAMUSCULAR | Status: DC | PRN
  Administered 2015-08-24 (×2): 80 ug via INTRAVENOUS

## 2015-08-24 MED ORDER — BUPIVACAINE-EPINEPHRINE 0.5% -1:200000 IJ SOLN
INTRAMUSCULAR | Status: DC | PRN
  Administered 2015-08-24: 11 mL

## 2015-08-24 MED ORDER — GLYCOPYRROLATE 0.2 MG/ML IJ SOLN
INTRAMUSCULAR | Status: AC
  Filled 2015-08-24: qty 3

## 2015-08-24 MED ORDER — MIDAZOLAM HCL 2 MG/2ML IJ SOLN
INTRAMUSCULAR | Status: AC
  Filled 2015-08-24: qty 2

## 2015-08-24 MED ORDER — 0.9 % SODIUM CHLORIDE (POUR BTL) OPTIME
TOPICAL | Status: DC | PRN
  Administered 2015-08-24: 1000 mL

## 2015-08-24 MED ORDER — ONDANSETRON HCL 4 MG/2ML IJ SOLN
INTRAMUSCULAR | Status: AC
  Filled 2015-08-24: qty 2

## 2015-08-24 MED ORDER — SODIUM CHLORIDE 0.9 % IJ SOLN: 3.0000 mL | INTRAMUSCULAR | Status: DC | PRN

## 2015-08-24 MED ORDER — ONDANSETRON HCL 4 MG/2ML IJ SOLN
INTRAMUSCULAR | Status: DC | PRN
  Administered 2015-08-24: 4 mg via INTRAVENOUS

## 2015-08-24 MED ORDER — ACETAMINOPHEN 650 MG RE SUPP
650.0000 mg | RECTAL | Status: DC | PRN
  Filled 2015-08-24: qty 1

## 2015-08-24 MED ORDER — SUGAMMADEX SODIUM 200 MG/2ML IV SOLN
INTRAVENOUS | Status: DC | PRN
  Administered 2015-08-24: 200 mg via INTRAVENOUS

## 2015-08-24 MED ORDER — BUPIVACAINE-EPINEPHRINE (PF) 0.5% -1:200000 IJ SOLN
INTRAMUSCULAR | Status: AC
  Filled 2015-08-24: qty 30

## 2015-08-24 MED ORDER — SUGAMMADEX SODIUM 200 MG/2ML IV SOLN
INTRAVENOUS | Status: AC
  Filled 2015-08-24: qty 2

## 2015-08-24 MED ORDER — ACETAMINOPHEN 325 MG PO TABS: 650.0000 mg | ORAL_TABLET | ORAL | Status: DC | PRN

## 2015-08-24 MED ORDER — FENTANYL CITRATE (PF) 250 MCG/5ML IJ SOLN
INTRAMUSCULAR | Status: AC
  Filled 2015-08-24: qty 5

## 2015-08-24 MED ORDER — LACTATED RINGERS IV SOLN
INTRAVENOUS | Status: DC
  Administered 2015-08-24: 1000 mL via INTRAVENOUS

## 2015-08-24 MED ORDER — SUCCINYLCHOLINE CHLORIDE 20 MG/ML IJ SOLN
INTRAMUSCULAR | Status: DC | PRN
  Administered 2015-08-24: 100 mg via INTRAVENOUS

## 2015-08-24 MED ORDER — HYDROMORPHONE HCL 1 MG/ML IJ SOLN
0.2500 mg | INTRAMUSCULAR | Status: DC | PRN
  Administered 2015-08-24: 0.5 mg via INTRAVENOUS

## 2015-08-24 MED ORDER — DEXAMETHASONE SODIUM PHOSPHATE 10 MG/ML IJ SOLN
INTRAMUSCULAR | Status: AC
  Filled 2015-08-24: qty 1

## 2015-08-24 MED ORDER — CEFAZOLIN SODIUM-DEXTROSE 2-3 GM-% IV SOLR
INTRAVENOUS | Status: AC
  Filled 2015-08-24: qty 50

## 2015-08-24 MED ORDER — OXYCODONE HCL 5 MG PO TABS: 5.0000 mg | ORAL_TABLET | ORAL | Status: DC | PRN

## 2015-08-24 MED ORDER — ROCURONIUM BROMIDE 100 MG/10ML IV SOLN
INTRAVENOUS | Status: DC | PRN
  Administered 2015-08-24: 50 mg via INTRAVENOUS
  Administered 2015-08-24 (×2): 10 mg via INTRAVENOUS

## 2015-08-24 MED ORDER — PROPOFOL 10 MG/ML IV BOLUS
INTRAVENOUS | Status: AC
  Filled 2015-08-24: qty 20

## 2015-08-24 MED ORDER — PROMETHAZINE HCL 25 MG/ML IJ SOLN
6.2500 mg | INTRAMUSCULAR | Status: DC | PRN
Start: 2015-08-24 — End: 2015-08-24
  Administered 2015-08-24: 6.25 mg via INTRAVENOUS
  Filled 2015-08-24: qty 1

## 2015-08-24 MED ORDER — LACTATED RINGERS IR SOLN
Status: DC | PRN
  Administered 2015-08-24: 1000 mL

## 2015-08-24 MED ORDER — LIDOCAINE HCL (PF) 2 % IJ SOLN
INTRAMUSCULAR | Status: DC | PRN
  Administered 2015-08-24: 30 mg via INTRADERMAL

## 2015-08-24 MED ORDER — OXYCODONE HCL 5 MG PO TABS
5.0000 mg | ORAL_TABLET | ORAL | Status: DC | PRN
  Administered 2015-08-24: 5 mg via ORAL
  Filled 2015-08-24: qty 1

## 2015-08-24 MED ORDER — HYDROMORPHONE HCL 1 MG/ML IJ SOLN
INTRAMUSCULAR | Status: AC
  Filled 2015-08-24: qty 1

## 2015-08-24 MED ORDER — MORPHINE SULFATE (PF) 10 MG/ML IV SOLN: 2.0000 mg | INTRAVENOUS | Status: DC | PRN

## 2015-08-24 MED ORDER — CEFAZOLIN SODIUM-DEXTROSE 2-3 GM-% IV SOLR
2.0000 g | INTRAVENOUS | Status: AC
  Administered 2015-08-24: 2 g via INTRAVENOUS

## 2015-08-24 MED ORDER — MEPERIDINE HCL 50 MG/ML IJ SOLN: 6.2500 mg | INTRAMUSCULAR | Status: DC | PRN

## 2015-08-24 MED ORDER — DEXAMETHASONE SODIUM PHOSPHATE 10 MG/ML IJ SOLN
INTRAMUSCULAR | Status: DC | PRN
  Administered 2015-08-24: 10 mg via INTRAVENOUS

## 2015-08-24 MED ORDER — EPHEDRINE SULFATE 50 MG/ML IJ SOLN
INTRAMUSCULAR | Status: DC | PRN
  Administered 2015-08-24: 10 mg via INTRAVENOUS

## 2015-08-24 MED ORDER — ROCURONIUM BROMIDE 100 MG/10ML IV SOLN
INTRAVENOUS | Status: AC
  Filled 2015-08-24: qty 1

## 2015-08-24 MED ORDER — MIDAZOLAM HCL 5 MG/5ML IJ SOLN
INTRAMUSCULAR | Status: DC | PRN
  Administered 2015-08-24: 2 mg via INTRAVENOUS

## 2015-08-24 MED ORDER — PROPOFOL 10 MG/ML IV BOLUS
INTRAVENOUS | Status: DC | PRN
Start: 2015-08-24 — End: 2015-08-24
  Administered 2015-08-24: 150 mg via INTRAVENOUS

## 2015-08-24 MED ORDER — FENTANYL CITRATE (PF) 100 MCG/2ML IJ SOLN
INTRAMUSCULAR | Status: DC | PRN
  Administered 2015-08-24 (×3): 50 ug via INTRAVENOUS
  Administered 2015-08-24: 100 ug via INTRAVENOUS

## 2015-08-24 MED FILL — oxyCODONE HCL 5 MG TABS: 5 | 4 days supply | Qty: 40 | Fill #0

## 2015-08-24 SURGICAL SUPPLY — 43 items
APL SKNCLS STERI-STRIP NONHPOA (GAUZE/BANDAGES/DRESSINGS) ×1
APPLIER CLIP 5 13 M/L LIGAMAX5 (MISCELLANEOUS) ×3
APR CLP MED LRG 5 ANG JAW (MISCELLANEOUS) ×1
BENZOIN TINCTURE PRP APPL 2/3 (GAUZE/BANDAGES/DRESSINGS) ×3 IMPLANT
CABLE HIGH FREQUENCY MONO STRZ (ELECTRODE) ×2 IMPLANT
CHLORAPREP W/TINT 26ML (MISCELLANEOUS) ×3 IMPLANT
CLIP APPLIE 5 13 M/L LIGAMAX5 (MISCELLANEOUS) IMPLANT
CLOSURE WOUND 1/2 X4 (GAUZE/BANDAGES/DRESSINGS) ×1
COVER SURGICAL LIGHT HANDLE (MISCELLANEOUS) ×3 IMPLANT
DISSECT BALLN SPACEMKR + OVL (BALLOONS) ×3
DISSECTOR BALLN SPACEMKR + OVL (BALLOONS) ×1 IMPLANT
DISSECTOR BLUNT TIP ENDO 5MM (MISCELLANEOUS) ×3 IMPLANT
DRAPE LAPAROSCOPIC ABDOMINAL (DRAPES) ×3 IMPLANT
DRAPE UTILITY XL STRL (DRAPES) ×3 IMPLANT
DRSG TEGADERM 2-3/8X2-3/4 SM (GAUZE/BANDAGES/DRESSINGS) ×4 IMPLANT
DRSG TEGADERM 4X4.75 (GAUZE/BANDAGES/DRESSINGS) ×2 IMPLANT
ELECT REM PT RETURN 9FT ADLT (ELECTROSURGICAL) ×3
ELECTRODE REM PT RTRN 9FT ADLT (ELECTROSURGICAL) ×1 IMPLANT
GAUZE SPONGE 2X2 8PLY STRL LF (GAUZE/BANDAGES/DRESSINGS) IMPLANT
GLOVE BIOGEL PI IND STRL 6.5 (GLOVE) IMPLANT
GLOVE BIOGEL PI IND STRL 7.0 (GLOVE) ×1 IMPLANT
GLOVE BIOGEL PI INDICATOR 6.5 (GLOVE) ×2
GLOVE BIOGEL PI INDICATOR 7.0 (GLOVE) ×4
GLOVE ECLIPSE 8.0 STRL XLNG CF (GLOVE) ×3 IMPLANT
GLOVE INDICATOR 6.5 STRL GRN (GLOVE) ×2 IMPLANT
GLOVE INDICATOR 8.0 STRL GRN (GLOVE) ×6 IMPLANT
GOWN STRL REUS W/TWL LRG LVL3 (GOWN DISPOSABLE) ×3 IMPLANT
GOWN STRL REUS W/TWL XL LVL3 (GOWN DISPOSABLE) ×6 IMPLANT
KIT BASIN OR (CUSTOM PROCEDURE TRAY) ×3 IMPLANT
MARKER SKIN DUAL TIP RULER LAB (MISCELLANEOUS) ×3 IMPLANT
MESH HERNIA 6X6 BARD (Mesh General) IMPLANT
MESH HERNIA BARD 6X6 (Mesh General) ×4 IMPLANT
SCISSORS LAP 5X35 DISP (ENDOMECHANICALS) ×2 IMPLANT
SET IRRIG TUBING LAPAROSCOPIC (IRRIGATION / IRRIGATOR) ×2 IMPLANT
SOLUTION ANTI FOG 6CC (MISCELLANEOUS) ×3 IMPLANT
SPONGE GAUZE 2X2 STER 10/PKG (GAUZE/BANDAGES/DRESSINGS) ×2
STRIP CLOSURE SKIN 1/2X4 (GAUZE/BANDAGES/DRESSINGS) ×2 IMPLANT
SUT MNCRL AB 4-0 PS2 18 (SUTURE) ×3 IMPLANT
TACKER 5MM HERNIA 3.5CML NAB (ENDOMECHANICALS) ×2 IMPLANT
TOWEL OR 17X26 10 PK STRL BLUE (TOWEL DISPOSABLE) ×3 IMPLANT
TRAY LAPAROSCOPIC (CUSTOM PROCEDURE TRAY) ×3 IMPLANT
TROCAR CANNULA W/PORT DUAL 5MM (MISCELLANEOUS) ×6 IMPLANT
TUBING INSUFFLATION 10FT LAP (TUBING) ×3 IMPLANT

## 2015-08-24 NOTE — Anesthesia Procedure Notes (Signed)
Procedure Name: Intubation Date/Time: 08/24/2015 11:51 AM Performed by: Lajuana Carry E Pre-anesthesia Checklist: Patient identified, Emergency Drugs available, Suction available and Patient being monitored Patient Re-evaluated:Patient Re-evaluated prior to inductionOxygen Delivery Method: Circle System Utilized Preoxygenation: Pre-oxygenation with 100% oxygen Intubation Type: IV induction Ventilation: Mask ventilation without difficulty Laryngoscope Size: Miller, 3 and Glidescope Grade View: Grade IV Tube type: Oral Tube size: 7.5 mm Number of attempts: 2 Airway Equipment and Method: Video-laryngoscopy Placement Confirmation: ETT inserted through vocal cords under direct vision,  positive ETCO2 and breath sounds checked- equal and bilateral Secured at: 21 cm Tube secured with: Tape Dental Injury: Teeth and Oropharynx as per pre-operative assessment  Difficulty Due To: Difficulty was unanticipated, Difficult Airway- due to limited oral opening and Difficult Airway- due to reduced neck mobility Comments: Initial DL w/ Miller, 0 view, Easy mask ventilation Glidescope used w/ Grade 1 view, Atraumatic intubation, teeth, lips as pre op

## 2015-08-24 NOTE — Transfer of Care (Signed)
Immediate Anesthesia Transfer of Care Note  Patient: Jake Barnett  Procedure(s) Performed: Procedure(s): LAPAROSCOPIC BILATERAL INGUINAL HERNIA REPAIR  (Bilateral) WITH INSERTION OF MESH (Bilateral)  Patient Location: PACU  Anesthesia Type:General  Level of Consciousness:  sedated, patient cooperative and responds to stimulation  Airway & Oxygen Therapy:Patient Spontanous Breathing and Patient connected to face mask oxgen  Post-op Assessment:  Report given to PACU RN and Post -op Vital signs reviewed and stable  Post vital signs:  Reviewed and stable  Last Vitals:  Filed Vitals:   08/24/15 0854  BP: 140/79  Pulse: 72  Temp: 36.5 C  Resp: 18    Complications: No apparent anesthesia complications

## 2015-08-24 NOTE — Anesthesia Postprocedure Evaluation (Signed)
Anesthesia Post Note  Patient: Jake Barnett  Procedure(s) Performed: Procedure(s) (LRB): LAPAROSCOPIC BILATERAL INGUINAL HERNIA REPAIR  (Bilateral) WITH INSERTION OF MESH (Bilateral)  Patient location during evaluation: PACU Anesthesia Type: General Level of consciousness: awake and alert Pain management: pain level controlled Vital Signs Assessment: post-procedure vital signs reviewed and stable Respiratory status: spontaneous breathing, nonlabored ventilation, respiratory function stable and patient connected to nasal cannula oxygen Cardiovascular status: blood pressure returned to baseline and stable Postop Assessment: no signs of nausea or vomiting Anesthetic complications: no    Last Vitals:  Filed Vitals:   08/24/15 1530 08/24/15 1540  BP: 143/66 133/71  Pulse: 84 85  Temp: 37 C 36.5 C  Resp: 17 16    Last Pain:  Filed Vitals:   08/24/15 1542  PainSc: Butler Alec Mcphee

## 2015-08-24 NOTE — Anesthesia Preprocedure Evaluation (Addendum)
Anesthesia Evaluation  Patient identified by MRN, date of birth, ID band Patient awake    Reviewed: Allergy & Precautions, NPO status , Patient's Chart, lab work & pertinent test results  History of Anesthesia Complications (+) PONV and history of anesthetic complications  Airway Mallampati: II  TM Distance: <3 FB Neck ROM: Limited    Dental  (+) Teeth Intact   Pulmonary shortness of breath,    breath sounds clear to auscultation       Cardiovascular hypertension, + dysrhythmias  Rhythm:Regular Rate:Normal     Neuro/Psych negative neurological ROS  negative psych ROS   GI/Hepatic Neg liver ROS, hiatal hernia, GERD  Medicated,  Endo/Other  negative endocrine ROS  Renal/GU CRFRenal disease  negative genitourinary   Musculoskeletal  (+) Arthritis , Osteoarthritis,    Abdominal   Peds negative pediatric ROS (+)  Hematology  (+) anemia ,   Anesthesia Other Findings   Reproductive/Obstetrics negative OB ROS                            Lab Results  Component Value Date   WBC 8.3 08/22/2015   HGB 14.9 08/22/2015   HCT 44.6 08/22/2015   MCV 85.3 08/22/2015   PLT 213 08/22/2015   Lab Results  Component Value Date   CREATININE 1.20 08/22/2015   BUN 17 08/22/2015   NA 142 08/22/2015   K 4.2 08/22/2015   CL 107 08/22/2015   CO2 27 08/22/2015   Lab Results  Component Value Date   INR 1.02 08/22/2015   INR 0.95 04/12/2013   07/2015 EKG: normal sinus rhythm.    Anesthesia Physical Anesthesia Plan  ASA: III  Anesthesia Plan: General   Post-op Pain Management:    Induction: Intravenous  Airway Management Planned: Oral ETT  Additional Equipment:   Intra-op Plan:   Post-operative Plan: Extubation in OR  Informed Consent: I have reviewed the patients History and Physical, chart, labs and discussed the procedure including the risks, benefits and alternatives for the proposed  anesthesia with the patient or authorized representative who has indicated his/her understanding and acceptance.   Dental advisory given  Plan Discussed with: CRNA  Anesthesia Plan Comments:         Anesthesia Quick Evaluation

## 2015-08-24 NOTE — Interval H&P Note (Signed)
History and Physical Interval Note:  08/24/2015 11:27 AM  Jake Barnett  has presented today for surgery, with the diagnosis of BILATERAL INGUINAL HERNIA   The various methods of treatment have been discussed with the patient and family. After consideration of risks, benefits and other options for treatment, the patient has consented to  Procedure(s): LAPAROSCOPIC BILATERAL INGUINAL HERNIA REPAIR  (Bilateral) WITH INSERTION OF MESH (Bilateral) as a surgical intervention .  The patient's history has been reviewed, patient examined, no change in status, stable for surgery.  I have reviewed the patient's chart and labs.  Questions were answered to the patient's satisfaction.     Alohilani Levenhagen Lenna Sciara

## 2015-08-24 NOTE — Op Note (Signed)
Operative Note  Jake Barnett male 75 y.o. 08/24/2015  PREOPERATIVE DX:  Bilateral inguinal hernias  POSTOPERATIVE DX:  Same (Indirect hernias)  PROCEDURE:   Laparoscopic repair bilateral inguinal hernias with mesh         Surgeon: Odis Hollingshead   Assistants: None  Anesthesia: General endotracheal anesthesia  Indications:   This is a 75 year old male who has bilateral fat-containing inguinal hernias right side larger than left. Right side is symptomatic. He now presents for elective repair. He said preoperative cardiac clearance.    Procedure Detail:  He was seen in the holding room. He voided. He is brought to the operating room and placed supine on the operating table and a general anesthetic was given. Hair on the abdominal wall and groin areas was clipped. These areas were then widely sterilely prepped and draped. A timeout was performed.  Marcaine was infiltrated in the subumbilical area. A small, transverse subumbilical incision was made through the skin and subcutaneous tissue. The right anterior rectus sheath was identified. A small incision was made in it. The underlying right rectus muscle was swept laterally exposing the posterior rectus sheath. A balloon dissection device was then placed into the extraperitoneal space and under laparoscopic vision balloon dissection was performed. The balloon was removed and CO2 gas was insufflated.  Two 5 mm trocars were placed in the lower midline.  Using blunt dissection, the symphysis pubis was identified. Cooper's ligament was identified bilaterally. Beginning on the left side, extraperitoneal tissues dissected free from the anterior and lateral abdominal walls. The hernia was identified. The left side was approached and extraperitoneal was dissected free from the lateral and anterior abdominal walls. The spermatic cord was identified and the left side and isolated. An indirect hernia was noted. The sac and extra peritoneal contents  were densely adherent to the spermatic cord structures. Using blunt dissection, the hernia was reduced and the peritoneal sac dissected free from the spermatic cord back to approximately the level of the umbilicus.  Next, the right side was approached. Similarly, an indirect hernia was present here and the hernia sac was densely adherent to the spermatic cord structures. Using blunt dissection, the hernia sac and contents were dissected free from the spermatic cord and reduced back into the extraperitoneal space.  A piece of 6" x 6" polypropylene mesh was brought into the field. One end was cut off at. A partial longitudinal slit was cut in it. He was then placed in the right extraperitoneal space and deployed to the 2 tails of the mesh were wrapped around the spermatic cord. The mesh was then anchored to Cooper's ligament, the anterior abdominal wall, and the lateral abdominal wall with spiral tacks. This provided for good coverage and adequate overlap of the direct, indirect, and femoral spaces.  Likewise, a similar piece of mesh cut to the same specifications was placed in the left extra peritoneal space and deployed so the 2 tails were wrapped around the spermatic cord. Mesh was then anchored to Cooper's ligament, the anterior abdominal wall, and lateral abdominal wall with spiral tacks. This provided for good coverage with adequate overlap of the direct, indirect, and femoral spaces.  Following this, the extraperitoneal space was carefully inspected and there is no evidence of organ injury or bleeding. The CO2 gas was released and I watched the extraperitoneal contents approximate the mesh. All trocars were then removed.  Right anterior rectus sheath defect was closed with interrupted 0 Vicryl sutures. The skin incisions were closed with 4-0  Monocryl subcuticular stitches. Steri-Strips and sterile dressings were applied.  He tolerated the procedure well without any apparent complications and was  taken to the recovery room in satisfactory condition.    Findings: Bilateral indirect hernias  Estimated Blood Loss:  100 ml

## 2015-08-24 NOTE — Discharge Instructions (Signed)
[facility] _______[facility]  UMBILICAL OR INGUINAL HERNIA REPAIR: POST OP INSTRUCTIONS  Always review your discharge instruction sheet given to you by the facility where your surgery was performed. IF YOU HAVE DISABILITY OR FAMILY LEAVE FORMS, YOU MUST BRING THEM TO THE OFFICE FOR PROCESSING.   DO NOT GIVE THEM TO YOUR DOCTOR.  1. A  prescription for pain medication may be given to you upon discharge.  Take your pain medication as prescribed, if needed.  If narcotic pain medicine is not needed, then you may take acetaminophen (Tylenol) or ibuprofen (Advil) as needed. 2. Take your usually prescribed medications unless otherwise directed. 3. If you need a refill on your pain medication, please contact your pharmacy.  They will contact our office to request authorization. Prescriptions will not be filled after 5 pm or on week-ends. 4. You should follow a light diet the first 24 hours after arrival home, such as soup and crackers, etc.  Be sure to include lots of fluids daily.  Resume your normal diet the day after surgery. 5. Most patients will experience some swelling and bruising around the umbilicus or in the groin and scrotum.  Ice packs and reclining will help.  Swelling and bruising can take several days to resolve.  6. It is common to experience some constipation if taking pain medication after surgery.  Increasing fluid intake and taking a stool softener (such as Colace) will usually help or prevent this problem from occurring.  A mild laxative (Milk of Magnesia or Miralax) should be taken according to package directions if there are no bowel movements after 48 hours. 7. Unless discharge instructions indicate otherwise, you may remove your bandages 72 hours after surgery. You may shower the day after surgery if your are steady on your feet.  You may have steri-strips (small skin tapes) in place directly over the incision.  These strips should be left on the skin until they fall off.   If your surgeon used skin glue on the incision, you may shower in 24 hours.  The glue will flake off over the next 2-3 weeks.  Any sutures or staples will be removed at the office during your follow-up visit. 8. ACTIVITIES:  You may resume regular (light) daily activities beginning the next day--such as daily self-care, walking, climbing stairs--gradually increasing activities as tolerated.  You may have sexual intercourse when it is comfortable.  Refrain from any heavy lifting or straining for 6 weeks-nothing over 10 pounds. a. You may drive when you are no longer taking prescription pain medication, you can comfortably wear a seatbelt, and you can safely maneuver your car and apply brakes. b. RETURN TO WORK:  __________________________________________________________ 9. You should see your doctor in the office for a follow-up appointment approximately 2-3 weeks after your surgery.  Make sure that you call for this appointment within a day or two after you arrive home to insure a convenient appointment time. 10. OTHER INSTRUCTIONS:  __________________________________________________________________________________________________________________________________________________________________________________________  WHEN TO CALL YOUR DOCTOR: 1. Fever over 101.0 2. Inability to urinate 3. Nausea and/or vomiting 4. Extreme swelling or bruising 5. Continued bleeding from incision. 6. Increased pain, redness, or drainage from the incision  The clinic staff is available to answer your questions during regular business hours.  Please dont hesitate to call and ask to speak to one of the nurses for clinical concerns.  If you have a medical emergency, go to the nearest emergency room or call 911.  A surgeon from [facility]  is always on call at the hospital   [address] [place], [place], [place]  [address] ?  [address], [place], [place]   [address] [phone] ? [phone]  ? FAX [phone] Web site: [email or web address]

## 2015-08-24 NOTE — H&P (Signed)
Jake Barnett  DOB: May 22, 1941 Married / Language: Jake Barnett / Race: White Male  History of Present Illness   The patient is a 75 year old male.   Note:Mr. Everage presents today for laparoscopic repair of his bilateral inguinal hernias containing fat. Right side is symptomatic. He has been seen by his cardiologist, Dr. Stanford Breed, preoperatively and felt to be ready for surgery.  Allergies  Indomethacin *ANALGESICS - ANTI-INFLAMMATORY*   Prior to Admission medications   Medication Sig Start Date End Date Taking? Authorizing Provider  Cholecalciferol (VITAMIN D3) 5000 UNITS TABS Take 5,000 Units by mouth daily.    Yes Historical Provider, MD  Docusate Calcium (STOOL SOFTENER PO) Take 1 tablet by mouth daily.   Yes Historical Provider, MD  omeprazole (PRILOSEC) 20 MG capsule Take 20 mg by mouth 2 (two) times daily before a meal.   Yes Historical Provider, MD    Physical Exam Jake Hollingshead MD; 07/04/2015 11:26 AM)  The physical exam findings are as follows: Note:General: WDWN in NAD. Pleasant and cooperative.  CV: RRR.  CHEST: Breath sounds equal and clear. Respirations nonlabored.  ABDOMEN: Soft, nontender, no hernias.  GU: Moderate size reducible right inguinal bulge, smaller reducible left inguinal bulge.  SKIN: No jaundice.  NEUROLOGIC: Alert and oriented, answers questions appropriately, normal gait and station.  PSYCHIATRIC: Normal mood, affect , and behavior.    Assessment & Plan   BILATERAL INGUINAL HERNIA WITH OBSTRUCTION AND WITHOUT GANGRENE, RECURRENCE NOT SPECIFIED (K40.00) Impression: Right side symptomatic. Most of his symptoms are in the lateral right groin.  Plan:  Laparoscopic bilateral inguinal hernia repair with mesh. I have explained the procedure, risks, and aftercare of inguinal hernia repair. Risks include but are not limited to bleeding, infection, wound problems, anesthesia, recurrence, bladder or intestine injury, urinary retention,  testicular dysfunction, chronic pain, mesh problems.Jackolyn Confer, MD

## 2015-10-31 DIAGNOSIS — L821 Other seborrheic keratosis: Secondary | ICD-10-CM | POA: Diagnosis not present

## 2015-10-31 DIAGNOSIS — D485 Neoplasm of uncertain behavior of skin: Secondary | ICD-10-CM | POA: Diagnosis not present

## 2015-10-31 DIAGNOSIS — L853 Xerosis cutis: Secondary | ICD-10-CM | POA: Diagnosis not present

## 2015-10-31 DIAGNOSIS — L570 Actinic keratosis: Secondary | ICD-10-CM | POA: Diagnosis not present

## 2015-10-31 DIAGNOSIS — C4441 Basal cell carcinoma of skin of scalp and neck: Secondary | ICD-10-CM | POA: Diagnosis not present

## 2015-11-20 ENCOUNTER — Other Ambulatory Visit: Payer: Self-pay | Admitting: General Surgery

## 2015-11-20 DIAGNOSIS — Z8719 Personal history of other diseases of the digestive system: Secondary | ICD-10-CM | POA: Diagnosis not present

## 2015-11-20 DIAGNOSIS — K8010 Calculus of gallbladder with chronic cholecystitis without obstruction: Secondary | ICD-10-CM | POA: Diagnosis not present

## 2015-11-20 DIAGNOSIS — K219 Gastro-esophageal reflux disease without esophagitis: Secondary | ICD-10-CM | POA: Diagnosis not present

## 2015-11-20 DIAGNOSIS — N200 Calculus of kidney: Secondary | ICD-10-CM | POA: Diagnosis not present

## 2015-11-20 DIAGNOSIS — I4510 Unspecified right bundle-branch block: Secondary | ICD-10-CM | POA: Diagnosis not present

## 2015-11-20 DIAGNOSIS — Z9889 Other specified postprocedural states: Secondary | ICD-10-CM | POA: Diagnosis not present

## 2015-11-20 DIAGNOSIS — Z96652 Presence of left artificial knee joint: Secondary | ICD-10-CM | POA: Diagnosis not present

## 2015-11-28 NOTE — Patient Instructions (Signed)
Jake Barnett  11/28/2015   Your procedure is scheduled on: 12/04/2015    Report to [facility]  Entrance take [facility]  elevators to 3rd floor to  [place] at     0700 AM.  Call this number if you have problems the morning of surgery [phone]   Remember: ONLY 1 PERSON MAY GO WITH YOU TO SHORT STAY TO GET  READY MORNING OF Jake Barnett.  Do not eat food or drink liquids :After Midnight.     Take these medicines the morning of surgery with A SIP OF WATER: Prilosec, Oxycodone if needed                                 You may not have any metal on your body including hair pins and              piercings  Do not wear jewelry,  lotions, powders or perfumes, deodorant                  Men may shave face and neck.   Do not bring valuables to the hospital. Jake Barnett.  Contacts, dentures or bridgework may not be worn into surgery.      Patients discharged the day of surgery will not be allowed to drive home.  Name and phone number of your driver:  Special Instructions: coughing and deep breathing exercises, leg exercises               Please read over the following fact sheets you were given: _____________________________________________________________________             [facility] - Preparing for Surgery Before surgery, you can play an important role.  Because skin is not sterile, your skin needs to be as free of germs as possible.  You can reduce the number of germs on your skin by washing with CHG (chlorahexidine gluconate) soap before surgery.  CHG is an antiseptic cleaner which kills germs and bonds with the skin to continue killing germs even after washing. Please DO NOT use if you have an allergy to CHG or antibacterial soaps.  If your skin becomes reddened/irritated stop using the CHG and inform your nurse when you arrive at Short Stay. Do not shave (including legs and underarms) for at least  48 hours prior to the first CHG shower.  You may shave your face/neck. Please follow these instructions carefully:  1.  Shower with CHG Soap the night before surgery and the  morning of Surgery.  2.  If you choose to wash your hair, wash your hair first as usual with your  normal  shampoo.  3.  After you shampoo, rinse your hair and body thoroughly to remove the  shampoo.                           4.  Use CHG as you would any other liquid soap.  You can apply chg directly  to the skin and wash                       Gently with a scrungie or clean washcloth.  5.  Apply the CHG Soap to your body ONLY FROM THE NECK DOWN.   Do not use on face/ open                           Wound or open sores. Avoid contact with eyes, ears mouth and genitals (private parts).                       Wash face,  Genitals (private parts) with your normal soap.             6.  Wash thoroughly, paying special attention to the area where your surgery  will be performed.  7.  Thoroughly rinse your body with warm water from the neck down.  8.  DO NOT shower/wash with your normal soap after using and rinsing off  the CHG Soap.                9.  Pat yourself dry with a clean towel.            10.  Wear clean pajamas.            11.  Place clean sheets on your bed the night of your first shower and do not  sleep with pets. Day of Surgery : Do not apply any lotions/deodorants the morning of surgery.  Please wear clean clothes to the hospital/surgery center.  FAILURE TO FOLLOW THESE INSTRUCTIONS MAY RESULT IN THE CANCELLATION OF YOUR SURGERY PATIENT SIGNATURE_________________________________  NURSE SIGNATURE__________________________________  ________________________________________________________________________

## 2015-11-29 ENCOUNTER — Encounter (HOSPITAL_COMMUNITY): Payer: Self-pay

## 2015-11-29 ENCOUNTER — Encounter (HOSPITAL_COMMUNITY)
Admission: RE | Admit: 2015-11-29 | Discharge: 2015-11-29 | Disposition: A | Discharge status: Home / Self Care | Payer: Medicare Other | Source: Ambulatory Visit | Attending: General Surgery | Admitting: General Surgery

## 2015-11-29 DIAGNOSIS — K219 Gastro-esophageal reflux disease without esophagitis: Secondary | ICD-10-CM | POA: Insufficient documentation

## 2015-11-29 DIAGNOSIS — E785 Hyperlipidemia, unspecified: Secondary | ICD-10-CM | POA: Diagnosis not present

## 2015-11-29 DIAGNOSIS — Z87442 Personal history of urinary calculi: Secondary | ICD-10-CM | POA: Insufficient documentation

## 2015-11-29 DIAGNOSIS — Z01812 Encounter for preprocedural laboratory examination: Secondary | ICD-10-CM | POA: Insufficient documentation

## 2015-11-29 DIAGNOSIS — N189 Chronic kidney disease, unspecified: Secondary | ICD-10-CM | POA: Insufficient documentation

## 2015-11-29 LAB — CBC WITH DIFFERENTIAL/PLATELET
Basophils Absolute: 0 10*3/uL (ref 0.0–0.1)
Basophils Relative: 0 %
EOS ABS: 0.2 10*3/uL (ref 0.0–0.7)
EOS PCT: 2 %
HCT: 43.5 % (ref 39.0–52.0)
HEMOGLOBIN: 14.6 g/dL (ref 13.0–17.0)
LYMPHS ABS: 1.9 10*3/uL (ref 0.7–4.0)
Lymphocytes Relative: 24 %
MCH: 28.5 pg (ref 26.0–34.0)
MCHC: 33.6 g/dL (ref 30.0–36.0)
MCV: 84.8 fL (ref 78.0–100.0)
MONOS PCT: 8 %
Monocytes Absolute: 0.7 10*3/uL (ref 0.1–1.0)
NEUTROS PCT: 66 %
Neutro Abs: 5.2 10*3/uL (ref 1.7–7.7)
Platelets: 221 10*3/uL (ref 150–400)
RBC: 5.13 MIL/uL (ref 4.22–5.81)
RDW: 14 % (ref 11.5–15.5)
WBC: 8 10*3/uL (ref 4.0–10.5)

## 2015-11-29 LAB — COMPREHENSIVE METABOLIC PANEL
ALK PHOS: 76 U/L (ref 38–126)
ALT: 10 U/L — AB (ref 17–63)
AST: 18 U/L (ref 15–41)
Albumin: 3.9 g/dL (ref 3.5–5.0)
Anion gap: 10 (ref 5–15)
BUN: 16 mg/dL (ref 6–20)
CALCIUM: 8.7 mg/dL — AB (ref 8.9–10.3)
CHLORIDE: 103 mmol/L (ref 101–111)
CO2: 27 mmol/L (ref 22–32)
CREATININE: 1.08 mg/dL (ref 0.61–1.24)
GFR calc Af Amer: >60 mL/min (ref >60)
GFR calc non Af Amer: >60 mL/min (ref >60)
GLUCOSE: 98 mg/dL (ref 65–99)
Potassium: 3.8 mmol/L (ref 3.5–5.1)
SODIUM: 140 mmol/L (ref 135–145)
Total Bilirubin: 1.3 mg/dL — ABNORMAL HIGH (ref 0.3–1.2)
Total Protein: 6.4 g/dL — ABNORMAL LOW (ref 6.5–8.1)

## 2015-11-29 NOTE — Progress Notes (Signed)
EKG- 07/10/15- EPIC  07/10/15- LOV- Cardiology- EPIC

## 2015-12-03 NOTE — H&P (Signed)
Jake Barnett  Location: [facility] Patient #: [id number] DOB: 1941/03/27 Married / Language: English / Race: White Male       History of Present Illness The patient is a 75 year old male who presents for evaluation of gall stones. This is a very pleasant 75 year old man who returns to see me with symptomatic gallstones. His wife is a breast cancer patient of mine.  I have seen him last year about gallbladder disease but he was having more symptoms from his inguinal hernia. Because I was going out on medical leave Dr. Zella Richer took him to the operating room on August 24, 2015 and he underwent laparoscopic bilateral inguinal hernia repair with mesh. He has recovered from that. He had a lot of bleeding and bruising and still has a little bit of testicular pain but thinks the hernia repairs are intact and he is happy with his result. He saw Kirk Ruths preoperative cardiac clearance for that surgery and he cleared him for surgery saying he just had a right bundle branch block and some occasional PACs and PVCs. He's had stress test and echocardiogram and all that looked fine. He doesn't have any history of cardiac disease. No angina symptoms. No shortness of breath. Can easily walk up 2 flights of steps. I don't think he needs repeat cardiac clearance.  In terms of his gallbladder his ultrasound and CT scan do show gallstones. Liver function tests have been normal. Hepatobiliary scan shows reduced ejection fraction is 60% but no symptoms with CCK.  He does have problems with belching and bloating for many years. He states that almost daily now he'll have postprandial bloating belching and if he eats a heavy meal or a fatty meal he will have a lot of pressure in the epigastrium. No nausea or vomiting. No diarrhea. He's been taking Prilosec twice a day for a long time but this doesn't seem to affect his symptoms at all.  I told him that it sounded like he is  becoming more symptomatic with his gallbladder and offered elective cholecystectomy. He is strongly in favor of going ahead with that, hoping that he can avoid a complication. This seems reasonable considering his workup and symptoms.  He will be scheduled for laparoscopic cholecystectomy with cholangiogram, possible open cholecystectomy in the near future. He wants to do this at [place] [facility]. I discussed the indications, details, techniques, and numerous risk of the surgery with him. He is aware the risk of bleeding, infection, injury to adjacent organs with major reconstructive surgery, conversion to open laparotomy, postop bile leak with readmission and further testing, wound hernia, pancreatitis, and other unforeseen problems. He understands these issues well. At this time of his questions are answered. He agrees with this plan.   Allergies  Indomethacin *ANALGESICS - ANTI-INFLAMMATORY*  Medication History  Stool Softener (240MG  Capsule, Oral) Active. Vitamin D (2000UNIT Capsule, Oral) Active. Ibuprofen (200MG  Tablet, Oral) Active. Omeprazole (20MG  Capsule DR, Oral) Active. Medications Reconciled  Vitals  Weight: 212 lb Height: 71in Body Surface Area: 2.16 m Body Mass Index: 29.57 kg/m  Temp.: 97.65F(Temporal)  Pulse: 78 (Regular)  BP: 140/82 (Sitting, Left Arm, Standard)       Physical Exam General Mental Status-Alert. General Appearance-Not in acute distress. Build & Nutrition-Well nourished. Posture-Normal posture. Gait-Normal.  Head and Neck Head-normocephalic, atraumatic with no lesions or palpable masses. Trachea-midline. Thyroid Gland Characteristics - normal size and consistency and no palpable nodules.  Chest and Lung Exam Chest and lung exam reveals -on auscultation,  normal breath sounds, no adventitious sounds and normal vocal resonance.  Cardiovascular Cardiovascular examination reveals -normal heart sounds,  regular rate and rhythm with no murmurs and femoral artery auscultation bilaterally reveals normal pulses, no bruits, no thrills.  Abdomen Inspection Inspection of the abdomen reveals - No Hernias. Palpation/Percussion Palpation and Percussion of the abdomen reveal - Soft, Non Tender, No Rigidity (guarding), No hepatosplenomegaly and No Palpable abdominal masses. Note: Well-healed transverse incision below umbilicus and trocar sites in the lower midline. Abdomen nontender. Liver and spleen not enlarged. Right upper quadrant nontender. Inguinal hernia repairs appeared intact. Nontender in the groins.   Neurologic Neurologic evaluation reveals -alert and oriented x 3 with no impairment of recent or remote memory, normal attention span and ability to concentrate, normal sensation and normal coordination.  Musculoskeletal Normal Exam - Bilateral-Upper Extremity Strength Normal and Lower Extremity Strength Normal.    Assessment & Plan  CHRONIC CHOLECYSTITIS WITH CALCULUS (K80.10)    You seem to be recovering from your inguinal hernia repairs without any problems.  Your symptoms of fatty food intolerance, bloating, indigestion, and upper abdominal pressure do sound like gallbladder attacks. I do not think you have had any complications You do have gallstones and so you clearly have a diseased gallbladder Your hepatobiliary scan was abnormal, and this also suggested gallbladder disease I have offered elective cholecystectomy, and you state that you would like to go ahead and do that  You'll be scheduled for laparoscopic cholecystectomy with cholangiogram, possible open cholecystectomy in the near future We have discussed the indications, techniques, and numerous risk of the surgery Please review the information booklet that I gave the  CHRONIC GERD (K21.9) S/P BILATERAL INGUINAL HERNIORRHAPHY (Z98.890) KIDNEY STONE ON LEFT SIDE (N20.0) RBBB (I45.10) HISTORY OF KNEE REPLACEMENT,  TOTAL, LEFT ED:[id number])   Edsel Petrin. Dalbert Batman, M.D., [facility] General and Minimally invasive Surgery Breast and Colorectal Surgery Office:   [phone] Pager:   [phone]

## 2015-12-04 ENCOUNTER — Ambulatory Visit (HOSPITAL_COMMUNITY)
Admission: RE | Admit: 2015-12-04 | Discharge: 2015-12-04 | Disposition: A | Discharge status: Home / Self Care | Payer: Medicare Other | Source: Ambulatory Visit | Attending: General Surgery | Admitting: General Surgery

## 2015-12-04 ENCOUNTER — Encounter (HOSPITAL_COMMUNITY): Payer: Self-pay | Admitting: *Deleted

## 2015-12-04 ENCOUNTER — Encounter (HOSPITAL_COMMUNITY)
Admission: RE | Disposition: A | Discharge status: Home / Self Care | Payer: Self-pay | Source: Ambulatory Visit | Attending: General Surgery

## 2015-12-04 ENCOUNTER — Ambulatory Visit (HOSPITAL_COMMUNITY): Payer: Medicare Other | Admitting: Anesthesiology

## 2015-12-04 DIAGNOSIS — K8010 Calculus of gallbladder with chronic cholecystitis without obstruction: Secondary | ICD-10-CM | POA: Diagnosis not present

## 2015-12-04 DIAGNOSIS — K219 Gastro-esophageal reflux disease without esophagitis: Secondary | ICD-10-CM | POA: Insufficient documentation

## 2015-12-04 DIAGNOSIS — I10 Essential (primary) hypertension: Secondary | ICD-10-CM | POA: Diagnosis not present

## 2015-12-04 DIAGNOSIS — D649 Anemia, unspecified: Secondary | ICD-10-CM | POA: Insufficient documentation

## 2015-12-04 DIAGNOSIS — K8000 Calculus of gallbladder with acute cholecystitis without obstruction: Secondary | ICD-10-CM | POA: Diagnosis not present

## 2015-12-04 DIAGNOSIS — I4510 Unspecified right bundle-branch block: Secondary | ICD-10-CM | POA: Insufficient documentation

## 2015-12-04 DIAGNOSIS — Z79899 Other long term (current) drug therapy: Secondary | ICD-10-CM | POA: Insufficient documentation

## 2015-12-04 DIAGNOSIS — K8020 Calculus of gallbladder without cholecystitis without obstruction: Secondary | ICD-10-CM | POA: Diagnosis not present

## 2015-12-04 HISTORY — DX: Calculus of gallbladder with chronic cholecystitis without obstruction: K80.10

## 2015-12-04 HISTORY — PX: CHOLECYSTECTOMY: SHX55

## 2015-12-04 SURGERY — LAPAROSCOPIC CHOLECYSTECTOMY WITH INTRAOPERATIVE CHOLANGIOGRAM
Anesthesia: General | Site: Abdomen

## 2015-12-04 MED ORDER — ONDANSETRON HCL 4 MG/2ML IJ SOLN
INTRAMUSCULAR | Status: AC
  Filled 2015-12-04: qty 2

## 2015-12-04 MED ORDER — SODIUM CHLORIDE 0.9% FLUSH: 3.0000 mL | INTRAVENOUS | Status: DC | PRN

## 2015-12-04 MED ORDER — SUCCINYLCHOLINE CHLORIDE 20 MG/ML IJ SOLN
INTRAMUSCULAR | Status: DC | PRN
  Administered 2015-12-04: 100 mg via INTRAVENOUS

## 2015-12-04 MED ORDER — FENTANYL CITRATE (PF) 100 MCG/2ML IJ SOLN: 25.0000 ug | INTRAMUSCULAR | Status: DC | PRN

## 2015-12-04 MED ORDER — BUPIVACAINE-EPINEPHRINE 0.5% -1:200000 IJ SOLN
INTRAMUSCULAR | Status: DC | PRN
  Administered 2015-12-04: 15 mL

## 2015-12-04 MED ORDER — PROPOFOL 10 MG/ML IV BOLUS
INTRAVENOUS | Status: DC | PRN
  Administered 2015-12-04: 150 mg via INTRAVENOUS

## 2015-12-04 MED ORDER — OXYCODONE HCL 5 MG PO TABS
5.0000 mg | ORAL_TABLET | ORAL | Status: DC | PRN
  Administered 2015-12-04: 5 mg via ORAL
  Filled 2015-12-04: qty 1

## 2015-12-04 MED ORDER — 0.9 % SODIUM CHLORIDE (POUR BTL) OPTIME
TOPICAL | Status: DC | PRN
Start: 2015-12-04 — End: 2015-12-04
  Administered 2015-12-04: 1000 mL

## 2015-12-04 MED ORDER — ROCURONIUM BROMIDE 100 MG/10ML IV SOLN
INTRAVENOUS | Status: DC | PRN
  Administered 2015-12-04: 40 mg via INTRAVENOUS
  Administered 2015-12-04: 5 mg via INTRAVENOUS

## 2015-12-04 MED ORDER — CHLORHEXIDINE GLUCONATE 4 % EX LIQD
1.0000 | Freq: Once | CUTANEOUS | Status: DC
Start: 2015-12-05 — End: 2015-12-04

## 2015-12-04 MED ORDER — CEFAZOLIN SODIUM-DEXTROSE 2-4 GM/100ML-% IV SOLN
2.0000 g | INTRAVENOUS | Status: AC
  Administered 2015-12-04: 2 g via INTRAVENOUS
  Filled 2015-12-04: qty 100

## 2015-12-04 MED ORDER — LACTATED RINGERS IR SOLN
Status: DC | PRN
  Administered 2015-12-04: 1000 mL

## 2015-12-04 MED ORDER — DEXAMETHASONE SODIUM PHOSPHATE 10 MG/ML IJ SOLN
INTRAMUSCULAR | Status: AC
  Filled 2015-12-04: qty 1

## 2015-12-04 MED ORDER — FENTANYL CITRATE (PF) 100 MCG/2ML IJ SOLN
25.0000 ug | INTRAMUSCULAR | Status: DC | PRN
  Administered 2015-12-04 (×2): 50 ug via INTRAVENOUS

## 2015-12-04 MED ORDER — LIDOCAINE HCL (CARDIAC) 20 MG/ML IV SOLN
INTRAVENOUS | Status: AC
  Filled 2015-12-04: qty 5

## 2015-12-04 MED ORDER — SUGAMMADEX SODIUM 200 MG/2ML IV SOLN
INTRAVENOUS | Status: DC | PRN
  Administered 2015-12-04: 200 mg via INTRAVENOUS

## 2015-12-04 MED ORDER — ACETAMINOPHEN 325 MG PO TABS: 650.0000 mg | ORAL_TABLET | ORAL | Status: DC | PRN

## 2015-12-04 MED ORDER — FENTANYL CITRATE (PF) 100 MCG/2ML IJ SOLN
INTRAMUSCULAR | Status: AC
  Filled 2015-12-04: qty 2

## 2015-12-04 MED ORDER — SUGAMMADEX SODIUM 200 MG/2ML IV SOLN
INTRAVENOUS | Status: AC
  Filled 2015-12-04: qty 2

## 2015-12-04 MED ORDER — FENTANYL CITRATE (PF) 100 MCG/2ML IJ SOLN
INTRAMUSCULAR | Status: DC | PRN
  Administered 2015-12-04 (×2): 50 ug via INTRAVENOUS

## 2015-12-04 MED ORDER — IOPAMIDOL (ISOVUE-300) INJECTION 61%
INTRAVENOUS | Status: AC
  Filled 2015-12-04: qty 50

## 2015-12-04 MED ORDER — CHLORHEXIDINE GLUCONATE 4 % EX LIQD: 1.0000 "application " | Freq: Once | CUTANEOUS | Status: DC

## 2015-12-04 MED ORDER — FENTANYL CITRATE (PF) 100 MCG/2ML IJ SOLN
INTRAMUSCULAR | Status: DC
Start: 2015-12-04 — End: 2015-12-04
  Filled 2015-12-04: qty 2

## 2015-12-04 MED ORDER — LIDOCAINE HCL (CARDIAC) 20 MG/ML IV SOLN
INTRAVENOUS | Status: DC | PRN
  Administered 2015-12-04: 80 mg via INTRATRACHEAL

## 2015-12-04 MED ORDER — HYDROCODONE-ACETAMINOPHEN 5-325 MG PO TABS: 1.0000 | ORAL_TABLET | Freq: Four times a day (QID) | ORAL | Status: DC | PRN

## 2015-12-04 MED ORDER — LACTATED RINGERS IV SOLN
INTRAVENOUS | Status: DC
Start: 2015-12-04 — End: 2015-12-04
  Administered 2015-12-04: 1000 mL via INTRAVENOUS
  Administered 2015-12-04: 10:00:00 via INTRAVENOUS

## 2015-12-04 MED ORDER — CEFAZOLIN SODIUM-DEXTROSE 2-4 GM/100ML-% IV SOLN
INTRAVENOUS | Status: AC
  Filled 2015-12-04: qty 100

## 2015-12-04 MED ORDER — PROPOFOL 10 MG/ML IV BOLUS
INTRAVENOUS | Status: AC
  Filled 2015-12-04: qty 20

## 2015-12-04 MED ORDER — BUPIVACAINE-EPINEPHRINE 0.5% -1:200000 IJ SOLN
INTRAMUSCULAR | Status: AC
  Filled 2015-12-04: qty 1

## 2015-12-04 MED ORDER — EPHEDRINE SULFATE 50 MG/ML IJ SOLN
INTRAMUSCULAR | Status: DC | PRN
  Administered 2015-12-04: 10 mg via INTRAVENOUS

## 2015-12-04 MED ORDER — ACETAMINOPHEN 650 MG RE SUPP
650.0000 mg | RECTAL | Status: DC | PRN
  Filled 2015-12-04: qty 1

## 2015-12-04 MED ORDER — SODIUM CHLORIDE 0.9% FLUSH: 3.0000 mL | Freq: Two times a day (BID) | INTRAVENOUS | Status: DC

## 2015-12-04 MED ORDER — ONDANSETRON HCL 4 MG/2ML IJ SOLN
INTRAMUSCULAR | Status: DC | PRN
  Administered 2015-12-04: 4 mg via INTRAVENOUS

## 2015-12-04 MED ORDER — SODIUM CHLORIDE 0.9 % IV SOLN: 250.0000 mL | INTRAVENOUS | Status: DC | PRN

## 2015-12-04 MED ORDER — DEXAMETHASONE SODIUM PHOSPHATE 10 MG/ML IJ SOLN
INTRAMUSCULAR | Status: DC | PRN
  Administered 2015-12-04: 10 mg via INTRAVENOUS

## 2015-12-04 MED ORDER — SODIUM CHLORIDE 0.9 % IV SOLN: INTRAVENOUS | Status: DC

## 2015-12-04 SURGICAL SUPPLY — 36 items
ADH SKN CLS APL DERMABOND .7 (GAUZE/BANDAGES/DRESSINGS) ×1
APPLIER CLIP ROT 10 11.4 M/L (STAPLE) ×3
APR CLP MED LRG 11.4X10 (STAPLE) ×1
BAG SPEC RTRVL LRG 6X4 10 (ENDOMECHANICALS) ×1
CABLE HIGH FREQUENCY MONO STRZ (ELECTRODE) ×3 IMPLANT
CLIP APPLIE ROT 10 11.4 M/L (STAPLE) ×1 IMPLANT
COVER MAYO STAND STRL (DRAPES) ×3 IMPLANT
COVER SURGICAL LIGHT HANDLE (MISCELLANEOUS) ×3 IMPLANT
DECANTER SPIKE VIAL GLASS SM (MISCELLANEOUS) ×3 IMPLANT
DERMABOND ADVANCED (GAUZE/BANDAGES/DRESSINGS) ×2
DERMABOND ADVANCED .7 DNX12 (GAUZE/BANDAGES/DRESSINGS) ×1 IMPLANT
DRAPE C-ARM 42X120 X-RAY (DRAPES) ×3 IMPLANT
DRAPE LAPAROSCOPIC ABDOMINAL (DRAPES) ×1 IMPLANT
ELECT REM PT RETURN 9FT ADLT (ELECTROSURGICAL) ×3
ELECTRODE REM PT RTRN 9FT ADLT (ELECTROSURGICAL) ×1 IMPLANT
GLOVE EUDERMIC 7 POWDERFREE (GLOVE) ×3 IMPLANT
GOWN STRL REUS W/TWL XL LVL3 (GOWN DISPOSABLE) ×12 IMPLANT
HEMOSTAT SNOW SURGICEL 2X4 (HEMOSTASIS) ×2 IMPLANT
KIT BASIN OR (CUSTOM PROCEDURE TRAY) ×3 IMPLANT
LIQUID BAND (GAUZE/BANDAGES/DRESSINGS) ×4 IMPLANT
POSITIONER SURGICAL ARM (MISCELLANEOUS) IMPLANT
POUCH SPECIMEN RETRIEVAL 10MM (ENDOMECHANICALS) ×3 IMPLANT
SCISSORS LAP 5X35 DISP (ENDOMECHANICALS) ×3 IMPLANT
SET CHOLANGIOGRAPH MIX (MISCELLANEOUS) ×3 IMPLANT
SET IRRIG TUBING LAPAROSCOPIC (IRRIGATION / IRRIGATOR) ×3 IMPLANT
SLEEVE XCEL OPT CAN 5 100 (ENDOMECHANICALS) ×3 IMPLANT
SUT MNCRL AB 4-0 PS2 18 (SUTURE) ×5 IMPLANT
SUT VICRYL 0 UR6 27IN ABS (SUTURE) ×2 IMPLANT
TAPE CLOTH 4X10 WHT NS (GAUZE/BANDAGES/DRESSINGS) ×2 IMPLANT
TOWEL OR 17X26 10 PK STRL BLUE (TOWEL DISPOSABLE) ×3 IMPLANT
TOWEL OR NON WOVEN STRL DISP B (DISPOSABLE) ×3 IMPLANT
TRAY LAPAROSCOPIC (CUSTOM PROCEDURE TRAY) ×3 IMPLANT
TROCAR BLADELESS OPT 5 100 (ENDOMECHANICALS) ×3 IMPLANT
TROCAR XCEL BLUNT TIP 100MML (ENDOMECHANICALS) ×3 IMPLANT
TROCAR XCEL NON-BLD 11X100MML (ENDOMECHANICALS) ×3 IMPLANT
TUBING INSUF HEATED (TUBING) ×3 IMPLANT

## 2015-12-04 NOTE — Anesthesia Postprocedure Evaluation (Signed)
Anesthesia Post Note  Patient: Jake Barnett  Procedure(s) Performed: Procedure(s) (LRB): LAPAROSCOPIC CHOLECYSTECTOMY (N/A)  Patient location during evaluation: PACU Anesthesia Type: General Level of consciousness: awake and alert Pain management: pain level controlled Vital Signs Assessment: post-procedure vital signs reviewed and stable Respiratory status: spontaneous breathing, nonlabored ventilation, respiratory function stable and patient connected to nasal cannula oxygen Cardiovascular status: blood pressure returned to baseline and stable Postop Assessment: no signs of nausea or vomiting Anesthetic complications: no    Last Vitals:  Filed Vitals:   12/04/15 1106 12/04/15 1312  BP: 138/79 144/83  Pulse: 72 83  Temp: 36.5 C 36.7 C  Resp:  18    Last Pain:  Filed Vitals:   12/04/15 1314  PainSc: 2                  Khyri Hinzman L

## 2015-12-04 NOTE — Anesthesia Preprocedure Evaluation (Addendum)
Anesthesia Evaluation  Patient identified by MRN, date of birth, ID band Patient awake    Reviewed: Allergy & Precautions, NPO status , Patient's Chart, lab work & pertinent test results  History of Anesthesia Complications (+) PONV and history of anesthetic complications  Airway Mallampati: II  TM Distance: <3 FB Neck ROM: Limited    Dental  (+) Teeth Intact, Dental Advisory Given   Pulmonary shortness of breath,    breath sounds clear to auscultation       Cardiovascular hypertension, + dysrhythmias  Rhythm:Regular Rate:Normal  Mild diastolic dysfunction.  RBBB   Neuro/Psych Cervical fusion negative neurological ROS  negative psych ROS   GI/Hepatic Neg liver ROS, hiatal hernia, GERD  Medicated,  Endo/Other  negative endocrine ROS  Renal/GU   negative genitourinary   Musculoskeletal  (+) Arthritis , Osteoarthritis,    Abdominal   Peds negative pediatric ROS (+)  Hematology  (+) anemia ,   Anesthesia Other Findings   Reproductive/Obstetrics negative OB ROS                            Anesthesia Physical Anesthesia Plan  ASA: III  Anesthesia Plan: General   Post-op Pain Management:    Induction: Intravenous  Airway Management Planned: Oral ETT  Additional Equipment:   Intra-op Plan:   Post-operative Plan: Extubation in OR  Informed Consent:   Plan Discussed with: Surgeon  Anesthesia Plan Comments:         Anesthesia Quick Evaluation

## 2015-12-04 NOTE — Interval H&P Note (Signed)
History and Physical Interval Note:  12/04/2015 7:30 AM  Jake Barnett  has presented today for surgery, with the diagnosis of Gallstones   The various methods of treatment have been discussed with the patient and family. After consideration of risks, benefits and other options for treatment, the patient has consented to  Procedure(s): LAPAROSCOPIC CHOLECYSTECTOMY WITH INTRAOPERATIVE CHOLANGIOGRAM POSSIBLE OPEN  (N/A) as a surgical intervention .  The patient's history has been reviewed, patient examined, no change in status, stable for surgery.  I have reviewed the patient's chart and labs.  Questions were answered to the patient's satisfaction.     [name]

## 2015-12-04 NOTE — Op Note (Signed)
Patient Name:           Jake Barnett   Date of Surgery:        12/04/2015  Pre op Diagnosis:      Chronic cholecystitis with cholelithiasis  Post op Diagnosis:    Chronic cholecystitis with cholelithiasis  Procedure:                 Laparoscopic cholecystectomy  Surgeon:                     Edsel Petrin. Dalbert Batman, M.D., FACS  Assistant:                      Servando Snare, RNFA  Operative Indications:    This is a very pleasant 75 year old man who returns to see me with symptomatic gallstones. His wife is a breast cancer patient of mine.     I have seen him last year about gallbladder disease but he was having more symptoms from his inguinal hernia. Because I was going out on medical leave Dr. Zella Richer took him to the operating room on August 24, 2015 and he underwent laparoscopic bilateral inguinal hernia repair with mesh. He has recovered from that. He had a lot of bleeding and bruising and still has a little bit of testicular pain but thinks the hernia repairs are intact and he is happy with his result. He saw Kirk Ruths preoperative cardiac clearance for that surgery and he cleared him for surgery saying he just had a right bundle branch block and some occasional PACs and PVCs. He's had stress test and echocardiogram and all that looked fine. He doesn't have any history of cardiac disease.     In terms of his gallbladder,  his ultrasound and CT scan do show gallstones. Liver function tests have been normal. Hepatobiliary scan shows reduced ejection fraction is 60% but no symptoms with CCK.      He does have problems with belching and bloating for many years. He states that almost daily now he'll have postprandial bloating belching and if he eats a heavy meal or a fatty meal he will have a lot of pressure in the epigastrium. No nausea or vomiting. No diarrhea. He's been taking Prilosec twice a day for a long time but this doesn't seem to affect his symptoms at all.      I told him  that  he is becoming more symptomatic with his gallbladder and offered elective cholecystectomy. He is strongly in favor of going ahead with that, hoping that he can avoid a complication. This seems reasonable considering his workup and symptoms.   Operative Findings:       The gallbladder was chronically inflamed, thin-walled, very friable, tore easily.  There were extensive adhesions, chronic and relatively soft, throughout the gallbladder.  The anatomy of the cystic artery and cystic duct were conventional.  The liver was large and this impaired our ability to retract the gallbladder and this slowed down the procedure but technically everything went well with good visualization.  There is no other abnormality of the intra-abdominal viscera.  Procedure in Detail:           Following the induction of general endotracheal anesthesia the patient's abdomen was prepped and draped in a sterile fashion.  Intravenous antibiotics were given.  Surgical timeout was performed.  0.5% Marcaine with epinephrine was used as local infiltration anesthetic.  An 11 mm Hassan trocar was placed in the midline  just above the umbilicus with an open technique.  Pneumoperitoneum was created and video camera was inserted.  11 mm trochar was  placed in subxiphoid region and two 5 mm trochars placed in the right upper quadrant.  We took down some adhesions over the fundus and that we could lift the fundus up.  We then slowly took all the adhesions down from the fundus downward.  We made one hole in the infundibulum and some soft sludge came out and we evacuated that without any difficulty.  We then dissected out the cystic duct and cystic artery and created a nice window behind these 2 structures.  I secured the cystic duct with 3 metal clips and divided it.  I isolated 2 branches of the cystic artery that were individually isolated as they went onto the wall of the gallbladder.  These were secured with metal clips and divided.   Gallbladder was dissected from its bed with electrocautery placed in a specimen bag and removed.  We had had a little bit of bleeding from the gallbladder bed but that completely stopped after we remove the gallbladder.  We copiously irrigated the subhepatic and subphrenic spaces using the 10 mm sucker tip.  At the end of the case we saw no stones, no bleeding and no bile leak.  I chose to put a piece of snow hemostatic sponge in bed of the gallbladder and tucked the omentum up and after 5 minutes it look perfectly clean.  The trochars were removed and the pneumoperitoneum was released.  The fascia the umbilicus was closed with interrupted sutures of 0 Vicryl in a Purstring suture of 0 Vicryl.  After irrigating the wounds the skin was closed with subcuticular 4-0 Monocryl and Dermabond.  The patient tolerated the procedure well was taken to PACU in stable condition.  EBL 25 mL.  Counts correct.  Complications none.     Edsel Petrin. Dalbert Batman, M.D., FACS General and Minimally Invasive Surgery Breast and Colorectal Surgery  12/04/2015 10:09 AM

## 2015-12-04 NOTE — Transfer of Care (Signed)
Immediate Anesthesia Transfer of Care Note  Patient: Jake Barnett  Procedure(s) Performed: Procedure(s): LAPAROSCOPIC CHOLECYSTECTOMY (N/A)  Patient Location: PACU  Anesthesia Type:General  Level of Consciousness: awake, alert , oriented and patient cooperative  Airway & Oxygen Therapy: Patient Spontanous Breathing and Patient connected to face mask oxygen  Post-op Assessment: Report given to RN and Post -op Vital signs reviewed and stable  Post vital signs: Reviewed and stable  Last Vitals:  Filed Vitals:   12/04/15 0706  BP: 165/86  Pulse: 67  Temp: 36.8 C  Resp: 16    Last Pain: There were no vitals filed for this visit.    Patients Stated Pain Goal: 4 ([id number])  Complications: No apparent anesthesia complications

## 2015-12-04 NOTE — Discharge Instructions (Signed)
[facility] ______[facility] °LAPAROSCOPIC SURGERY: POST OP INSTRUCTIONS °Always review your discharge instruction sheet given to you by the facility where your surgery was performed. °IF YOU HAVE DISABILITY OR FAMILY LEAVE FORMS, YOU MUST BRING THEM TO THE OFFICE FOR PROCESSING.   °DO NOT GIVE THEM TO YOUR DOCTOR. ° °1. A prescription for pain medication may be given to you upon discharge.  Take your pain medication as prescribed, if needed.  If narcotic pain medicine is not needed, then you may take acetaminophen (Tylenol) or ibuprofen (Advil) as needed. °2. Take your usually prescribed medications unless otherwise directed. °3. If you need a refill on your pain medication, please contact your pharmacy.  They will contact our office to request authorization. Prescriptions will not be filled after 5pm or on week-ends. °4. You should follow a light diet the first few days after arrival home, such as soup and crackers, etc.  Be sure to include lots of fluids daily. °5. Most patients will experience some swelling and bruising in the area of the incisions.  Ice packs will help.  Swelling and bruising can take several days to resolve.  °6. It is common to experience some constipation if taking pain medication after surgery.  Increasing fluid intake and taking a stool softener (such as Colace) will usually help or prevent this problem from occurring.  A mild laxative (Milk of Magnesia or Miralax) should be taken according to package instructions if there are no bowel movements after 48 hours. °7. Unless discharge instructions indicate otherwise, you may remove your bandages 24-48 hours after surgery, and you may shower at that time.  You may have steri-strips (small skin tapes) in place directly over the incision.  These strips should be left on the skin for 7-10 days.  If your surgeon used skin glue on the incision, you may shower in 24 hours.  The glue will flake off over the next 2-3 weeks.  Any sutures or  staples will be removed at the office during your follow-up visit. °8. ACTIVITIES:  You may resume regular (light) daily activities beginning the next day--such as daily self-care, walking, climbing stairs--gradually increasing activities as tolerated.  You may have sexual intercourse when it is comfortable.  Refrain from any heavy lifting or straining until approved by your doctor. °a. You may drive when you are no longer taking prescription pain medication, you can comfortably wear a seatbelt, and you can safely maneuver your car and apply brakes. °b. RETURN TO WORK:  __________________________________________________________ °9. You should see your doctor in the office for a follow-up appointment approximately 2-3 weeks after your surgery.  Make sure that you call for this appointment within a day or two after you arrive home to insure a convenient appointment time. °10. OTHER INSTRUCTIONS: __________________________________________________________________________________________________________________________ __________________________________________________________________________________________________________________________ °WHEN TO CALL YOUR DOCTOR: °1. Fever over 101.0 °2. Inability to urinate °3. Continued bleeding from incision. °4. Increased pain, redness, or drainage from the incision. °5. Increasing abdominal pain ° °The clinic staff is available to answer your questions during regular business hours.  Please don’t hesitate to call and ask to speak to one of the nurses for clinical concerns.  If you have a medical emergency, go to the nearest emergency room or call 911.  A surgeon from [facility] is always on call at the hospital. °[address], [address], [place], [place]  [address] ? [address] [address], [place],    [address] °[phone] ? [phone] ? FAX [phone] °Web site:   [email or web address] ______[facility] LAPAROSCOPIC  SURGERY: POST OP INSTRUCTIONS Always review your discharge instruction sheet given to you by the facility where your surgery was performed. IF YOU HAVE DISABILITY OR FAMILY LEAVE FORMS, YOU MUST BRING THEM TO THE OFFICE FOR PROCESSING.   DO NOT GIVE THEM TO YOUR DOCTOR.  11. A prescription for pain medication may be given to you upon discharge.  Take your pain medication as prescribed, if needed.  If narcotic pain medicine is not needed, then you may take acetaminophen (Tylenol) or ibuprofen (Advil) as needed. 12. Take your usually prescribed medications unless otherwise directed. 13. If you need a refill on your pain medication, please contact your pharmacy.  They will contact our office to request authorization. Prescriptions will not be filled after 5pm or on week-ends. 14. You should follow a light diet the first few days after arrival home, such as soup and crackers, etc.  Be sure to include lots of fluids daily. 15. Most patients will experience some swelling and bruising in the area of the incisions.  Ice packs will help.  Swelling and bruising can take several days to resolve.  16. It is common to experience some constipation if taking pain medication after surgery.  Increasing fluid intake and taking a stool softener (such as Colace) will usually help or prevent this problem from occurring.  A mild laxative (Milk of Magnesia or Miralax) should be taken according to package instructions if there are no bowel movements after 48 hours. 17. Unless discharge instructions indicate otherwise, you may remove your bandages 24-48 hours after surgery, and you may shower at that time.  You may have steri-strips (small skin tapes) in place directly over the incision.  These strips should be left on the skin for 7-10 days.  If your surgeon used skin glue on the incision, you may shower in 24 hours.  The glue will flake off over the next 2-3 weeks.  Any sutures or staples will be removed at the office during  your follow-up visit. 18. ACTIVITIES:  You may resume regular (light) daily activities beginning the next day--such as daily self-care, walking, climbing stairs--gradually increasing activities as tolerated.  You may have sexual intercourse when it is comfortable.  Refrain from any heavy lifting or straining until approved by your doctor. a. You may drive when you are no longer taking prescription pain medication, you can comfortably wear a seatbelt, and you can safely maneuver your car and apply brakes. b. RETURN TO WORK:  __________________________________________________________ 19. You should see your doctor in the office for a follow-up appointment approximately 2-3 weeks after your surgery.  Make sure that you call for this appointment within a day or two after you arrive home to insure a convenient appointment time. 20. OTHER INSTRUCTIONS: __________________________________________________________________________________________________________________________ __________________________________________________________________________________________________________________________ WHEN TO CALL YOUR DOCTOR: 6. Fever over 101.0 7. Inability to urinate 8. Continued bleeding from incision. 9. Increased pain, redness, or drainage from the incision. 10. Increasing abdominal pain  The clinic staff is available to answer your questions during regular business hours.  Please dont hesitate to call and ask to speak to one of the nurses for clinical concerns.  If you have a medical emergency, go to the nearest emergency room or call 911.  A surgeon from [facility] is always on call at the hospital. [address], [place], [place], [place]  [address] ? [address], [place], [place]   [address] [phone] ? [phone] ? FAX [phone] Web  site: [email or web address]         General Anesthesia, Adult, Care After Refer to this sheet in the next few weeks.  These instructions provide you with information on caring for yourself after your procedure. Your health care provider may also give you more specific instructions. Your treatment has been planned according to current medical practices, but problems sometimes occur. Call your health care provider if you have any problems or questions after your procedure. WHAT TO EXPECT AFTER THE PROCEDURE After the procedure, it is typical to experience:  Sleepiness.  Nausea and vomiting. HOME CARE INSTRUCTIONS  For the first 24 hours after general anesthesia:  Have a responsible person with you.  Do not drive a car. If you are alone, do not take public transportation.  Do not drink alcohol.  Do not take medicine that has not been prescribed by your health care provider.  Do not sign important papers or make important decisions.  You may resume a normal diet and activities as directed by your health care provider.  Change bandages (dressings) as directed.  If you have questions or problems that seem related to general anesthesia, call the hospital and ask for the anesthetist or anesthesiologist on call. SEEK MEDICAL CARE IF:  You have nausea and vomiting that continue the day after anesthesia.  You develop a rash. SEEK IMMEDIATE MEDICAL CARE IF:   You have difficulty breathing.  You have chest pain.  You have any allergic problems.   This information is not intended to replace advice given to you by your health care provider. Make sure you discuss any questions you have with your health care provider.   Document Released: 10/27/2000 Document Revised: 08/11/2014 Document Reviewed: 11/19/2011 Elsevier Interactive Patient Education [employer].

## 2015-12-04 NOTE — Anesthesia Procedure Notes (Signed)
Procedure Name: Intubation Date/Time: 12/04/2015 9:00 AM Performed by: Dione Booze Pre-anesthesia Checklist: Patient identified, Emergency Drugs available, Suction available and Patient being monitored Patient Re-evaluated:Patient Re-evaluated prior to inductionOxygen Delivery Method: Circle system utilized Preoxygenation: Pre-oxygenation with 100% oxygen Intubation Type: IV induction Laryngoscope Size: Glidescope and 4 Grade View: Grade III Tube type: Oral Tube size: 7.5 mm Number of attempts: 1 Airway Equipment and Method: Stylet and Video-laryngoscopy Placement Confirmation: ETT inserted through vocal cords under direct vision,  breath sounds checked- equal and bilateral and positive ETCO2 Secured at: 22 cm Tube secured with: Tape Dental Injury: Teeth and Oropharynx as per pre-operative assessment  Difficulty Due To: Difficulty was anticipated, Difficult Airway- due to anterior larynx, Difficult Airway- due to limited oral opening and Difficult Airway- due to reduced neck mobility Comments: Known difficult intubation, glidescope used initially.

## 2016-02-02 DIAGNOSIS — J069 Acute upper respiratory infection, unspecified: Secondary | ICD-10-CM | POA: Diagnosis not present

## 2016-02-15 DIAGNOSIS — N400 Enlarged prostate without lower urinary tract symptoms: Secondary | ICD-10-CM | POA: Diagnosis not present

## 2016-02-15 DIAGNOSIS — N200 Calculus of kidney: Secondary | ICD-10-CM | POA: Diagnosis not present

## 2016-02-26 ENCOUNTER — Encounter: Payer: Self-pay | Admitting: Podiatry

## 2016-02-26 ENCOUNTER — Ambulatory Visit (INDEPENDENT_AMBULATORY_CARE_PROVIDER_SITE_OTHER): Payer: Medicare Other

## 2016-02-26 ENCOUNTER — Ambulatory Visit (INDEPENDENT_AMBULATORY_CARE_PROVIDER_SITE_OTHER): Payer: Medicare Other | Admitting: Podiatry

## 2016-02-26 VITALS — BP 142/71 | HR 72 | Resp 16 | Ht 70.0 in | Wt 205.0 lb

## 2016-02-26 DIAGNOSIS — Q828 Other specified congenital malformations of skin: Secondary | ICD-10-CM

## 2016-02-26 DIAGNOSIS — M79671 Pain in right foot: Secondary | ICD-10-CM | POA: Diagnosis not present

## 2016-02-26 DIAGNOSIS — M2041 Other hammer toe(s) (acquired), right foot: Secondary | ICD-10-CM | POA: Diagnosis not present

## 2016-02-26 NOTE — Progress Notes (Signed)
   Subjective:    Patient ID: Jake Barnett, male    DOB: 1940-12-20, 75 y.o.   MRN: [id number]  HPI: He presents today with chief complaint of painful fifth digit of the right foot. He states this then this way for about 6 months on and off I tried over-the-counter treatments and nothing really seems to help. It just keeps coming back.    Review of Systems  All other systems reviewed and are negative.      Objective:   Physical Exam: Vital signs are stable he is alert and oriented 3 pulses are palpable. Neurologic sensorium is intact percent lasting monofilament. Deep tendon reflexes are intact. Muscle strength is 5 over 5 dorsiflexion plantar flexors and inverters everters all into the musculature is intact. Orthopedic evaluation demonstrates mild adductovarus rotated hammertoe fifth digit of the right foot resulting in irritation to the lateral aspect of the PIPJ fourth digit right foot and the distal medial aspect of the fifth toe. Cutaneous evaluation does demonstrate reactive hyperkeratosis to the areas. No open lesions or wounds. Radiographs confirm adductovarus rotated hammertoe deformities with medial spur to the distal phalanx fifth digit right foot.        Assessment & Plan:  Adductovarus rotated hammertoe deformity resulting in reactive clavus.  Plan: Debridement porokeratotic lesion and follow up with me as needed for surgical consultation regarding derotation of the toe and exostectomy.

## 2016-03-18 DIAGNOSIS — D485 Neoplasm of uncertain behavior of skin: Secondary | ICD-10-CM | POA: Diagnosis not present

## 2016-03-18 DIAGNOSIS — L821 Other seborrheic keratosis: Secondary | ICD-10-CM | POA: Diagnosis not present

## 2016-03-18 DIAGNOSIS — L578 Other skin changes due to chronic exposure to nonionizing radiation: Secondary | ICD-10-CM | POA: Diagnosis not present

## 2016-03-18 DIAGNOSIS — L820 Inflamed seborrheic keratosis: Secondary | ICD-10-CM | POA: Diagnosis not present

## 2016-05-27 ENCOUNTER — Ambulatory Visit (INDEPENDENT_AMBULATORY_CARE_PROVIDER_SITE_OTHER): Payer: Medicare Other

## 2016-05-27 DIAGNOSIS — Z23 Encounter for immunization: Secondary | ICD-10-CM

## 2016-07-11 ENCOUNTER — Other Ambulatory Visit: Payer: Self-pay

## 2016-09-04 DIAGNOSIS — R109 Unspecified abdominal pain: Secondary | ICD-10-CM | POA: Diagnosis not present

## 2016-09-12 DIAGNOSIS — M5136 Other intervertebral disc degeneration, lumbar region: Secondary | ICD-10-CM | POA: Diagnosis not present

## 2016-09-12 DIAGNOSIS — M1611 Unilateral primary osteoarthritis, right hip: Secondary | ICD-10-CM | POA: Diagnosis not present

## 2016-09-18 DIAGNOSIS — B353 Tinea pedis: Secondary | ICD-10-CM | POA: Diagnosis not present

## 2016-09-18 DIAGNOSIS — L570 Actinic keratosis: Secondary | ICD-10-CM | POA: Diagnosis not present

## 2016-09-18 DIAGNOSIS — L821 Other seborrheic keratosis: Secondary | ICD-10-CM | POA: Diagnosis not present

## 2016-09-18 DIAGNOSIS — L820 Inflamed seborrheic keratosis: Secondary | ICD-10-CM | POA: Diagnosis not present

## 2016-09-18 DIAGNOSIS — B351 Tinea unguium: Secondary | ICD-10-CM | POA: Diagnosis not present

## 2016-09-26 DIAGNOSIS — M5136 Other intervertebral disc degeneration, lumbar region: Secondary | ICD-10-CM | POA: Diagnosis not present

## 2017-01-22 ENCOUNTER — Encounter: Payer: Self-pay | Admitting: Cardiology

## 2017-01-26 NOTE — Progress Notes (Signed)
HPI: FU palpitations and chest pain. Previous monitor in 2009 showed sinus with occasional PVC and PAC and short nonsustained run of atrial tachycardia (4 beats). Stress echocardiogram in May of 8850 showed diastolic dysfunction but no ECG changes and no wall motion abnormalities. Patient had a Myoview in July of 2012 and showed an ejection fraction of 66% and normal perfusion. Exercise treadmill November 2015 was negative. Since last seen, there is no dyspnea on exertion, orthopnea, PND or pedal edema. Occasional brief palpitations after eating. No syncope.  Current Outpatient Prescriptions  Medication Sig Dispense Refill  . fluconazole (DIFLUCAN) 200 MG tablet Take 1 tablet by mouth 2 (two) times a week.  5   No current facility-administered medications for this visit.      Past Medical History:  Diagnosis Date  . Arthritis   . Cancer (HCC)    hx of basal cell skin cancer   . Cholelithiasis with chronic cholecystitis 12/04/2015  . Chronic kidney disease    kidney stones  . Difficult intubation 08/24/2015  . GERD (gastroesophageal reflux disease)   . H/O hiatal hernia   . History of kidney stones   . Hyperlipidemia   . Left ureteral calculus   . Mild diastolic dysfunction    AYMPTOMATIC  . Palpitations    occasional PAC/PVC.  Marland Kitchen PONV (postoperative nausea and vomiting)   . RBBB     Past Surgical History:  Procedure Laterality Date  . ANTERIOR CERVICAL DECOMP/DISCECTOMY FUSION  2001   C5  -- C7- some limited hyperflexion of neck  . CARDIOVASCULAR STRESS TEST  02-11-2011   DR CRENSHAW   NORMAL / NO ISCHEMIA/ EF 66%  . CHOLECYSTECTOMY N/A 12/04/2015   Procedure: LAPAROSCOPIC CHOLECYSTECTOMY;  Surgeon: Fanny Skates, MD;  Location: [facility];  Service: General;  Laterality: N/A;  . CYSTO/ BILATERAL LASER LITHOTRIPSY WITH STONE EXTRACTIONS  05-26-2011  . CYSTOSCOPY W/ URETERAL STENT PLACEMENT Left 05/13/2013   Procedure: CYSTOSCOPY WITH RETROGRADE PYELOGRAM/URETERAL STENT  PLACEMENT;  Surgeon: Bernestine Amass, MD;  Location: [facility];  Service: Urology;  Laterality: Left;  . CYSTOSCOPY WITH RETROGRADE PYELOGRAM, URETEROSCOPY AND STENT PLACEMENT Left 06/15/2013   Procedure: STENT REMOVAL/CYSTOSCOPY//URETEROSCOPYSTONE EXTRACTION WITH BASKET;  Surgeon: Bernestine Amass, MD;  Location: [facility];  Service: Urology;  Laterality: Left;  . HOLMIUM LASER APPLICATION Left 27/74/1287   Procedure: HOLMIUM LASER APPLICATION;  Surgeon: Bernestine Amass, MD;  Location: [facility];  Service: Urology;  Laterality: Left;  . INGUINAL HERNIA REPAIR Bilateral 08/24/2015   Procedure: LAPAROSCOPIC BILATERAL INGUINAL HERNIA REPAIR ;  Surgeon: Jackolyn Confer, MD;  Location: [facility];  Service: General;  Laterality: Bilateral;  . INSERTION OF MESH Bilateral 08/24/2015   Procedure: WITH INSERTION OF MESH;  Surgeon: Jackolyn Confer, MD;  Location: [facility];  Service: General;  Laterality: Bilateral;  . KNEE ARTHROSCOPY Right 2008  . SHOULDER ARTHROSCOPY Right 2007  . TOTAL KNEE ARTHROPLASTY Left 04/18/2013   Procedure: LEFT TOTAL KNEE ARTHROPLASTY;  Surgeon: Gearlean Alf, MD;  Location: [facility];  Service: Orthopedics;  Laterality: Left;  . TRANSTHORACIC ECHOCARDIOGRAM  08-07-2008   NORMAL LVF/  EF 55-60%  . TYMPANOPLASTY Right 1971   mild hearing loss    Social History   Social History  . Marital status: Married    Spouse name: N/A  . Number of children: N/A  . Years of education: N/A   Occupational History  . Not on file.   Social History Main Topics  .  Smoking status: Never Smoker  . Smokeless tobacco: Never Used  . Alcohol use No  . Drug use: No  . Sexual activity: Not on file   Other Topics Concern  . Not on file   Social History Narrative  . No narrative on file    Family History  Problem Relation Age of Onset  . Heart failure Mother   . Aortic stenosis Mother   . Heart disease Mother   . Hypertension Father     ROS: Arthralgias but no fevers or chills,  productive cough, hemoptysis, dysphasia, odynophagia, melena, hematochezia, dysuria, hematuria, rash, seizure activity, orthopnea, PND, pedal edema, claudication. Remaining systems are negative.  Physical Exam: Well-developed well-nourished in no acute distress.  Skin is warm and dry.  HEENT is normal.  Neck is supple. No bruits Chest is clear to auscultation with normal expansion.  Cardiovascular exam is regular rate and rhythm.  Abdominal exam nontender or distended. No masses palpated. No bruit  Extremities show no edema. neuro grossly intact  ECG- Sinus rhythm at a rate of 67. No ST changes. personally reviewed  A/P  1 Elevated BP-blood pressure is mildly elevated today. However he follows this closely at home and keeps records. It is typically controlled. Continue present medications and follow.  2 history of palpitations- patient continues to have occasional brief palpitations but they are not particular problematic. We could consider adding a beta blocker in the future if they worsen.   Kirk Ruths, MD

## 2017-02-09 ENCOUNTER — Ambulatory Visit (INDEPENDENT_AMBULATORY_CARE_PROVIDER_SITE_OTHER): Payer: Medicare Other | Admitting: Cardiology

## 2017-02-09 ENCOUNTER — Encounter: Payer: Self-pay | Admitting: Cardiology

## 2017-02-09 VITALS — BP 152/92 | HR 67 | Ht 70.0 in | Wt 217.0 lb

## 2017-02-09 DIAGNOSIS — R030 Elevated blood-pressure reading, without diagnosis of hypertension: Secondary | ICD-10-CM

## 2017-02-09 DIAGNOSIS — R002 Palpitations: Secondary | ICD-10-CM

## 2017-02-09 NOTE — Patient Instructions (Signed)
Your physician wants you to follow-up in: ONE YEAR WITH DR CRENSHAW You will receive a reminder letter in the mail two months in advance. If you don't receive a letter, please call our office to schedule the follow-up appointment.   If you need a refill on your cardiac medications before your next appointment, please call your pharmacy.  

## 2017-02-18 DIAGNOSIS — N200 Calculus of kidney: Secondary | ICD-10-CM | POA: Diagnosis not present

## 2017-02-18 DIAGNOSIS — N400 Enlarged prostate without lower urinary tract symptoms: Secondary | ICD-10-CM | POA: Diagnosis not present

## 2017-03-12 DIAGNOSIS — H40033 Anatomical narrow angle, bilateral: Secondary | ICD-10-CM | POA: Diagnosis not present

## 2017-03-12 DIAGNOSIS — H2513 Age-related nuclear cataract, bilateral: Secondary | ICD-10-CM | POA: Diagnosis not present

## 2017-03-18 DIAGNOSIS — L570 Actinic keratosis: Secondary | ICD-10-CM | POA: Diagnosis not present

## 2017-03-18 DIAGNOSIS — B351 Tinea unguium: Secondary | ICD-10-CM | POA: Diagnosis not present

## 2017-03-18 DIAGNOSIS — L820 Inflamed seborrheic keratosis: Secondary | ICD-10-CM | POA: Diagnosis not present

## 2017-03-18 DIAGNOSIS — D485 Neoplasm of uncertain behavior of skin: Secondary | ICD-10-CM | POA: Diagnosis not present

## 2017-04-07 DIAGNOSIS — M109 Gout, unspecified: Secondary | ICD-10-CM | POA: Diagnosis not present

## 2017-04-16 DIAGNOSIS — M79672 Pain in left foot: Secondary | ICD-10-CM | POA: Diagnosis not present

## 2017-04-16 DIAGNOSIS — M10072 Idiopathic gout, left ankle and foot: Secondary | ICD-10-CM | POA: Diagnosis not present

## 2017-05-01 DIAGNOSIS — N1330 Unspecified hydronephrosis: Secondary | ICD-10-CM | POA: Diagnosis not present

## 2017-05-01 DIAGNOSIS — R109 Unspecified abdominal pain: Secondary | ICD-10-CM | POA: Diagnosis not present

## 2017-05-01 DIAGNOSIS — N201 Calculus of ureter: Secondary | ICD-10-CM | POA: Diagnosis not present

## 2017-05-06 DIAGNOSIS — N201 Calculus of ureter: Secondary | ICD-10-CM | POA: Diagnosis not present

## 2017-06-09 ENCOUNTER — Ambulatory Visit (INDEPENDENT_AMBULATORY_CARE_PROVIDER_SITE_OTHER): Payer: Medicare Other

## 2017-06-09 DIAGNOSIS — Z23 Encounter for immunization: Secondary | ICD-10-CM

## 2017-07-08 NOTE — Progress Notes (Deleted)
Subjective: VV:OHYWVPXTG care, *** HPI: Jake Barnett is a 76 y.o. male presenting to clinic today for:  1. ***  Past Medical History:  Diagnosis Date  . Arthritis   . Cancer (HCC)    hx of basal cell skin cancer   . Cholelithiasis with chronic cholecystitis 12/04/2015  . Chronic kidney disease    kidney stones  . Difficult intubation 08/24/2015  . GERD (gastroesophageal reflux disease)   . H/O hiatal hernia   . History of kidney stones   . Hyperlipidemia   . Left ureteral calculus   . Mild diastolic dysfunction    AYMPTOMATIC  . Palpitations    occasional PAC/PVC.  Marland Kitchen PONV (postoperative nausea and vomiting)   . RBBB    Past Surgical History:  Procedure Laterality Date  . ANTERIOR CERVICAL DECOMP/DISCECTOMY FUSION  2001   C5  -- C7- some limited hyperflexion of neck  . CARDIOVASCULAR STRESS TEST  02-11-2011   DR CRENSHAW   NORMAL / NO ISCHEMIA/ EF 66%  . CHOLECYSTECTOMY N/A 12/04/2015   Procedure: LAPAROSCOPIC CHOLECYSTECTOMY;  Surgeon: Fanny Skates, MD;  Location: [facility];  Service: General;  Laterality: N/A;  . CYSTO/ BILATERAL LASER LITHOTRIPSY WITH STONE EXTRACTIONS  05-26-2011  . CYSTOSCOPY W/ URETERAL STENT PLACEMENT Left 05/13/2013   Procedure: CYSTOSCOPY WITH RETROGRADE PYELOGRAM/URETERAL STENT PLACEMENT;  Surgeon: Bernestine Amass, MD;  [facility];  Service: Urology;  Laterality: Left;  . CYSTOSCOPY WITH RETROGRADE PYELOGRAM, URETEROSCOPY AND STENT PLACEMENT Left 06/15/2013   Procedure: STENT REMOVAL/CYSTOSCOPY//URETEROSCOPYSTONE EXTRACTION WITH BASKET;  Surgeon: Bernestine Amass, MD;  Location: [facility];  Service: Urology;  Laterality: Left;  . HOLMIUM LASER APPLICATION Left 62/69/4854   Procedure: HOLMIUM LASER APPLICATION;  Surgeon: Bernestine Amass, MD;  Location: [facility];  Service: Urology;  Laterality: Left;  . INGUINAL HERNIA REPAIR Bilateral 08/24/2015   Procedure: LAPAROSCOPIC BILATERAL INGUINAL HERNIA REPAIR ;  Surgeon: Jackolyn Confer,  MD;  Location: [facility];  Service: General;  Laterality: Bilateral;  . INSERTION OF MESH Bilateral 08/24/2015   Procedure: WITH INSERTION OF MESH;  Surgeon: Jackolyn Confer, MD;  Location: [facility];  Service: General;  Laterality: Bilateral;  . KNEE ARTHROSCOPY Right 2008  . SHOULDER ARTHROSCOPY Right 2007  . TOTAL KNEE ARTHROPLASTY Left 04/18/2013   Procedure: LEFT TOTAL KNEE ARTHROPLASTY;  Surgeon: Gearlean Alf, MD;  Location: [facility];  Service: Orthopedics;  Laterality: Left;  . TRANSTHORACIC ECHOCARDIOGRAM  08-07-2008   NORMAL LVF/  EF 55-60%  . TYMPANOPLASTY Right 1971   mild hearing loss   Social History   Socioeconomic History  . Marital status: Married    Spouse name: Not on file  . Number of children: Not on file  . Years of education: Not on file  . Highest education level: Not on file  Social Needs  . Financial resource strain: Not on file  . Food insecurity - worry: Not on file  . Food insecurity - inability: Not on file  . Transportation needs - medical: Not on file  . Transportation needs - non-medical: Not on file  Occupational History  . Not on file  Tobacco Use  . Smoking status: Never Smoker  . Smokeless tobacco: Never Used  Substance and Sexual Activity  . Alcohol use: No  . Drug use: No  . Sexual activity: Not on file  Other Topics Concern  . Not on file  Social History Narrative  . Not on file   No outpatient medications have been  marked as taking for the 07/14/17 encounter (Appointment) with Janora Norlander, DO.   Family History  Problem Relation Age of Onset  . Heart failure Mother   . Aortic stenosis Mother   . Heart disease Mother   . Hypertension Father    Allergies  Allergen Reactions  . Indomethacin Nausea Only and Other (See Comments)    Stomach ache     Health Maintenance: ***  Flu Vaccine: {YES/NO/WILD SJGGE:36629}  Tdap Vaccine: {YES/NO/WILD UTMLY:65035}  - every 66yrs - (<3 lifetime doses or unknown): all wounds -- look up  need for Tetanus IG - (>=3 lifetime doses): clean/minor wound if >58yrs from previous; all other wounds if >76yrs from previous Zoster Vaccine: {YES/NO/WILD CARDS:18581} (those >50yo, once) Pneumonia Vaccine: {YES/NO/WILD WSFKC:12751} (those w/ risk factors) - (<78yr) Both: Immunocompromised, cochlear implant, CSF leak, asplenic, sickle cell, Chronic Renal Failure - (<78yr) PPSV-23 only: Heart dz, lung disease, DM, tobacco abuse, alcoholism, cirrhosis/liver disease. - (>62yr): PPSV13 then PPSV23 in 6-12mths;  - (>64yr): repeat PPSV23 once if pt received prior to 75yo and 75yrs have passed  ROS: Per HPI  Objective: Office vital signs reviewed. There were no vitals taken for this visit.  Physical Examination:  General: Awake, alert, *** nourished, No acute distress HEENT: Normal    Neck: No masses palpated. No lymphadenopathy    Ears: Tympanic membranes intact, normal light reflex, no erythema, no bulging    Eyes: PERRLA, extraocular movement in tact, sclera ***    Nose: nasal turbinates moist, *** nasal discharge    Throat: moist mucus membranes, no erythema, *** tonsillar exudate.  Airway is patent Cardio: regular rate and rhythm, S1S2 heard, no murmurs appreciated Pulm: clear to auscultation bilaterally, no wheezes, rhonchi or rales; normal work of breathing on room air GI: soft, non-tender, non-distended, bowel sounds present x4, no hepatomegaly, no splenomegaly, no masses GU: external vaginal tissue ***, cervix ***, *** punctate lesions on cervix appreciated, *** discharge from cervical os, *** bleeding, *** cervical motion tenderness, *** abdominal/ adnexal masses Extremities: warm, well perfused, No edema, cyanosis or clubbing; +*** pulses bilaterally MSK: *** gait and *** station Skin: dry; intact; no rashes or lesions Neuro: *** Strength and light touch sensation grossly intact, *** DTRs ***/4  Assessment/ Plan: 76 y.o. male   No problem-specific Assessment & Plan notes found  for this encounter.   Janora Norlander, DO [place] [phone]

## 2017-07-14 ENCOUNTER — Ambulatory Visit: Payer: Medicare Other | Admitting: Family Medicine

## 2017-07-15 ENCOUNTER — Ambulatory Visit: Payer: Medicare Other | Admitting: Family Medicine

## 2017-07-16 ENCOUNTER — Encounter: Payer: Self-pay | Admitting: Family Medicine

## 2017-07-16 ENCOUNTER — Ambulatory Visit (INDEPENDENT_AMBULATORY_CARE_PROVIDER_SITE_OTHER): Payer: Medicare Other | Admitting: Family Medicine

## 2017-07-16 VITALS — BP 141/81 | HR 77 | Temp 97.6°F | Ht 70.0 in | Wt 216.0 lb

## 2017-07-16 DIAGNOSIS — Z1159 Encounter for screening for other viral diseases: Secondary | ICD-10-CM | POA: Diagnosis not present

## 2017-07-16 DIAGNOSIS — Z23 Encounter for immunization: Secondary | ICD-10-CM

## 2017-07-16 DIAGNOSIS — Z0000 Encounter for general adult medical examination without abnormal findings: Secondary | ICD-10-CM

## 2017-07-16 DIAGNOSIS — I10 Essential (primary) hypertension: Secondary | ICD-10-CM | POA: Diagnosis not present

## 2017-07-16 DIAGNOSIS — M19041 Primary osteoarthritis, right hand: Secondary | ICD-10-CM | POA: Diagnosis not present

## 2017-07-16 DIAGNOSIS — Z114 Encounter for screening for human immunodeficiency virus [HIV]: Secondary | ICD-10-CM | POA: Diagnosis not present

## 2017-07-16 DIAGNOSIS — Z7689 Persons encountering health services in other specified circumstances: Secondary | ICD-10-CM

## 2017-07-16 DIAGNOSIS — E782 Mixed hyperlipidemia: Secondary | ICD-10-CM

## 2017-07-16 NOTE — Patient Instructions (Addendum)
Your insurance will not cover for PSA testing to screen for prostate cancer given your age.  If you desire to pay out-of-pocket for this please let me know and I will add this to your labs.  Otherwise, plan to come in for fasting labs at your convenience.  I value your feedback and appreciate you entrusting [place] with your care.  If you get a survey, I would appreciate your taking the time to let us know what your experience was like.   Create and/or bring a copy of your Living Will/ Advanced Directive into the office so that we may respect your wishes should an emergency occur.   Get the recommended life-saving vaccines we discussed today.   Make healthy food choices (Rich in fruits/ veggies/ lean meats and low in salt, sugar and fat)   Do something that you enjoy for at least 30 minutes every day to stay active (walking, gardening, swimming, etc). This will help you lower your risk of falls/ broken bones.   Be social, do puzzles/ crosswords.  These things help the mind stay young and lower your risk of developing dementia.   Make sure that your home is safe by checking your smoke detectors regularly and doing the things outlined below to lower your risk of falls.   Prostate-Specific Antigen Test Why am I having this test? The prostate-specific antigen (PSA) test is performed to determine how much PSA you have in your blood. PSA is a type of protein that is normally present in the prostate gland. Certain conditions can cause PSA blood levels to increase, such as:  Infection in the prostate (prostatitis).  Enlargement of the prostate (hypertrophy).  Prostate cancer.  Because PSA levels increase greatly from prostate cancer, this test can be used to confirm a diagnosis of prostate cancer. It may also be used to monitor treatment for prostate cancer and to watch for a return of prostate cancer after treatment has finished. This test has a very high false-positive rate. Therefore, routine  PSA screening for all men is no longer recommended. A false-positive result is incorrect because it indicates a condition or finding is present when it is not. What kind of sample is taken? A blood sample is required for this test. It is usually collected by inserting a needle into a vein or by sticking a finger with a small needle. How do I prepare for this test? There is no preparation required for this test. However, there are factors that can affect the results of a PSA test. To get the most accurate results:  Avoid having a rectal exam within several hours before having your blood drawn for this test.  Avoid having any procedures performed on the prostate gland within 6 weeks of having this test.  Avoid ejaculating within 24 hours of having this test.  Tell your health care provider if you had a recent urinary tract infection (UTI).  Tell your health care provider if you are taking medicines to assist with hair growth, such as finasteride.  Tell your health care provider if you have been exposed to a medicine called diethylstilbestrol.  Let your health care provider know if any of these factors apply to you. You may be asked to reschedule the test. What are the reference ranges? Reference ranges are established after testing a large group of people. Reference ranges may vary among different people, labs, and hospitals. It is your responsibility to obtain your test results. Ask the lab or department performing the test  when and how you will get your results.  Low: 0-2.5 ng/mL.  Slightly to moderately elevated: 2.6-10.0 ng/mL.  Moderately elevated: 10.0-19.9 ng/mL.  Significantly elevated: 20 ng/mL or greater.  What do the results mean? PSA test results greater than 4 ng/mL are found in the majority of men with prostate cancer. If your test result is above this level, this can indicate an increased risk for prostate cancer. Increased PSA levels can also indicate other health  conditions. Talk with your health care provider to discuss your results, treatment options, and if necessary, the need for more tests. Talk with your health care provider if you have any questions about your results. Talk with your health care provider to discuss your results, treatment options, and if necessary, the need for more tests. Talk with your health care provider if you have any questions about your results. This information is not intended to replace advice given to you by your health care provider. Make sure you discuss any questions you have with your health care provider. Document Released: 08/23/2004 Document Revised: 03/26/2016 Document Reviewed: 12/14/2013 Elsevier Interactive Patient Education  [employer].

## 2017-07-16 NOTE — Progress Notes (Signed)
Subjective: [id number] care, arthritis  HPI: Jake Barnett is a 76 y.o. male presenting to clinic today for physical exam and  1. Arthritis Patient notes that he has been having arthritis in his right wrist radiating down the right pointer finger for about a year.  Pain is waxing and waning.  He notes that motions like cupping his hand or wrist causes the symptoms to be worse.  He reports that he is very active with his hands.patient is right-hand dominant.  He denies weakness, numbness, tingling, injury to the hand.  No prior surgeries.  He does note a significant history of orthopedic surgeries including arthroscopy of the right shoulder and right knee.  He had his left knee replaced.  He also has a history of surgery to the his neck with a plate in place.  He has not taken any medications for the right wrist pain.  Denies swelling, erythema, increased warmth or fevers.  2. Preventative Patient denies family history of prostate cancer.  No personal history of BPH or prostate issues.  He does see a urologist for history of renal stones requiring surgical intervention x3.  Last renal stone was this year.  Followed by [facility] in [place].  He notes having had one pneumonia vaccine in his life.  Patient hospitalized last year for cholecystectomy, bilateral inguinal hernia repair.  Up-to-date on colonoscopy.  Has never been checked for HIV or hepatitis C.  No known exposures.  Patient never a smoker.  Has not had lipids or basic labs done in greater than 1 year.  Review of systems: Patient denies visual disturbance, changes in hearing, headache, dizziness, difficulty swallowing, changes in voice, chest pain, shortness of breath, abdominal pain, nausea, vomiting, constipation, diarrhea, blood in stool, difficulty urinating, hematuria, skin lesions, anxiety or depressive symptoms.  No new or neurologic issues including numbness or tingling.  No weakness. He reports a long-standing history of  multiple orthopedic issues.  No significant pain due to surgeries.  Right wrist pain as above.  Past Medical History:  Diagnosis Date  . Arthritis   . Cancer (HCC)    hx of basal cell skin cancer   . Cholelithiasis with chronic cholecystitis 12/04/2015  . Chronic kidney disease    kidney stones  . Difficult intubation 08/24/2015  . GERD (gastroesophageal reflux disease)   . H/O hiatal hernia   . History of kidney stones   . Hyperlipidemia   . Left ureteral calculus   . Mild diastolic dysfunction    AYMPTOMATIC  . Palpitations    occasional PAC/PVC.  Marland Kitchen PONV (postoperative nausea and vomiting)   . RBBB    Past Surgical History:  Procedure Laterality Date  . ANTERIOR CERVICAL DECOMP/DISCECTOMY FUSION  2001   C5  -- C7- some limited hyperflexion of neck  . CARDIOVASCULAR STRESS TEST  02-11-2011   DR CRENSHAW   NORMAL / NO ISCHEMIA/ EF 66%  . CHOLECYSTECTOMY N/A 12/04/2015   Procedure: LAPAROSCOPIC CHOLECYSTECTOMY;  Surgeon: Fanny Skates, MD;  Location: [facility];  Service: General;  Laterality: N/A;  . CYSTO/ BILATERAL LASER LITHOTRIPSY WITH STONE EXTRACTIONS  05-26-2011  . CYSTOSCOPY W/ URETERAL STENT PLACEMENT Left 05/13/2013   Procedure: CYSTOSCOPY WITH RETROGRADE PYELOGRAM/URETERAL STENT PLACEMENT;  Surgeon: Bernestine Amass, MD;  Location: [facility];  Service: Urology;  Laterality: Left;  . CYSTOSCOPY WITH RETROGRADE PYELOGRAM, URETEROSCOPY AND STENT PLACEMENT Left 06/15/2013   Procedure: STENT REMOVAL/CYSTOSCOPY//URETEROSCOPYSTONE EXTRACTION WITH BASKET;  Surgeon: Bernestine Amass, MD;  Location: [facility];  Service: Urology;  Laterality: Left;  . HOLMIUM LASER APPLICATION Left 09/32/6712   Procedure: HOLMIUM LASER APPLICATION;  Surgeon: Bernestine Amass, MD;  Location: [facility];  Service: Urology;  Laterality: Left;  . INGUINAL HERNIA REPAIR Bilateral 08/24/2015   Procedure: LAPAROSCOPIC BILATERAL INGUINAL HERNIA REPAIR ;  Surgeon: Jackolyn Confer, MD;  Location: [facility];   Service: General;  Laterality: Bilateral;  . INSERTION OF MESH Bilateral 08/24/2015   Procedure: WITH INSERTION OF MESH;  Surgeon: Jackolyn Confer, MD;  Location: [facility];  Service: General;  Laterality: Bilateral;  . KNEE ARTHROSCOPY Right 2008  . SHOULDER ARTHROSCOPY Right 2007  . TOTAL KNEE ARTHROPLASTY Left 04/18/2013   Procedure: LEFT TOTAL KNEE ARTHROPLASTY;  Surgeon: Gearlean Alf, MD;  Location: [facility];  Service: Orthopedics;  Laterality: Left;  . TRANSTHORACIC ECHOCARDIOGRAM  08-07-2008   NORMAL LVF/  EF 55-60%  . TYMPANOPLASTY Right 1971   mild hearing loss   Social History   Socioeconomic History  . Marital status: Married    Spouse name: Not on file  . Number of children: Not on file  . Years of education: Not on file  . Highest education level: Not on file  Social Needs  . Financial resource strain: Not on file  . Food insecurity - worry: Not on file  . Food insecurity - inability: Not on file  . Transportation needs - medical: Not on file  . Transportation needs - non-medical: Not on file  Occupational History  . Not on file  Tobacco Use  . Smoking status: Never Smoker  . Smokeless tobacco: Never Used  Substance and Sexual Activity  . Alcohol use: No  . Drug use: No  . Sexual activity: Not on file  Other Topics Concern  . Not on file  Social History Narrative  . Not on file   No outpatient medications have been marked as taking for the 07/16/17 encounter (Appointment) with Janora Norlander, DO.   Family History  Problem Relation Age of Onset  . Heart failure Mother   . Aortic stenosis Mother   . Heart disease Mother   . Hypertension Father    Allergies  Allergen Reactions  . Indomethacin Nausea Only and Other (See Comments)    Stomach ache     Health Maintenance: second PNA vaccine ROS: Per HPI  Objective: Office vital signs reviewed. BP (!) 141/81   Pulse 77   Temp 97.6 F (36.4 C) (Oral)   Ht _0  (1.778 m)   Wt 216 lb (98 kg)    BMI 30.99 kg/m   Physical Examination:  General: Awake, alert, well nourished, well appearing elderly man, No acute distress HEENT: Normal    Neck: unable to rotate c-spine.  AROM limited in flexion and extension as well.    Ears: Tympanic membranes intact, normal light reflex, no erythema, no bulging    Eyes: PERRLA, extraocular movement in tact, sclera white    Nose: nasal turbinates moist, no nasal discharge    Throat: moist mucus membranes, no erythema, no tonsillar exudate.  Airway is patent Cardio: regular rate and rhythm, S1S2 heard, no murmurs appreciated Pulm: clear to auscultation bilaterally, no wheezes, rhonchi or rales; normal work of breathing on room air GI: soft, non-tender, non-distended, bowel sounds present x4, no hepatomegaly, no splenomegaly, no masses Extremities: warm, well perfused, No edema, cyanosis or clubbing; +2 pulses bilaterally  Right wrist: Patient has full active range of motion.  No swelling, erythema or ecchymosis appreciated.  No palpable bony abnormalities. MSK: normal gait and normal station Skin: dry; intact; no rashes or lesions Neuro: Strength 5/5 and light touch sensation grossly intact  Assessment/ Plan: 76 y.o. male   1. Arthritis of hand, right Has not use any medications for this up to this point.  I recommended starting with Tylenol arthritis up to 3 times daily as needed aching.  If pain is refractory to oral Tylenol, will consider oral anti-inflammatory.  I recommended that he follow up as needed this issue.  2. Essential hypertension Well-controlled without medication.  He is followed by Dr. Stanford Breed for occasional irregular heartbeats.  Last office visit was 09/10/2016.  He is not currently on any medications.  Will obtain CMP and lipid panel.  Patient will come in fasting for this sometime next week. - CMP14+EGFR; Future - Lipid Panel; Future  3. Mixed hyperlipidemia Not currently on a statin.  Reports fair diet. - Lipid Panel;  Future  4. Encounter to establish care with new doctor  5. Physical exam, annual We discussed preventative care.  He is interested in having PSA done despite the lack of prostate symptoms.  We did discuss that given his age PSAs not necessarily needed.  However, he does have at least a good 10 years ahead of him without significant comorbidities.  I did review with him that there is a potential for false elevations in PSAs, especially as he ages.  He will consider whether or not he would like to have this tested.  6. Encounter for hepatitis C screening test for low risk patient - Hepatitis C antibody; Future  7. Screening for HIV without presence of risk factors - HIV antibody (with reflex); Future   Ashly Windell Mould[place], [place] [phone]

## 2017-07-16 NOTE — Addendum Note (Signed)
Addended byCarrolyn Leigh on: 07/16/2017 04:36 PM   Modules accepted: Orders

## 2017-07-21 ENCOUNTER — Other Ambulatory Visit: Payer: Medicare Other

## 2017-07-21 DIAGNOSIS — Z1159 Encounter for screening for other viral diseases: Secondary | ICD-10-CM

## 2017-07-21 DIAGNOSIS — I10 Essential (primary) hypertension: Secondary | ICD-10-CM

## 2017-07-21 DIAGNOSIS — Z114 Encounter for screening for human immunodeficiency virus [HIV]: Secondary | ICD-10-CM | POA: Diagnosis not present

## 2017-07-21 DIAGNOSIS — E782 Mixed hyperlipidemia: Secondary | ICD-10-CM

## 2017-07-22 LAB — CMP14+EGFR
ALBUMIN: 4.1 g/dL (ref 3.5–4.8)
ALT: 8 IU/L (ref 0–44)
AST: 19 IU/L (ref 0–40)
Albumin/Globulin Ratio: 1.9 (ref 1.2–2.2)
Alkaline Phosphatase: 90 IU/L (ref 39–117)
BUN / CREAT RATIO: 13 (ref 10–24)
BUN: 13 mg/dL (ref 8–27)
Bilirubin Total: 0.9 mg/dL (ref 0.0–1.2)
CO2: 25 mmol/L (ref 20–29)
CREATININE: 1.04 mg/dL (ref 0.76–1.27)
Calcium: 8.9 mg/dL (ref 8.6–10.2)
Chloride: 106 mmol/L (ref 96–106)
GFR calc Af Amer: 81 mL/min/{1.73_m2} (ref >59)
GFR calc non Af Amer: 70 mL/min/{1.73_m2} (ref >59)
GLUCOSE: 84 mg/dL (ref 65–99)
Globulin, Total: 2.2 g/dL (ref 1.5–4.5)
Potassium: 4.4 mmol/L (ref 3.5–5.2)
Sodium: 145 mmol/L — ABNORMAL HIGH (ref 134–144)
Total Protein: 6.3 g/dL (ref 6.0–8.5)

## 2017-07-22 LAB — LIPID PANEL
CHOL/HDL RATIO: 3.9 ratio (ref 0.0–5.0)
Cholesterol, Total: 194 mg/dL (ref 100–199)
HDL: 50 mg/dL (ref >39)
LDL Calculated: 119 mg/dL — ABNORMAL HIGH (ref 0–99)
Triglycerides: 127 mg/dL (ref 0–149)
VLDL CHOLESTEROL CAL: 25 mg/dL (ref 5–40)

## 2017-07-22 LAB — HEPATITIS C ANTIBODY

## 2017-07-22 LAB — HIV ANTIBODY (ROUTINE TESTING W REFLEX): HIV SCREEN 4TH GENERATION: NONREACTIVE

## 2017-07-30 ENCOUNTER — Encounter: Payer: Self-pay | Admitting: *Deleted

## 2017-09-18 DIAGNOSIS — L570 Actinic keratosis: Secondary | ICD-10-CM | POA: Diagnosis not present

## 2017-09-18 DIAGNOSIS — L578 Other skin changes due to chronic exposure to nonionizing radiation: Secondary | ICD-10-CM | POA: Diagnosis not present

## 2017-09-18 DIAGNOSIS — L821 Other seborrheic keratosis: Secondary | ICD-10-CM | POA: Diagnosis not present

## 2017-09-18 DIAGNOSIS — L820 Inflamed seborrheic keratosis: Secondary | ICD-10-CM | POA: Diagnosis not present

## 2017-12-10 ENCOUNTER — Ambulatory Visit (INDEPENDENT_AMBULATORY_CARE_PROVIDER_SITE_OTHER): Payer: Medicare Other | Admitting: Family

## 2017-12-10 ENCOUNTER — Encounter: Payer: Self-pay | Admitting: Family

## 2017-12-10 VITALS — BP 128/67 | HR 69 | Temp 97.5°F | Ht 70.0 in | Wt 219.4 lb

## 2017-12-10 DIAGNOSIS — M7072 Other bursitis of hip, left hip: Secondary | ICD-10-CM

## 2017-12-10 DIAGNOSIS — M25551 Pain in right hip: Secondary | ICD-10-CM

## 2017-12-10 MED ORDER — PREDNISONE 10 MG (21) PO TBPK: ORAL_TABLET | ORAL | 0 refills | Status: DC

## 2017-12-10 NOTE — Progress Notes (Signed)
   Subjective:    Patient ID: Jake Barnett, male    DOB: [birth year], 77 y.o.   MRN: [id number]  Chief Complaint  Patient presents with  . Hip Pain    right side   Pt presents to the office today with recurrent hip pain. States he has seen Ortho for this in the past and was given prednisone and NSAID and it went away about a year ago.   States he had felt some achiness in it for a few weeks, but yesterday pressure washed his home and was up and down on a ladder several times and states the pain is worse.  Hip Pain   The incident occurred more than 1 week ago. There was no injury mechanism. The pain is present in the right hip. The pain is at a severity of 10/10. The pain is moderate. The pain has been intermittent since onset. Pertinent negatives include no numbness or tingling. He reports no foreign bodies present. The symptoms are aggravated by weight bearing and movement. He has tried NSAIDs and rest for the symptoms. The treatment provided mild relief.      Review of Systems  Musculoskeletal: Positive for arthralgias.  Neurological: Negative for tingling and numbness.  All other systems reviewed and are negative.      Objective:   Physical Exam  Constitutional: He is oriented to person, place, and time. He appears well-developed and well-nourished. No distress.  HENT:  Head: Normocephalic.  Eyes: Pupils are equal, round, and reactive to light. Right eye exhibits no discharge. Left eye exhibits no discharge.  Neck: No thyromegaly present.  Cardiovascular: Normal rate, regular rhythm, normal heart sounds and intact distal pulses.  No murmur heard. Pulmonary/Chest: Effort normal and breath sounds normal. No respiratory distress. He has no wheezes.  Abdominal: Soft. Bowel sounds are normal. He exhibits no distension. There is no tenderness.  Musculoskeletal: Normal range of motion. He exhibits tenderness. He exhibits no edema.  Full ROM of right hip, pain in outer hip with standing  and sitting.   Neurological: He is alert and oriented to person, place, and time. He has normal reflexes.  Skin: Skin is warm and dry. No rash noted. No erythema.  Psychiatric: He has a normal mood and affect. His behavior is normal. Judgment and thought content normal.  Vitals reviewed.    BP 128/67   Pulse 69   Temp (!) 97.5 F (36.4 C) (Oral)   Ht 5\' 10"  (1.778 m)   Wt 219 lb 6.4 oz (99.5 kg)   BMI 31.48 kg/m       Assessment & Plan:  Jake Barnett was seen today for hip pain.  Diagnoses and all orders for this visit:  Right hip pain -     predniSONE (STERAPRED UNI-PAK 21 TAB) 10 MG (21) TBPK tablet; Use as directed  Bursitis of right hip, unspecified bursa -     predniSONE (STERAPRED UNI-PAK 21 TAB) 10 MG (21) TBPK tablet; Use as directed   Continue Duexis Start prednisone  Rest Keep follow up with PCP, if pain continues may need Ortho referral   Evelina Dun, FNP

## 2017-12-10 NOTE — Patient Instructions (Addendum)
Hip Bursitis Hip bursitis is inflammation of a fluid-filled sac (bursa) in the hip joint. The bursa protects the bones in the hip joint from rubbing against each other. Hip bursitis can cause mild to moderate pain, and symptoms often come and go over time. What are the causes? This condition may be caused by:  Injury to the hip.  Overuse of the muscles that surround the hip joint.  Arthritis or gout.  Diabetes.  Thyroid disease.  Cold weather.  Infection.  In some cases, the cause may not be known. What are the signs or symptoms? Symptoms of this condition may include:  Mild or moderate pain in the hip area. Pain may get worse with movement.  Tenderness and swelling of the hip, especially on the outer side of the hip.  Symptoms may come and go. If the bursa becomes infected, you may have the following symptoms:  Fever.  Red skin and a feeling of warmth in the hip area.  How is this diagnosed? This condition may be diagnosed based on:  A physical exam.  Your medical history.  X-rays.  Removal of fluid from your inflamed bursa for testing (biopsy).  You may be sent to a health care provider who specializes in bone diseases (orthopedist) or a provider who specializes in joint inflammation (rheumatologist). How is this treated? This condition is treated by resting, raising (elevating), and applying pressure(compression) to the injured area. In some cases, this may be enough to make your symptoms go away. Treatment may also include:  Crutches.  Antibiotic medicine.  Draining fluid out of the bursa to help relieve swelling.  Injecting medicine that helps to reduce inflammation (cortisone).  Follow these instructions at home: Medicines  Take over-the-counter and prescription medicines only as told by your health care provider.  Do not drive or operate heavy machinery while taking prescription pain medicine, or as told by your health care provider.  If you were  prescribed an antibiotic, take it as told by your health care provider. Do not stop taking the antibiotic even if you start to feel better. Activity  Return to your normal activities as told by your health care provider. Ask your health care provider what activities are safe for you.  Rest and protect your hip as much as possible until your pain and swelling get better. General instructions  Wear compression wraps only as told by your health care provider.  Elevate your hip above the level of your heart as much as you can without pain. To do this, try putting a pillow under your hips while you lie down.  Do not use your hip to support your body weight until your health care provider says that you can. Use crutches as told by your health care provider.  Gently massage and stretch your injured area as often as is comfortable.  Keep all follow-up visits as told by your health care provider. This is important. How is this prevented?  Exercise regularly, as told by your health care provider.  Warm up and stretch before being active.  Cool down and stretch after being active.  If an activity irritates your hip or causes pain, avoid the activity as much as possible.  Avoid sitting down for long periods at a time. Contact a health care provider if:  You have a fever.  You develop new symptoms.  You have difficulty walking or doing everyday activities.  You have pain that gets worse or does not get better with medicine.  You   develop red skin or a feeling of warmth in your hip area. Get help right away if:  You cannot move your hip.  You have severe pain. This information is not intended to replace advice given to you by your health care provider. Make sure you discuss any questions you have with your health care provider. Document Released: 01/10/2002 Document Revised: 12/27/2015 Document Reviewed: 02/20/2015 Elsevier Interactive Patient Education  2018 [employer]  

## 2017-12-17 ENCOUNTER — Encounter: Payer: Self-pay | Admitting: Family Medicine

## 2017-12-17 ENCOUNTER — Ambulatory Visit (INDEPENDENT_AMBULATORY_CARE_PROVIDER_SITE_OTHER): Payer: Medicare Other | Admitting: Family Medicine

## 2017-12-17 VITALS — BP 138/70 | HR 71 | Temp 97.1°F | Ht 70.0 in | Wt 218.8 lb

## 2017-12-17 DIAGNOSIS — G5701 Lesion of sciatic nerve, right lower limb: Secondary | ICD-10-CM | POA: Diagnosis not present

## 2017-12-17 MED ORDER — METHYLPREDNISOLONE ACETATE 80 MG/ML IJ SUSP
80.0000 mg | Freq: Once | INTRAMUSCULAR | Status: AC
  Administered 2017-12-17: 80 mg via INTRAMUSCULAR

## 2017-12-17 NOTE — Progress Notes (Signed)
   HPI  Patient presents today for hip pain.  Patient explains he has had pain now for a couple of weeks.  He was seen 7 days ago and treated with Predpack, hess had improvement but still has throbbing R buttock pain without radiation  Fever, chills, sweats.  No injury.  He does have some old pain pills at home.  He is used some ibuprofen/famotidine pills with some improvement.  PMH: Smoking status noted ROS: Per HPI  Objective: BP 138/70   Pulse 71   Temp (!) 97.1 F (36.2 C) (Oral)   Ht 5\' 10"  (1.778 m)   Wt 218 lb 12.8 oz (99.2 kg)   BMI 31.39 kg/m  Gen: NAD, alert, cooperative with exam HEENT: NCAT CV: RRR, good S1/S2, no murmur Resp: CTABL, no wheezes, non-labored Ext: No edema, warm Neuro: Alert and oriented, No gross deficits MSK:  Significant tenderness to palpation of midline spine, paraspinal muscles, or  Assessment and plan:  #Piriformis syndrome Patient with right buttock pain with no consistent radiation but some intermittent questionable radiation. Given IM Depo-Medrol, discussed safe use of NSAIDs, his age I do not recommend them. Recommended using his leftover oxycodone if needed, however I believe the Depo-Medrol should help the most. Stretches for piriformis syndrome given from sports medicine patient advisor. Return to clinic with any concerns    Laroy Apple, MD [facility] 12/17/2017, 9:36 AM

## 2018-01-12 ENCOUNTER — Encounter: Payer: Self-pay | Admitting: Family Medicine

## 2018-01-12 ENCOUNTER — Ambulatory Visit (INDEPENDENT_AMBULATORY_CARE_PROVIDER_SITE_OTHER): Payer: Medicare Other | Admitting: Family Medicine

## 2018-01-12 ENCOUNTER — Ambulatory Visit (INDEPENDENT_AMBULATORY_CARE_PROVIDER_SITE_OTHER): Payer: Medicare Other

## 2018-01-12 VITALS — BP 143/73 | HR 72 | Temp 97.1°F | Ht 70.0 in | Wt 214.4 lb

## 2018-01-12 DIAGNOSIS — R1031 Right lower quadrant pain: Secondary | ICD-10-CM

## 2018-01-12 DIAGNOSIS — G8929 Other chronic pain: Secondary | ICD-10-CM

## 2018-01-12 DIAGNOSIS — M545 Low back pain: Secondary | ICD-10-CM | POA: Diagnosis not present

## 2018-01-12 DIAGNOSIS — M160 Bilateral primary osteoarthritis of hip: Secondary | ICD-10-CM | POA: Diagnosis not present

## 2018-01-12 LAB — MICROSCOPIC EXAMINATION
BACTERIA UA: NONE SEEN
EPITHELIAL CELLS (NON RENAL): NONE SEEN /HPF (ref 0–10)
RBC MICROSCOPIC, UA: NONE SEEN /HPF (ref 0–2)
RENAL EPITHEL UA: NONE SEEN /HPF
WBC, UA: NONE SEEN /hpf (ref 0–5)

## 2018-01-12 LAB — URINALYSIS, COMPLETE
Bilirubin, UA: NEGATIVE
Glucose, UA: NEGATIVE
Ketones, UA: NEGATIVE
Leukocytes, UA: NEGATIVE
NITRITE UA: NEGATIVE
PH UA: 6 (ref 5.0–7.5)
Protein, UA: NEGATIVE
RBC, UA: NEGATIVE
Specific Gravity, UA: 1.02 (ref 1.005–1.030)
Urobilinogen, Ur: 0.2 mg/dL (ref 0.2–1.0)

## 2018-01-12 NOTE — Patient Instructions (Signed)
Great to see you!   

## 2018-01-12 NOTE — Progress Notes (Signed)
   HPI  Patient presents today here with low back and groin pain.  Patient states that his previous pain which we felt was piriformis syndrome has improved and is gone.  No radiation down the leg now.  Patient now has right flank pain and right-sided low back pain radiating to the right groin.  Is worse with walking downhill. His long history of arthritis in multiple places, he has had multiple procedures for arthritis as well with arthroscopic the right shoulder right knee and cervical vertebral fusion.  Patient describes sharp pain, worse with certain movements. No hematuria. He does have history of kidney stones. No fever, chills, sweats Has follow-up with urology in about 1 month.  PMH: Smoking status noted ROS: Per HPI  Objective: BP (!) 143/73   Pulse 72   Temp (!) 97.1 F (36.2 C) (Oral)   Ht 5\' 10"  (1.778 m)   Wt 214 lb 6.4 oz (97.3 kg)   BMI 30.76 kg/m  Gen: NAD, alert, cooperative with exam HEENT: NCAT CV: RRR, good S1/S2, no murmur Resp: CTABL, no wheezes, non-labored Ext: No edema, warm Neuro: Alert and oriented, No gross deficits MSK:  No TTP of midline spine or right-sided paraspinal muscles, negative Faber, log rol, and Fadir testing R hip  Assessment and plan:  #Right low back pain, right groin pain Patient with possible right hip arthritis, plain film pending Also checking for degenerative changes of the spine Patient also has history of kidney stone with left-sided stone and right-sided stone seen on last CT. Urinalysis, if x-rays are unimpressive and hematuria is present we will go ahead and pursue CT scan. Patient also has history of bilateral inguinal hernia repair with some residual pain after that. Possible multifactorial pain Reassurance provided for now Work-up as above    Orders Placed This Encounter  Procedures  . DG Lumbar Spine 2-3 Views    Standing Status:   Future    Number of Occurrences:   1    Standing Expiration Date:    03/14/2019    Order Specific Question:   Reason for Exam (SYMPTOM  OR DIAGNOSIS REQUIRED)    Answer:   R froin pain and R low back pain    Order Specific Question:   Preferred imaging location?    Answer:   Internal  . DG HIP UNILAT W OR W/O PELVIS 2-3 VIEWS RIGHT    Standing Status:   Future    Number of Occurrences:   1    Standing Expiration Date:   01/12/2019    Order Specific Question:   Reason for Exam (SYMPTOM  OR DIAGNOSIS REQUIRED)    Answer:   R froin pain and R low back pain    Order Specific Question:   Preferred imaging location?    Answer:   Internal  . Urinalysis, Complete     Laroy Apple, MD [facility] 01/12/2018, 9:51 AM

## 2018-01-14 ENCOUNTER — Ambulatory Visit: Payer: Medicare Other

## 2018-01-14 ENCOUNTER — Other Ambulatory Visit: Payer: Self-pay | Admitting: Family Medicine

## 2018-01-14 ENCOUNTER — Other Ambulatory Visit: Payer: Medicare Other

## 2018-01-14 DIAGNOSIS — M8588 Other specified disorders of bone density and structure, other site: Secondary | ICD-10-CM

## 2018-01-20 ENCOUNTER — Encounter: Payer: Self-pay | Admitting: *Deleted

## 2018-01-20 ENCOUNTER — Ambulatory Visit (INDEPENDENT_AMBULATORY_CARE_PROVIDER_SITE_OTHER): Payer: Medicare Other | Admitting: *Deleted

## 2018-01-20 VITALS — BP 125/62 | HR 76 | Ht 69.5 in | Wt 220.0 lb

## 2018-01-20 DIAGNOSIS — Z0000 Encounter for general adult medical examination without abnormal findings: Secondary | ICD-10-CM

## 2018-01-20 NOTE — Patient Instructions (Signed)
  Jake Barnett , Thank you for taking time to come for your Medicare Wellness Visit. I appreciate your ongoing commitment to your health goals. Please review the following plan we discussed and let me know if I can assist you in the future.   These are the goals we discussed: Goals    . Exercise 150 min/wk Moderate Activity       This is a list of the screening recommended for you and due dates:  Health Maintenance  Topic Date Due  . Tetanus Vaccine  02/04/1960  . Flu Shot  03/04/2018  . Pneumonia vaccines (2 of 2 - PCV13) 07/16/2018  . Colon Cancer Screening  05/05/2019   Continue to take ibuprofen for pain and follow up it does not resolve. Rest and exercise as tolerated.

## 2018-01-20 NOTE — Progress Notes (Addendum)
Subjective:   Jake Barnett is a 77 y.o. male who presents for a Medicare Annual Wellness Visit. Jake Barnett lives at home with his wife. He has one adult daughter and two grandchildren.    Review of Systems    Patient reports that his overall health is unchanged compared to last year.  Cardiac Risk Factors include: advanced age (>68men, >61 women);obesity (BMI >30kg/m2);sedentary lifestyle;male gender  Musc: right hip/groin pain. Pain in buttocks improved with steroid and current NSAID use. It has not resolved though and he will f/u if it doesn't.   All other systems negative      Current Medications (verified) No outpatient encounter medications on file as of 01/20/2018.   No facility-administered encounter medications on file as of 01/20/2018.     Allergies (verified) Indomethacin   History: Past Medical History:  Diagnosis Date  . Arthritis   . Cancer (HCC)    hx of basal cell skin cancer   . Cholelithiasis with chronic cholecystitis 12/04/2015  . Chronic kidney disease    kidney stones  . Difficult intubation 08/24/2015  . GERD (gastroesophageal reflux disease)   . H/O hiatal hernia   . History of kidney stones   . Hyperlipidemia   . Left ureteral calculus   . Mild diastolic dysfunction    AYMPTOMATIC  . Palpitations    occasional PAC/PVC.  Marland Kitchen PONV (postoperative nausea and vomiting)   . RBBB    Past Surgical History:  Procedure Laterality Date  . ANTERIOR CERVICAL DECOMP/DISCECTOMY FUSION  2001   C5  -- C7- some limited hyperflexion of neck  . CARDIOVASCULAR STRESS TEST  02-11-2011   DR CRENSHAW   NORMAL / NO ISCHEMIA/ EF 66%  . CHOLECYSTECTOMY N/A 12/04/2015   Procedure: LAPAROSCOPIC CHOLECYSTECTOMY;  Surgeon: Fanny Skates, MD;  Location: [facility];  Service: General;  Laterality: N/A;  . CYSTO/ BILATERAL LASER LITHOTRIPSY WITH STONE EXTRACTIONS  05-26-2011  . CYSTOSCOPY W/ URETERAL STENT PLACEMENT Left 05/13/2013   Procedure: CYSTOSCOPY WITH RETROGRADE  PYELOGRAM/URETERAL STENT PLACEMENT;  Surgeon: Bernestine Amass, MD;  Location: [facility];  Service: Urology;  Laterality: Left;  . CYSTOSCOPY WITH RETROGRADE PYELOGRAM, URETEROSCOPY AND STENT PLACEMENT Left 06/15/2013   Procedure: STENT REMOVAL/CYSTOSCOPY//URETEROSCOPYSTONE EXTRACTION WITH BASKET;  Surgeon: Bernestine Amass, MD;  Location: [facility];  Service: Urology;  Laterality: Left;  . HOLMIUM LASER APPLICATION Left 03/54/6568   Procedure: HOLMIUM LASER APPLICATION;  Surgeon: Bernestine Amass, MD;  Location: [facility];  Service: Urology;  Laterality: Left;  . INGUINAL HERNIA REPAIR Bilateral 08/24/2015   Procedure: LAPAROSCOPIC BILATERAL INGUINAL HERNIA REPAIR ;  Surgeon: Jackolyn Confer, MD;  Location: [facility];  Service: General;  Laterality: Bilateral;  . INSERTION OF MESH Bilateral 08/24/2015   Procedure: WITH INSERTION OF MESH;  Surgeon: Jackolyn Confer, MD;  Location: [facility];  Service: General;  Laterality: Bilateral;  . KNEE ARTHROSCOPY Right 2008  . SHOULDER ARTHROSCOPY Right 2007  . TOTAL KNEE ARTHROPLASTY Left 04/18/2013   Procedure: LEFT TOTAL KNEE ARTHROPLASTY;  Surgeon: Gearlean Alf, MD;  Location: [facility];  Service: Orthopedics;  Laterality: Left;  . TRANSTHORACIC ECHOCARDIOGRAM  08-07-2008   NORMAL LVF/  EF 55-60%  . TYMPANOPLASTY Right 1971   mild hearing loss   Family History  Problem Relation Age of Onset  . Heart failure Mother   . Aortic stenosis Mother   . Heart disease Mother   . Hypertension Father   . Dementia Father   . Arthritis  Sister   . Hypertension Sister   . Healthy Daughter   . Arthritis Daughter    Social History   Socioeconomic History  . Marital status: Married    Spouse name: Not on file  . Number of children: 1  . Years of education: 10  . Highest education level: Some college, no degree  Occupational History  . 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 Occupation: Freight forwarder    Comment: for 13 years  Social  Needs  . Financial resource strain: Not hard at all  . Food insecurity:    Worry: Never true    Inability: Never true  . Transportation needs:    Medical: No    Non-medical: No  Tobacco Use  . Smoking status: Never Smoker  . Smokeless tobacco: Never Used  Substance and Sexual Activity  . Alcohol use: No  . Drug use: No  . Sexual activity: Not Currently  Lifestyle  . Physical activity:    Days per week: 7 days    Minutes per session: 30 min  . Stress: To some extent  Relationships  . Social connections:    Talks on phone: More than three times a week    Gets together: More than three times a week    Attends religious service: More than 4 times per year    Active member of club or organization: Yes    Attends meetings of clubs or organizations: More than 4 times per year    Relationship status: Married  Other Topics Concern  . Not on file  Social History Narrative  . Not on file    Tobacco Use No.  Clinical Intake:     Pain : 0-10 Pain Score: 5  Pain Type: Acute pain Pain Orientation: Right Pain Descriptors / Indicators: Aching Pain Onset: More than a month ago Pain Frequency: Constant Pain Relieving Factors: ibuprofen Effect of Pain on Daily Activities: moderate  Pain Relieving Factors: ibuprofen  Nutritional Status: BMI > 30  Obese Diabetes: No  How often do you need to have someone help you when you read instructions, pamphlets, or other written materials from your doctor or pharmacy?: 1 - Never What is the last grade level you completed in school?: some job related college training     Information entered by :: Chong Sicilian, RN  Activities of Daily Living In your present state of health, do you have any difficulty performing the following activities: 01/20/2018  Hearing? Y  Comment hearing loss in right ear from ruptured ear drum  Vision? N  Comment has yearly eye exams  Difficulty concentrating or making decisions? N  Walking or climbing stairs?  Y  Comment knee and hip pain from arthritis  Dressing or bathing? N  Doing errands, shopping? N  Preparing Food and eating ? N  Using the Toilet? N  In the past six months, have you accidently leaked urine? N  Do you have problems with loss of bowel control? N  Managing your Medications? N  Managing your Finances? N  Housekeeping or managing your Housekeeping? N  Some recent data might be hidden     Diet  Eats at home and out about even. Enjoys a nice breakfast everyday and typically eats out.  Drinks a lot of water and tea.  Exercise Current Exercise Habits: The patient does not participate in regular exercise at present, Exercise limited by: orthopedic condition(s)    Depression Screen PHQ 2/9 Scores  01/20/2018 01/12/2018 12/17/2017 12/10/2017  PHQ - 2 Score 0 0 0 0     Fall Risk Fall Risk  01/20/2018 01/12/2018 12/17/2017 12/10/2017 07/16/2017  Falls in the past year? No No No No No  Comment - - - - -    Safety Is the patient's home free of loose throw rugs in walkways, pet beds, electrical cords, etc?   yes      Grab bars in the bathroom? no      Walkin shower? no      Shower Seat? no      Handrails on the stairs?   yes      Adequate lighting?   yes  Patient Care Team: Janora Norlander, DO as PCP - General (Family Medicine) Lelon Perla, MD as Consulting Physician (Cardiology) Ceasar Mons, MD as Consulting Physician (Urology)  No hospitalizations, ER visits, or surgeries this past year.   Objective:    Today's Vitals   01/20/18 1412 01/20/18 1414  BP: 125/62   Pulse: 76   Weight: 220 lb (99.8 kg)   Height: 5' 9.5" (1.765 m)   PainSc:  5    Body mass index is 32.02 kg/m.  Advanced Directives 01/20/2018 11/29/2015 08/22/2015 06/15/2013 05/19/2013 05/13/2013 04/18/2013  Does Patient Have a Medical Advance Directive? No No No Patient does not have advance directive;Patient would not like information Patient does not have advance  directive;Patient would not like information Patient does not have advance directive;Patient would not like information Patient would not like information;Patient does not have advance directive  Would patient like information on creating a medical advance directive? No - Patient declined No - patient declined information No - patient declined information - - - -  Pre-existing out of facility DNR order (yellow form or pink MOST form) - - - No No - No    Hearing/Vision  normal or No deficits noted during visit.   Cognitive Function: MMSE - Mini Mental State Exam 01/20/2018  Orientation to time 5  Orientation to Place 5  Registration 3  Attention/ Calculation 5  Recall 2  Language- name 2 objects 2  Language- repeat 1  Language- follow 3 step command 3  Language- read & follow direction 1  Write a sentence 1  Copy design 1  Total score 29       Normal Cognitive Function Screening: Yes    Immunizations and Health Maintenance Immunization History  Administered Date(s) Administered  . Influenza, High Dose Seasonal PF 05/27/2016, 06/09/2017  . Influenza,inj,Quad PF,6+ Mos 06/20/2013, 05/22/2015  . Pneumococcal Polysaccharide-23 07/16/2017   Health Maintenance Due  Topic Date Due  . TETANUS/TDAP  02[birth year]04/1960   Health Maintenance  Topic Date Due  . Samul Dada  02/04/1960  . INFLUENZA VACCINE  03/04/2018  . PNA vac Low Risk Adult (2 of 2 - PCV13) 07/16/2018  . COLONOSCOPY  05/05/2019        Assessment:   This is a routine wellness examination for [employer].      Plan:    Goals    . Exercise 150 min/wk Moderate Activity        Health Maintenance Recommendations: Colorectal cancer screening -colonoscopy next year. May not need another if negative. Reports having a TD within past 5 years and a pneumonia vaccine as well. May have been at previous doctor's office.   Additional Screening Recommendations: Lung: Low Dose CT Chest recommended if Age 58-80 years, 30  pack-year currently smoking OR have quit w/in 15years. Patient does  not qualify. Hepatitis C Screening recommended: no  Today's Orders No orders of the defined types were placed in this encounter.   Keep f/u with Janora Norlander, DO and any other specialty appointments you may have Move carefully to avoid falls.  Increase physical activity as tolerated and follow up if pain in hip/groin worsens or persists.  Continue ibuprofen as needed for pain. Ice or heat may be helpful as well. Reading or puzzles are a good way to exercise your brain Stay connected with friends and family. Social connections are beneficial to your emotional and mental health.   I have personally reviewed and noted the following in the patient's chart:   . Medical and social history . Use of alcohol, tobacco or illicit drugs  . Current medications and supplements . Functional ability and status . Nutritional status . Physical activity . Advanced directives . List of other physicians . Hospitalizations, surgeries, and ER visits in previous 12 months . Vitals . Screenings to include cognitive, depression, and falls . Referrals and appointments  In addition, I have reviewed and discussed with patient certain preventive protocols, quality metrics, and best practice recommendations. A written personalized care plan for preventive services as well as general preventive health recommendations were provided to patient.     Chong Sicilian, RN   01/20/2018

## 2018-02-18 DIAGNOSIS — N200 Calculus of kidney: Secondary | ICD-10-CM | POA: Diagnosis not present

## 2018-02-18 DIAGNOSIS — N528 Other male erectile dysfunction: Secondary | ICD-10-CM | POA: Diagnosis not present

## 2018-03-01 DIAGNOSIS — M545 Low back pain: Secondary | ICD-10-CM | POA: Diagnosis not present

## 2018-03-10 NOTE — Progress Notes (Signed)
HPI: FU palpitations and chest pain. Previous monitor in 2009 showed sinus with occasional PVC and PAC and short nonsustained run of atrial tachycardia (4 beats). Stress echocardiogram in May of 3790 showed diastolic dysfunction but no ECG changes and no wall motion abnormalities. Patient had a Myoview in July of 2012 and showed an ejection fraction of 66% and normal perfusion. Exercise treadmill November 2015 was negative. Since last seen, the patient denies any dyspnea on exertion, orthopnea, PND, pedal edema, palpitations, syncope or chest pain.   Current Outpatient Medications  Medication Sig Dispense Refill  . celecoxib (CELEBREX) 200 MG capsule Take 1 capsule by mouth daily as needed.     No current facility-administered medications for this visit.      Past Medical History:  Diagnosis Date  . Arthritis   . Cancer (HCC)    hx of basal cell skin cancer   . Cholelithiasis with chronic cholecystitis 12/04/2015  . Chronic kidney disease    kidney stones  . Difficult intubation 08/24/2015  . GERD (gastroesophageal reflux disease)   . H/O hiatal hernia   . History of kidney stones   . Hyperlipidemia   . Left ureteral calculus   . Mild diastolic dysfunction    AYMPTOMATIC  . Palpitations    occasional PAC/PVC.  Marland Kitchen PONV (postoperative nausea and vomiting)   . RBBB     Past Surgical History:  Procedure Laterality Date  . ANTERIOR CERVICAL DECOMP/DISCECTOMY FUSION  2001   C5  -- C7- some limited hyperflexion of neck  . CARDIOVASCULAR STRESS TEST  02-11-2011   DR Tameisha Covell   NORMAL / NO ISCHEMIA/ EF 66%  . CHOLECYSTECTOMY N/A 12/04/2015   Procedure: LAPAROSCOPIC CHOLECYSTECTOMY;  Surgeon: Fanny Skates, MD;  Location: [facility];  Service: General;  Laterality: N/A;  . CYSTO/ BILATERAL LASER LITHOTRIPSY WITH STONE EXTRACTIONS  05-26-2011  . CYSTOSCOPY W/ URETERAL STENT PLACEMENT Left 05/13/2013   Procedure: CYSTOSCOPY WITH RETROGRADE PYELOGRAM/URETERAL STENT PLACEMENT;   Surgeon: Bernestine Amass, MD;  Location: [facility];  Service: Urology;  Laterality: Left;  . CYSTOSCOPY WITH RETROGRADE PYELOGRAM, URETEROSCOPY AND STENT PLACEMENT Left 06/15/2013   Procedure: STENT REMOVAL/CYSTOSCOPY//URETEROSCOPYSTONE EXTRACTION WITH BASKET;  Surgeon: Bernestine Amass, MD;  Location: [facility];  Service: Urology;  Laterality: Left;  . HOLMIUM LASER APPLICATION Left 24/04/7352   Procedure: HOLMIUM LASER APPLICATION;  Surgeon: Bernestine Amass, MD;  Location: [facility];  Service: Urology;  Laterality: Left;  . INGUINAL HERNIA REPAIR Bilateral 08/24/2015   Procedure: LAPAROSCOPIC BILATERAL INGUINAL HERNIA REPAIR ;  Surgeon: Jackolyn Confer, MD;  Location: [facility];  Service: General;  Laterality: Bilateral;  . INSERTION OF MESH Bilateral 08/24/2015   Procedure: WITH INSERTION OF MESH;  Surgeon: Jackolyn Confer, MD;  Location: [facility];  Service: General;  Laterality: Bilateral;  . KNEE ARTHROSCOPY Right 2008  . SHOULDER ARTHROSCOPY Right 2007  . TOTAL KNEE ARTHROPLASTY Left 04/18/2013   Procedure: LEFT TOTAL KNEE ARTHROPLASTY;  Surgeon: Gearlean Alf, MD;  Location: [facility];  Service: Orthopedics;  Laterality: Left;  . TRANSTHORACIC ECHOCARDIOGRAM  08-07-2008   NORMAL LVF/  EF 55-60%  . TYMPANOPLASTY Right 1971   mild hearing loss    Social History   Socioeconomic History  . Marital status: Married    Spouse name: Not on file  . Number of children: 1  . Years of education: 3  . Highest education level: Some college, no degree  Occupational History  . Occupation: Clinical cytogeneticist  Employer: [employer]    Comment: Retired  . Occupation: Freight forwarder    Comment: for 13 years  Social Needs  . Financial resource strain: Not hard at all  . Food insecurity:    Worry: Never true    Inability: Never true  . Transportation needs:    Medical: No    Non-medical: No  Tobacco Use  . Smoking status: Never Smoker  . Smokeless tobacco: Never Used  Substance and Sexual Activity  .  Alcohol use: No  . Drug use: No  . Sexual activity: Not Currently  Lifestyle  . Physical activity:    Days per week: 7 days    Minutes per session: 30 min  . Stress: To some extent  Relationships  . Social connections:    Talks on phone: More than three times a week    Gets together: More than three times a week    Attends religious service: More than 4 times per year    Active member of club or organization: Yes    Attends meetings of clubs or organizations: More than 4 times per year    Relationship status: Married  . Intimate partner violence:    Fear of current or ex partner: No    Emotionally abused: No    Physically abused: No    Forced sexual activity: No  Other Topics Concern  . Not on file  Social History Narrative  . Not on file    Family History  Problem Relation Age of Onset  . Heart failure Mother   . Aortic stenosis Mother   . Heart disease Mother   . Hypertension Father   . Dementia Father   . Arthritis Sister   . Hypertension Sister   . Healthy Daughter   . Arthritis Daughter     ROS: Reflux and arthralgias but no fevers or chills, productive cough, hemoptysis, dysphasia, odynophagia, melena, hematochezia, dysuria, hematuria, rash, seizure activity, orthopnea, PND, pedal edema, claudication. Remaining systems are negative.  Physical Exam: Well-developed well-nourished in no acute distress.  Skin is warm and dry.  HEENT is normal.  Neck is supple.  Chest is clear to auscultation with normal expansion.  Cardiovascular exam is regular rate and rhythm.  Abdominal exam nontender or distended. No masses palpated. Extremities show no edema. neuro grossly intact  ECG-sinus rhythm at a rate of 65.  No ST changes.  Personally reviewed  A/P  1 history of palpitations-patient has occasional brief flutters but no sustained palpitations.  He does not appear to be particularly bothered by the symptoms.  We will add beta-blockade in the future if his symptoms  worsen.  2 history of chest pain-no recent symptoms.  Previous functional study negative.  3 H/O elevated blood pressure-Normal today. No change in therapy.  Kirk Ruths, MD

## 2018-03-11 DIAGNOSIS — H2513 Age-related nuclear cataract, bilateral: Secondary | ICD-10-CM | POA: Diagnosis not present

## 2018-03-11 DIAGNOSIS — H40033 Anatomical narrow angle, bilateral: Secondary | ICD-10-CM | POA: Diagnosis not present

## 2018-03-12 ENCOUNTER — Ambulatory Visit (INDEPENDENT_AMBULATORY_CARE_PROVIDER_SITE_OTHER): Payer: Medicare Other | Admitting: Cardiology

## 2018-03-12 ENCOUNTER — Encounter: Payer: Self-pay | Admitting: Cardiology

## 2018-03-12 VITALS — BP 128/70 | HR 65 | Ht 71.0 in | Wt 215.4 lb

## 2018-03-12 DIAGNOSIS — R072 Precordial pain: Secondary | ICD-10-CM | POA: Diagnosis not present

## 2018-03-12 DIAGNOSIS — R002 Palpitations: Secondary | ICD-10-CM

## 2018-03-12 NOTE — Patient Instructions (Signed)
Your physician wants you to follow-up in: ONE YEAR WITH DR CRENSHAW You will receive a reminder letter in the mail two months in advance. If you don't receive a letter, please call our office to schedule the follow-up appointment.   If you need a refill on your cardiac medications before your next appointment, please call your pharmacy.  

## 2018-04-12 DIAGNOSIS — M545 Low back pain: Secondary | ICD-10-CM | POA: Diagnosis not present

## 2018-05-19 ENCOUNTER — Ambulatory Visit (INDEPENDENT_AMBULATORY_CARE_PROVIDER_SITE_OTHER): Payer: Medicare Other

## 2018-05-19 DIAGNOSIS — Z23 Encounter for immunization: Secondary | ICD-10-CM

## 2018-08-09 IMAGING — DX DG HIP (WITH OR WITHOUT PELVIS) 2-3V*R*
3 series · 3 of 3 positions shown · non-contrast
Comparison: None.

CLINICAL DATA: Right groin and low back pain

EXAM:
DG HIP (WITH OR WITHOUT PELVIS) 2-3V RIGHT

[pelvis ap]
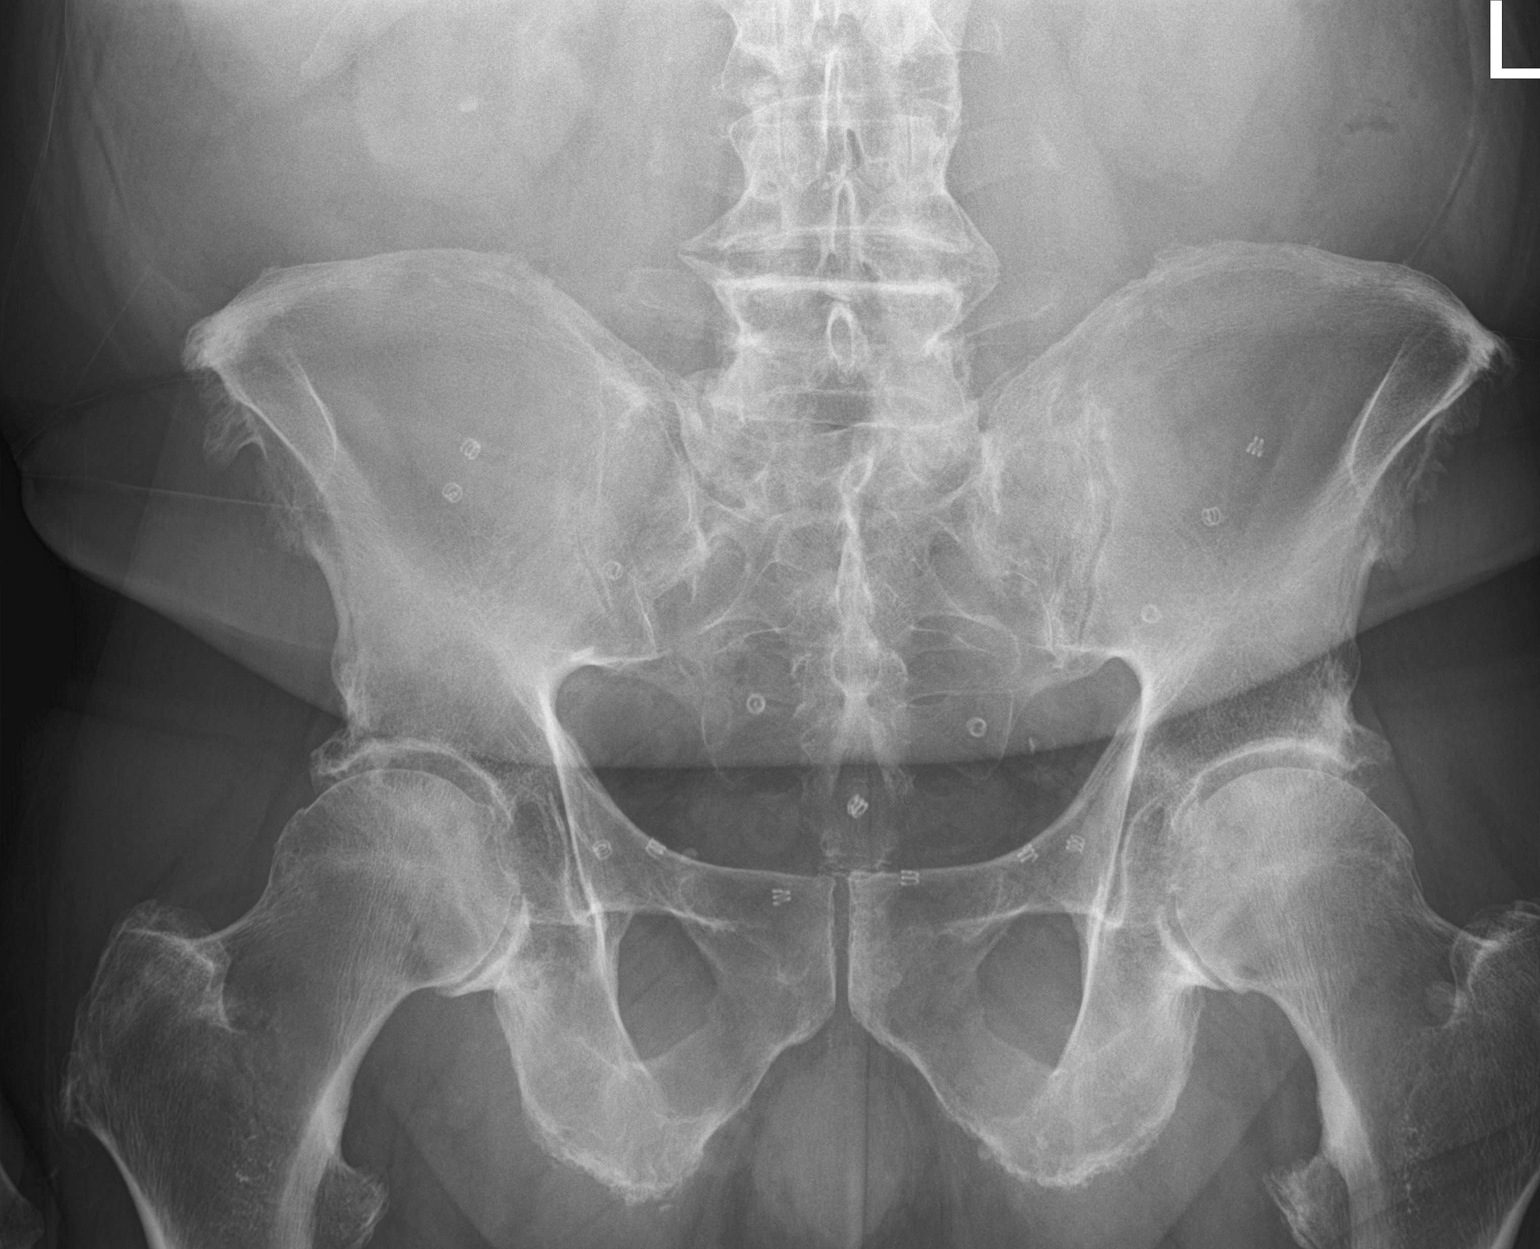

[hip ap]
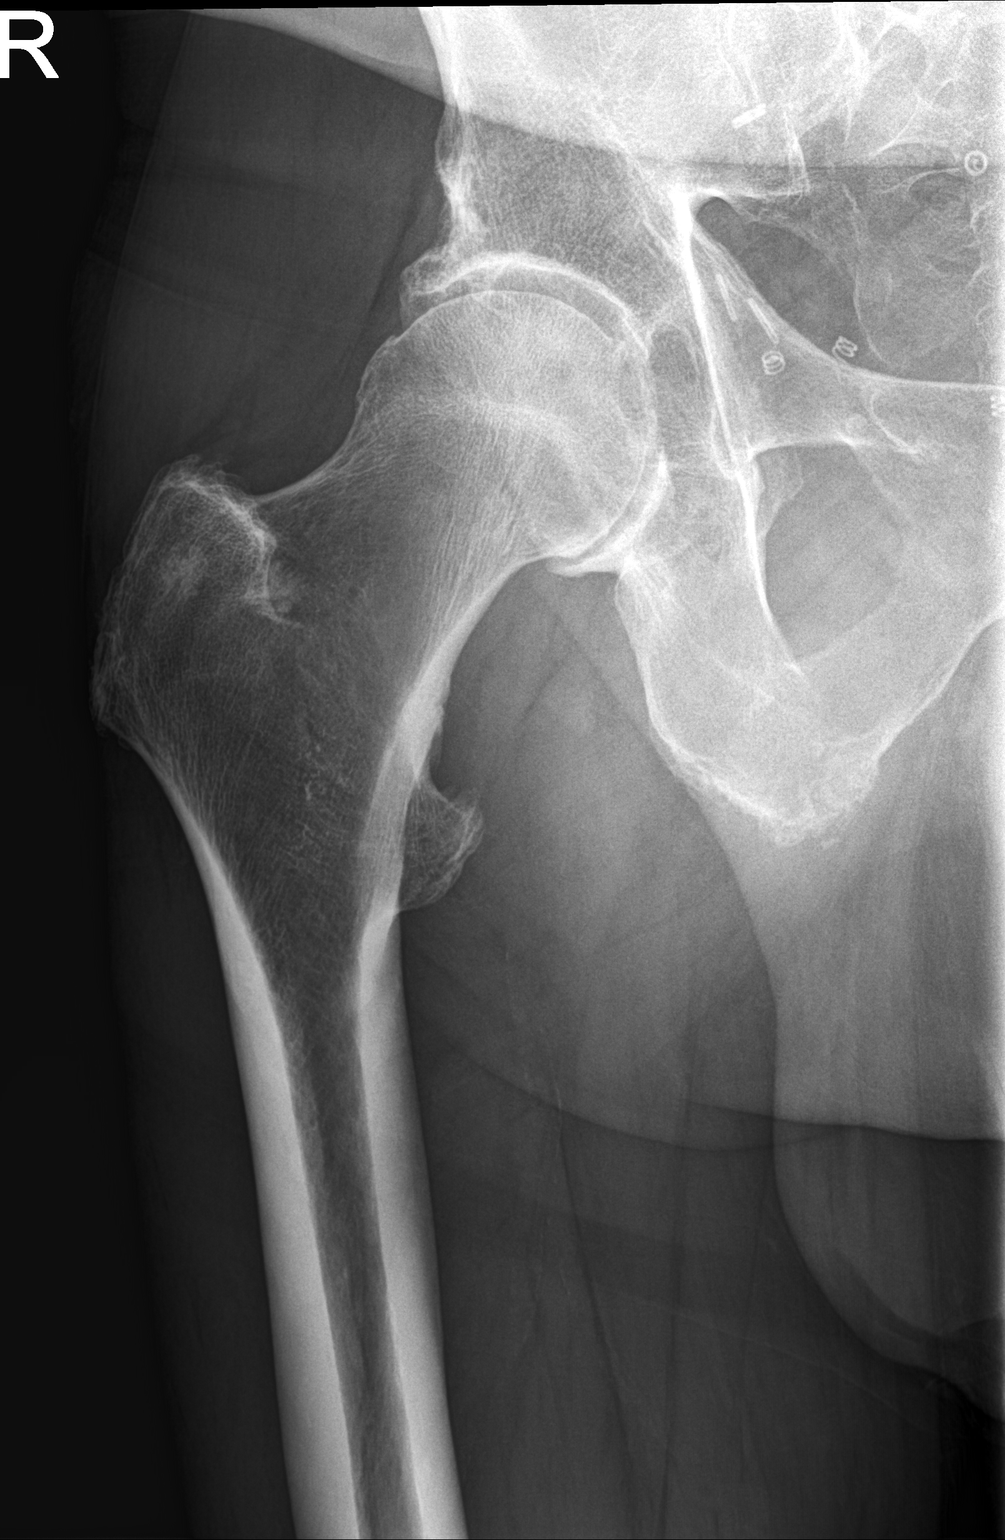

[hip lat]
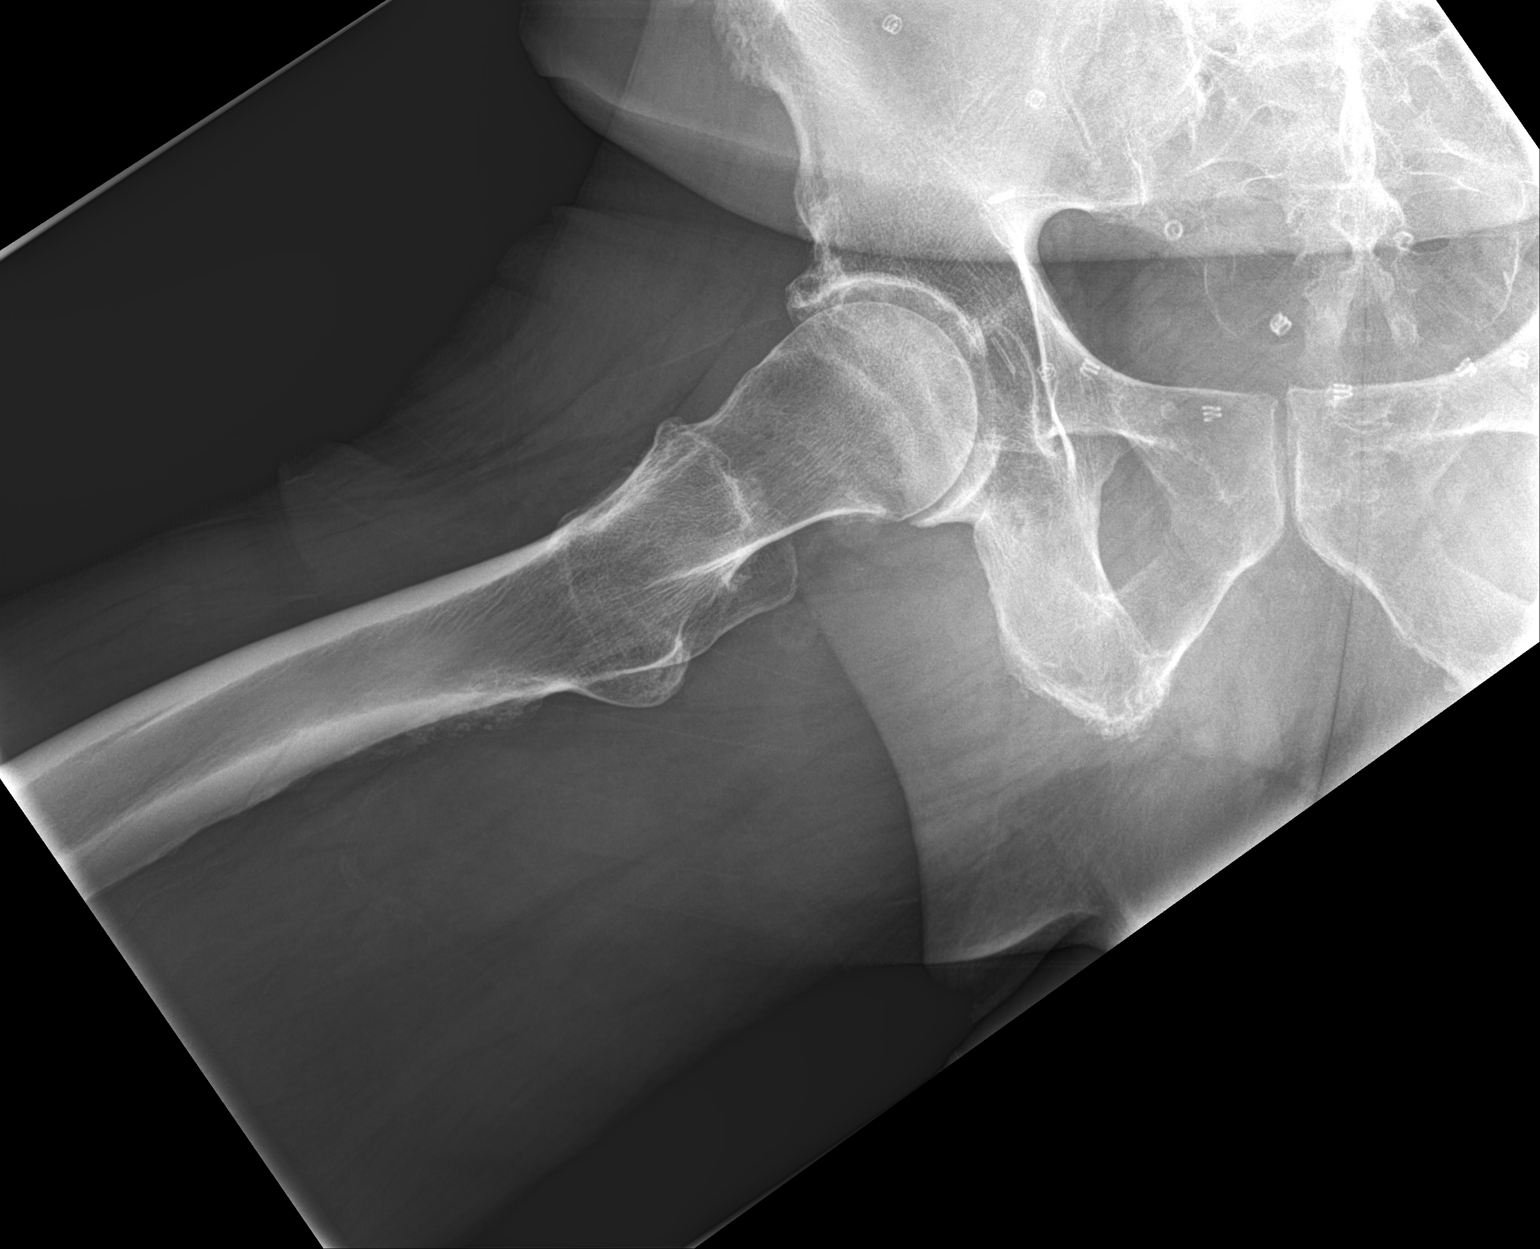

[3 of 3 positions shown; findings below may reference images not displayed]

FINDINGS: Moderate degenerative changes in the hips with joint space narrowing
and spurring, symmetric. SI joints are symmetric and unremarkable.
No acute bony abnormality. Specifically, no fracture, subluxation,
or dislocation.
IMPRESSION: Moderate symmetric degenerative changes in the hips. No acute bony
abnormality.

## 2018-09-03 DIAGNOSIS — N200 Calculus of kidney: Secondary | ICD-10-CM | POA: Diagnosis not present

## 2018-09-03 DIAGNOSIS — N5201 Erectile dysfunction due to arterial insufficiency: Secondary | ICD-10-CM | POA: Diagnosis not present

## 2018-10-15 DIAGNOSIS — C44311 Basal cell carcinoma of skin of nose: Secondary | ICD-10-CM | POA: Diagnosis not present

## 2018-10-15 DIAGNOSIS — L820 Inflamed seborrheic keratosis: Secondary | ICD-10-CM | POA: Diagnosis not present

## 2018-10-15 DIAGNOSIS — L578 Other skin changes due to chronic exposure to nonionizing radiation: Secondary | ICD-10-CM | POA: Diagnosis not present

## 2018-10-15 DIAGNOSIS — B351 Tinea unguium: Secondary | ICD-10-CM | POA: Diagnosis not present

## 2018-10-15 DIAGNOSIS — L821 Other seborrheic keratosis: Secondary | ICD-10-CM | POA: Diagnosis not present

## 2018-10-15 DIAGNOSIS — L570 Actinic keratosis: Secondary | ICD-10-CM | POA: Diagnosis not present

## 2018-10-19 DIAGNOSIS — C44311 Basal cell carcinoma of skin of nose: Secondary | ICD-10-CM | POA: Diagnosis not present

## 2019-03-03 ENCOUNTER — Other Ambulatory Visit: Payer: Self-pay

## 2019-03-16 ENCOUNTER — Encounter: Payer: Self-pay | Admitting: *Deleted

## 2019-05-03 ENCOUNTER — Ambulatory Visit: Payer: Medicare Other | Admitting: Cardiology

## 2019-05-09 NOTE — Progress Notes (Signed)
HPI: FU palpitations and chest pain. Previous monitor in 2009 showed sinus with occasional PVC and PAC and short nonsustained run of atrial tachycardia (4 beats). Stress echocardiogram in May of [id number] showed diastolic dysfunction but no ECG changes and no wall motion abnormalities. Patient had a Myoview in July of 2012 and showed an ejection fraction of 66% and normal perfusion. Exercise treadmill November 2015 was negative. Since last seen,he has occasional chest tightness after eating but there is no exertional chest pain.  No dyspnea, syncope.  Occasional brief palpitations unchanged.  Current Outpatient Medications  Medication Sig Dispense Refill  . fluconazole (DIFLUCAN) 200 MG tablet Take 1 tablet by mouth 2 (two) times a week. As needed    . omeprazole (PRILOSEC) 20 MG capsule Take 20 mg by mouth daily.     No current facility-administered medications for this visit.      Past Medical History:  Diagnosis Date  . Arthritis   . Cancer (HCC)    hx of basal cell skin cancer   . Cholelithiasis with chronic cholecystitis 12/04/2015  . Chronic kidney disease    kidney stones  . Difficult intubation 08/24/2015  . GERD (gastroesophageal reflux disease)   . H/O hiatal hernia   . History of kidney stones   . Hyperlipidemia   . Left ureteral calculus   . Mild diastolic dysfunction    AYMPTOMATIC  . Palpitations    occasional PAC/PVC.  Marland Kitchen PONV (postoperative nausea and vomiting)   . RBBB     Past Surgical History:  Procedure Laterality Date  . ANTERIOR CERVICAL DECOMP/DISCECTOMY FUSION  2001   C5  -- C7- some limited hyperflexion of neck  . CARDIOVASCULAR STRESS TEST  02-11-2011   DR CRENSHAW   NORMAL / NO ISCHEMIA/ EF 66%  . CHOLECYSTECTOMY N/A 12/04/2015   Procedure: LAPAROSCOPIC CHOLECYSTECTOMY;  Surgeon: Fanny Skates, MD;  Location: [facility];  Service: General;  Laterality: N/A;  . CYSTO/ BILATERAL LASER LITHOTRIPSY WITH STONE EXTRACTIONS  05-26-2011  . CYSTOSCOPY W/  URETERAL STENT PLACEMENT Left 05/13/2013   Procedure: CYSTOSCOPY WITH RETROGRADE PYELOGRAM/URETERAL STENT PLACEMENT;  Surgeon: Bernestine Amass, MD;  Location: [facility];  Service: Urology;  Laterality: Left;  . CYSTOSCOPY WITH RETROGRADE PYELOGRAM, URETEROSCOPY AND STENT PLACEMENT Left 06/15/2013   Procedure: STENT REMOVAL/CYSTOSCOPY//URETEROSCOPYSTONE EXTRACTION WITH BASKET;  Surgeon: Bernestine Amass, MD;  Location: [facility];  Service: Urology;  Laterality: Left;  . HOLMIUM LASER APPLICATION Left [id number]   Procedure: HOLMIUM LASER APPLICATION;  Surgeon: Bernestine Amass, MD;  Location: [facility];  Service: Urology;  Laterality: Left;  . INGUINAL HERNIA REPAIR Bilateral 08/24/2015   Procedure: LAPAROSCOPIC BILATERAL INGUINAL HERNIA REPAIR ;  Surgeon: Jackolyn Confer, MD;  Location: [facility];  Service: General;  Laterality: Bilateral;  . INSERTION OF MESH Bilateral 08/24/2015   Procedure: WITH INSERTION OF MESH;  Surgeon: Jackolyn Confer, MD;  Location: [facility];  Service: General;  Laterality: Bilateral;  . KNEE ARTHROSCOPY Right 2008  . SHOULDER ARTHROSCOPY Right 2007  . TOTAL KNEE ARTHROPLASTY Left 04/18/2013   Procedure: LEFT TOTAL KNEE ARTHROPLASTY;  Surgeon: Gearlean Alf, MD;  Location: [facility];  Service: Orthopedics;  Laterality: Left;  . TRANSTHORACIC ECHOCARDIOGRAM  08-07-2008   NORMAL LVF/  EF 55-60%  . TYMPANOPLASTY Right 1971   mild hearing loss    Social History   Socioeconomic History  . Marital status: Married    Spouse name: Not on file  . Number of children: 1  .  Years of education: 22  . Highest education level: Some college, no degree  Occupational History  . 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 Occupation: Freight forwarder    Comment: for 13 years  Social Needs  . Financial resource strain: Not hard at all  . Food insecurity    Worry: Never true    Inability: Never true  . Transportation needs    Medical: No    Non-medical: No   Tobacco Use  . Smoking status: Never Smoker  . Smokeless tobacco: Never Used  Substance and Sexual Activity  . Alcohol use: No  . Drug use: No  . Sexual activity: Not Currently  Lifestyle  . Physical activity    Days per week: 7 days    Minutes per session: 30 min  . Stress: To some extent  Relationships  . Social connections    Talks on phone: More than three times a week    Gets together: More than three times a week    Attends religious service: More than 4 times per year    Active member of club or organization: Yes    Attends meetings of clubs or organizations: More than 4 times per year    Relationship status: Married  . Intimate partner violence    Fear of current or ex partner: No    Emotionally abused: No    Physically abused: No    Forced sexual activity: No  Other Topics Concern  . Not on file  Social History Narrative  . Not on file    Family History  Problem Relation Age of Onset  . Heart failure Mother   . Aortic stenosis Mother   . Heart disease Mother   . Hypertension Father   . Dementia Father   . Arthritis Sister   . Hypertension Sister   . Healthy Daughter   . Arthritis Daughter     ROS: no fevers or chills, productive cough, hemoptysis, dysphasia, odynophagia, melena, hematochezia, dysuria, hematuria, rash, seizure activity, orthopnea, PND, pedal edema, claudication. Remaining systems are negative.  Physical Exam: Well-developed well-nourished in no acute distress.  Skin is warm and dry.  HEENT is normal.  Neck is supple.  Chest is clear to auscultation with normal expansion.  Cardiovascular exam is regular rate and rhythm.  Abdominal exam nontender or distended. No masses palpated. Extremities show no edema. neuro grossly intact  ECG- Sinus with no ST changes; personally reviewed  A/P  1 palpitations-symptoms are reasonably well controlled.  We can consider addition of beta-blockade in the future if his symptoms worsen.  2 history  of chest pain-he has not had any recent episodes.  Previous evaluation negative with functional study.  We will not pursue further ischemia evaluation unless he has worsening symptoms in the future.  3 history of elevated blood pressure-blood pressure is borderline.  However he checks his blood pressure at home routinely and it is typically controlled.  We will continue to follow.  Kirk Ruths, MD

## 2019-05-11 DIAGNOSIS — Z86010 Personal history of colonic polyps: Secondary | ICD-10-CM | POA: Diagnosis not present

## 2019-05-11 DIAGNOSIS — K219 Gastro-esophageal reflux disease without esophagitis: Secondary | ICD-10-CM | POA: Diagnosis not present

## 2019-05-11 DIAGNOSIS — Z87898 Personal history of other specified conditions: Secondary | ICD-10-CM | POA: Diagnosis not present

## 2019-05-17 ENCOUNTER — Telehealth: Payer: Self-pay | Admitting: Cardiology

## 2019-05-17 NOTE — Telephone Encounter (Signed)
Spoke to patient he stated he has appointment with Dr.Crenshaw this Fri 10/16.His daughter tested positive for covid today.He has not been around her for 11 days and on Fri it will be 14 days.He has no symptoms and wanted to make sure it will be safe to keep appointment.Advised I will send Dr.Crenshaw message for advice.

## 2019-05-17 NOTE — Telephone Encounter (Signed)
New message   patient has a question about coming to his appointment and his daughter having covid but he has not been around her for 11 days. Please advise.

## 2019-05-18 NOTE — Telephone Encounter (Signed)
Spoke with pt, Aware of dr crenshaw's recommendations.  °

## 2019-05-18 NOTE — Telephone Encounter (Signed)
If no symptoms on Friday, ok for appt Kirk Ruths

## 2019-05-20 ENCOUNTER — Other Ambulatory Visit: Payer: Self-pay

## 2019-05-20 ENCOUNTER — Ambulatory Visit (INDEPENDENT_AMBULATORY_CARE_PROVIDER_SITE_OTHER): Payer: Medicare Other | Admitting: Cardiology

## 2019-05-20 ENCOUNTER — Encounter: Payer: Self-pay | Admitting: Cardiology

## 2019-05-20 VITALS — BP 140/72 | HR 66 | Temp 97.7°F | Ht 71.0 in | Wt 206.0 lb

## 2019-05-20 DIAGNOSIS — R072 Precordial pain: Secondary | ICD-10-CM | POA: Diagnosis not present

## 2019-05-20 DIAGNOSIS — R002 Palpitations: Secondary | ICD-10-CM | POA: Diagnosis not present

## 2019-05-20 NOTE — Patient Instructions (Signed)
Medication Instructions:  NO CHANGE *If you need a refill on your cardiac medications before your next appointment, please call your pharmacy*  Lab Work: If you have labs (blood work) drawn today and your tests are completely normal, you will receive your results only by: . MyChart Message (if you have MyChart) OR . A paper copy in the mail If you have any lab test that is abnormal or we need to change your treatment, we will call you to review the results.  Follow-Up: At [facility], you and your health needs are our priority.  As part of our continuing mission to provide you with exceptional heart care, we have created designated Provider Care Teams.  These Care Teams include your primary Cardiologist (physician) and Advanced Practice Providers (APPs -  Physician Assistants and Nurse Practitioners) who all work together to provide you with the care you need, when you need it.  Your next appointment:   12 months  The format for your next appointment:   In Person  Provider:   Brian Crenshaw, MD   

## 2019-05-25 ENCOUNTER — Other Ambulatory Visit: Payer: Self-pay

## 2019-05-26 ENCOUNTER — Ambulatory Visit (INDEPENDENT_AMBULATORY_CARE_PROVIDER_SITE_OTHER): Payer: Medicare Other

## 2019-05-26 DIAGNOSIS — Z23 Encounter for immunization: Secondary | ICD-10-CM

## 2019-06-23 DIAGNOSIS — Z1159 Encounter for screening for other viral diseases: Secondary | ICD-10-CM | POA: Diagnosis not present

## 2019-06-29 DIAGNOSIS — D123 Benign neoplasm of transverse colon: Secondary | ICD-10-CM | POA: Diagnosis not present

## 2019-06-29 DIAGNOSIS — Z86010 Personal history of colonic polyps: Secondary | ICD-10-CM | POA: Diagnosis not present

## 2019-06-29 DIAGNOSIS — K640 First degree hemorrhoids: Secondary | ICD-10-CM | POA: Diagnosis not present

## 2019-06-29 DIAGNOSIS — D120 Benign neoplasm of cecum: Secondary | ICD-10-CM | POA: Diagnosis not present

## 2019-07-05 ENCOUNTER — Other Ambulatory Visit: Payer: Self-pay

## 2019-07-05 DIAGNOSIS — D123 Benign neoplasm of transverse colon: Secondary | ICD-10-CM | POA: Diagnosis not present

## 2019-07-05 DIAGNOSIS — D120 Benign neoplasm of cecum: Secondary | ICD-10-CM | POA: Diagnosis not present

## 2019-07-06 ENCOUNTER — Ambulatory Visit (INDEPENDENT_AMBULATORY_CARE_PROVIDER_SITE_OTHER): Payer: Medicare Other

## 2019-07-06 DIAGNOSIS — Z23 Encounter for immunization: Secondary | ICD-10-CM

## 2019-08-22 DIAGNOSIS — H5203 Hypermetropia, bilateral: Secondary | ICD-10-CM | POA: Diagnosis not present

## 2019-09-12 ENCOUNTER — Ambulatory Visit: Payer: Medicare Other

## 2019-10-12 DIAGNOSIS — H1045 Other chronic allergic conjunctivitis: Secondary | ICD-10-CM | POA: Diagnosis not present

## 2019-10-12 DIAGNOSIS — H02831 Dermatochalasis of right upper eyelid: Secondary | ICD-10-CM | POA: Diagnosis not present

## 2019-10-12 DIAGNOSIS — H0288A Meibomian gland dysfunction right eye, upper and lower eyelids: Secondary | ICD-10-CM | POA: Diagnosis not present

## 2019-10-12 DIAGNOSIS — H04123 Dry eye syndrome of bilateral lacrimal glands: Secondary | ICD-10-CM | POA: Diagnosis not present

## 2019-10-12 DIAGNOSIS — H0288B Meibomian gland dysfunction left eye, upper and lower eyelids: Secondary | ICD-10-CM | POA: Diagnosis not present

## 2019-10-12 DIAGNOSIS — H02834 Dermatochalasis of left upper eyelid: Secondary | ICD-10-CM | POA: Diagnosis not present

## 2019-10-12 DIAGNOSIS — H2513 Age-related nuclear cataract, bilateral: Secondary | ICD-10-CM | POA: Diagnosis not present

## 2019-10-25 DIAGNOSIS — L570 Actinic keratosis: Secondary | ICD-10-CM | POA: Diagnosis not present

## 2019-10-25 DIAGNOSIS — B351 Tinea unguium: Secondary | ICD-10-CM | POA: Diagnosis not present

## 2019-10-25 DIAGNOSIS — C44311 Basal cell carcinoma of skin of nose: Secondary | ICD-10-CM | POA: Diagnosis not present

## 2019-10-25 DIAGNOSIS — L578 Other skin changes due to chronic exposure to nonionizing radiation: Secondary | ICD-10-CM | POA: Diagnosis not present

## 2019-10-25 DIAGNOSIS — L821 Other seborrheic keratosis: Secondary | ICD-10-CM | POA: Diagnosis not present

## 2019-11-14 ENCOUNTER — Encounter: Payer: Self-pay | Admitting: Family Medicine

## 2019-11-14 DIAGNOSIS — N200 Calculus of kidney: Secondary | ICD-10-CM | POA: Diagnosis not present

## 2019-11-14 DIAGNOSIS — N430 Encysted hydrocele: Secondary | ICD-10-CM | POA: Diagnosis not present

## 2019-11-14 DIAGNOSIS — Z125 Encounter for screening for malignant neoplasm of prostate: Secondary | ICD-10-CM | POA: Diagnosis not present

## 2020-01-24 ENCOUNTER — Encounter: Payer: Self-pay | Admitting: Family Medicine

## 2020-01-24 ENCOUNTER — Other Ambulatory Visit: Payer: Self-pay

## 2020-01-24 ENCOUNTER — Ambulatory Visit (INDEPENDENT_AMBULATORY_CARE_PROVIDER_SITE_OTHER): Payer: Medicare Other | Admitting: Family Medicine

## 2020-01-24 VITALS — BP 118/63 | HR 78 | Temp 97.8°F | Ht 71.0 in | Wt 205.2 lb

## 2020-01-24 DIAGNOSIS — K219 Gastro-esophageal reflux disease without esophagitis: Secondary | ICD-10-CM

## 2020-01-24 DIAGNOSIS — Z87442 Personal history of urinary calculi: Secondary | ICD-10-CM | POA: Insufficient documentation

## 2020-01-24 DIAGNOSIS — R030 Elevated blood-pressure reading, without diagnosis of hypertension: Secondary | ICD-10-CM | POA: Diagnosis not present

## 2020-01-24 DIAGNOSIS — E7800 Pure hypercholesterolemia, unspecified: Secondary | ICD-10-CM

## 2020-01-24 DIAGNOSIS — Z7689 Persons encountering health services in other specified circumstances: Secondary | ICD-10-CM

## 2020-01-24 DIAGNOSIS — R002 Palpitations: Secondary | ICD-10-CM | POA: Diagnosis not present

## 2020-01-24 MED ORDER — FAMOTIDINE 20 MG PO TABS: 20.0000 mg | ORAL_TABLET | Freq: Every day | ORAL | 3 refills | Status: DC

## 2020-01-24 NOTE — Patient Instructions (Signed)

## 2020-01-24 NOTE — Progress Notes (Signed)
Subjective: CC: Establish care, GERD PCP: Janora Norlander, DO BOF:BPZWC Jake Barnett is a 79 y.o. male presenting to clinic today for:  1.  GERD Patient reports somewhat uncontrolled GERD.  He takes Prilosec 20 mg daily, previously on Nexium.  He notes that while it does help some it is not especially well controlled.  He tried going up to 40 mg of Prilosec previously but this again did not have much impact on his acid reflux symptoms.  He has been evaluated by GI, who actually placed him on the PPI.  He had EGD done.  No history of NSAID use in excess, GI bleed.  No nausea, vomiting.  He notes that symptoms seem to be worse in the morning time.  He switched his Prilosec to evening time hoping that this would kick in by morning but unfortunately he continues to have acid reflux symptoms in the morning.  To his knowledge he has never been on Pepcid but he was previously on Zantac which he actually did feel helped his acid reflux symptoms.  This of course was discontinued after it was pulled from the market.  2.  Heart palpitations Patient is followed by Dr. Stanford Breed for heart palpitations.  He has had work-up but no significant or apparent etiology of intermittent heart palpitations.  Generally the heart palpitations are not severe.  He is not currently on any medications for this.   3.  History of renal stone Patient is followed by urology for renal stones.  He had a PSA obtained with their office recently and this was normal per his report.  Denies any urinary symptoms.  ROS: Per HPI  Allergies  Allergen Reactions  . Indomethacin Nausea Only and Other (See Comments)    Stomach ache  . Indomethacin    Past Medical History:  Diagnosis Date  . Arthritis   . Cancer (HCC)    hx of basal cell skin cancer   . Cholelithiasis with chronic cholecystitis 12/04/2015  . Chronic kidney disease    kidney stones  . Difficult intubation 08/24/2015  . GERD (gastroesophageal reflux disease)   . H/O  hiatal hernia   . History of kidney stones   . Hyperlipidemia   . Left ureteral calculus   . Mild diastolic dysfunction    AYMPTOMATIC  . Palpitations    occasional PAC/PVC.  Marland Kitchen PONV (postoperative nausea and vomiting)   . RBBB     Current Outpatient Medications:  .  fluconazole (DIFLUCAN) 200 MG tablet, Take 1 tablet by mouth 2 (two) times a week. As needed, Disp: , Rfl:  .  omeprazole (PRILOSEC) 20 MG capsule, Take 20 mg by mouth daily., Disp: , Rfl:  Social History   Socioeconomic History  . Marital status: Married    Spouse name: Not on file  . Number of children: 1  . Years of education: 19  . Highest education level: Some college, no degree  Occupational History  . 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 Occupation: Mailman    Comment: for 13 years  Tobacco Use  . Smoking status: Never Smoker  . Smokeless tobacco: Never Used  Vaping Use  . Vaping Use: Never used  Substance and Sexual Activity  . Alcohol use: No  . Drug use: No  . Sexual activity: Not Currently  Other Topics Concern  . Not on file  Social History Narrative  . Not on file   Social Determinants of Health  Financial Resource Strain:   . Difficulty of Paying Living Expenses:   Food Insecurity:   . Worried About Charity fundraiser in the Last Year:   . Arboriculturist in the Last Year:   Transportation Needs:   . Film/video editor (Medical):   Marland Kitchen Lack of Transportation (Non-Medical):   Physical Activity:   . Days of Exercise per Week:   . Minutes of Exercise per Session:   Stress:   . Feeling of Stress :   Social Connections:   . Frequency of Communication with Friends and Family:   . Frequency of Social Gatherings with Friends and Family:   . Attends Religious Services:   . Active Member of Clubs or Organizations:   . Attends Archivist Meetings:   Marland Kitchen Marital Status:   Intimate Partner Violence:   . Fear of Current or Ex-Partner:   .  Emotionally Abused:   Marland Kitchen Physically Abused:   . Sexually Abused:    Family History  Problem Relation Age of Onset  . Heart failure Mother   . Aortic stenosis Mother   . Heart disease Mother   . Hypertension Father   . Dementia Father   . Arthritis Sister   . Hypertension Sister   . Healthy Daughter   . Arthritis Daughter     Objective: Office vital signs reviewed. BP 118/63   Pulse 78   Temp 97.8 F (36.6 C)   Ht 5' 11"  (1.803 m)   Wt 205 lb 3.2 oz (93.1 kg)   SpO2 98%   BMI 28.62 kg/m   Physical Examination:  General: Awake, alert, well nourished, No acute distress HEENT: Normal; sclera right.  Moist mucous membranes Cardio: regular rate and rhythm, S1S2 heard, no murmurs appreciated Pulm: clear to auscultation bilaterally, no wheezes, rhonchi or rales; normal work of breathing on room air GI: Slight protuberant Extremities: warm, well perfused, No edema, cyanosis or clubbing; +2 pulses bilaterally  Assessment/ Plan: 79 y.o. male   1. Gastroesophageal reflux disease without esophagitis Add Pepcid 20 mg every morning.  Okay to continue PPI.  Follow-up as needed  2. Heart palpitations We will check labs.  Has not had these done in quite some time.  He seems to have been cleared by cardiology. - CMP14+EGFR; Future - TSH; Future - CBC with Differential - TSH - CMP14+EGFR  3. Establishing care with new doctor, encounter for  4. Elevated blood pressure reading in office without diagnosis of hypertension Blood pressure was in normal range today.  We will check kidney function - CMP14+EGFR; Future - CMP14+EGFR  5. Pure hypercholesterolemia Plan for lipid panel.  Patient is only 6 hours from his last meal - CMP14+EGFR; Future - Lipid panel; Future - TSH; Future - TSH - Lipid panel - CMP14+EGFR  6. History of renal calculi   No orders of the defined types were placed in this encounter.  No orders of the defined types were placed in this  encounter.    Janora Norlander, DO [place] [phone]

## 2020-01-25 LAB — CMP14+EGFR
ALT: 6 IU/L (ref 0–44)
AST: 17 IU/L (ref 0–40)
Albumin/Globulin Ratio: 1.7 (ref 1.2–2.2)
Albumin: 4 g/dL (ref 3.7–4.7)
Alkaline Phosphatase: 94 IU/L (ref 48–121)
BUN/Creatinine Ratio: 14 (ref 10–24)
BUN: 16 mg/dL (ref 8–27)
Bilirubin Total: 1.3 mg/dL — ABNORMAL HIGH (ref 0.0–1.2)
CO2: 23 mmol/L (ref 20–29)
Calcium: 9.1 mg/dL (ref 8.6–10.2)
Chloride: 105 mmol/L (ref 96–106)
Creatinine, Ser: 1.11 mg/dL (ref 0.76–1.27)
GFR calc Af Amer: 74 mL/min/{1.73_m2} (ref >59)
GFR calc non Af Amer: 64 mL/min/{1.73_m2} (ref >59)
Globulin, Total: 2.4 g/dL (ref 1.5–4.5)
Glucose: 82 mg/dL (ref 65–99)
Potassium: 4.1 mmol/L (ref 3.5–5.2)
Sodium: 142 mmol/L (ref 134–144)
Total Protein: 6.4 g/dL (ref 6.0–8.5)

## 2020-01-25 LAB — CBC WITH DIFFERENTIAL/PLATELET
Basophils Absolute: 0 10*3/uL (ref 0.0–0.2)
Basos: 0 %
EOS (ABSOLUTE): 0.1 10*3/uL (ref 0.0–0.4)
Eos: 1 %
Hematocrit: 43.4 % (ref 37.5–51.0)
Hemoglobin: 15.2 g/dL (ref 13.0–17.7)
Immature Grans (Abs): 0 10*3/uL (ref 0.0–0.1)
Immature Granulocytes: 0 %
Lymphocytes Absolute: 1.8 10*3/uL (ref 0.7–3.1)
Lymphs: 19 %
MCH: 29 pg (ref 26.6–33.0)
MCHC: 35 g/dL (ref 31.5–35.7)
MCV: 83 fL (ref 79–97)
Monocytes Absolute: 0.7 10*3/uL (ref 0.1–0.9)
Monocytes: 7 %
Neutrophils Absolute: 6.6 10*3/uL (ref 1.4–7.0)
Neutrophils: 73 %
Platelets: 238 10*3/uL (ref 150–450)
RBC: 5.25 x10E6/uL (ref 4.14–5.80)
RDW: 13.1 % (ref 11.6–15.4)
WBC: 9.2 10*3/uL (ref 3.4–10.8)

## 2020-01-25 LAB — LIPID PANEL
Chol/HDL Ratio: 4 ratio (ref 0.0–5.0)
Cholesterol, Total: 182 mg/dL (ref 100–199)
HDL: 46 mg/dL (ref >39)
LDL Chol Calc (NIH): 115 mg/dL — ABNORMAL HIGH (ref 0–99)
Triglycerides: 119 mg/dL (ref 0–149)
VLDL Cholesterol Cal: 21 mg/dL (ref 5–40)

## 2020-01-25 LAB — TSH: TSH: 1.37 u[IU]/mL (ref 0.450–4.500)

## 2020-04-28 DIAGNOSIS — Z23 Encounter for immunization: Secondary | ICD-10-CM | POA: Diagnosis not present

## 2020-05-30 NOTE — Progress Notes (Signed)
HPI: FU palpitations and chest pain. Previous monitor in 2009 showed sinus with occasional PVC and PAC and short nonsustained run of atrial tachycardia (4 beats). Stress echocardiogram in May of 7169 showed diastolic dysfunction but no ECG changes and no wall motion abnormalities. Patient had a Myoview in July of 2012 and showed an ejection fraction of 66% and normal perfusion. Exercise treadmill November 2015 was negative. Since last seen,dyspnea, chest pain, palpitations or syncope.  He has some fatigue.  Current Outpatient Medications  Medication Sig Dispense Refill  . cholecalciferol (VITAMIN D3) 25 MCG (1000 UNIT) tablet Take 1,000 Units by mouth daily.    . famotidine (PEPCID) 20 MG tablet Take 1 tablet (20 mg total) by mouth daily. 90 tablet 3  . omeprazole (PRILOSEC) 20 MG capsule Take 20 mg by mouth daily.     No current facility-administered medications for this visit.     Past Medical History:  Diagnosis Date  . Arthritis   . Cancer (HCC)    hx of basal cell skin cancer   . Cholelithiasis with chronic cholecystitis 12/04/2015  . Chronic kidney disease    kidney stones  . Difficult intubation 08/24/2015  . GERD (gastroesophageal reflux disease)   . H/O hiatal hernia   . History of kidney stones   . Hyperlipidemia   . Left ureteral calculus   . Mild diastolic dysfunction    AYMPTOMATIC  . Palpitations    occasional PAC/PVC.  Marland Kitchen PONV (postoperative nausea and vomiting)   . RBBB     Past Surgical History:  Procedure Laterality Date  . ANTERIOR CERVICAL DECOMP/DISCECTOMY FUSION  2001   C5  -- C7- some limited hyperflexion of neck  . CARDIOVASCULAR STRESS TEST  02-11-2011   DR Mila Pair   NORMAL / NO ISCHEMIA/ EF 66%  . CHOLECYSTECTOMY N/A 12/04/2015   Procedure: LAPAROSCOPIC CHOLECYSTECTOMY;  Surgeon: Fanny Skates, MD;  Location: [facility];  Service: General;  Laterality: N/A;  . CYSTO/ BILATERAL LASER LITHOTRIPSY WITH STONE EXTRACTIONS  05-26-2011  . CYSTOSCOPY  W/ URETERAL STENT PLACEMENT Left 05/13/2013   Procedure: CYSTOSCOPY WITH RETROGRADE PYELOGRAM/URETERAL STENT PLACEMENT;  Surgeon: Bernestine Amass, MD;  Location: [facility];  Service: Urology;  Laterality: Left;  . CYSTOSCOPY WITH RETROGRADE PYELOGRAM, URETEROSCOPY AND STENT PLACEMENT Left 06/15/2013   Procedure: STENT REMOVAL/CYSTOSCOPY//URETEROSCOPYSTONE EXTRACTION WITH BASKET;  Surgeon: Bernestine Amass, MD;  Location: [facility];  Service: Urology;  Laterality: Left;  . HOLMIUM LASER APPLICATION Left 67/89/3810   Procedure: HOLMIUM LASER APPLICATION;  Surgeon: Bernestine Amass, MD;  Location: [facility];  Service: Urology;  Laterality: Left;  . INGUINAL HERNIA REPAIR Bilateral 08/24/2015   Procedure: LAPAROSCOPIC BILATERAL INGUINAL HERNIA REPAIR ;  Surgeon: Jackolyn Confer, MD;  Location: [facility];  Service: General;  Laterality: Bilateral;  . INSERTION OF MESH Bilateral 08/24/2015   Procedure: WITH INSERTION OF MESH;  Surgeon: Jackolyn Confer, MD;  Location: [facility];  Service: General;  Laterality: Bilateral;  . KNEE ARTHROSCOPY Right 2008  . SHOULDER ARTHROSCOPY Right 2007  . TOTAL KNEE ARTHROPLASTY Left 04/18/2013   Procedure: LEFT TOTAL KNEE ARTHROPLASTY;  Surgeon: Gearlean Alf, MD;  Location: [facility];  Service: Orthopedics;  Laterality: Left;  . TRANSTHORACIC ECHOCARDIOGRAM  08-07-2008   NORMAL LVF/  EF 55-60%  . TYMPANOPLASTY Right 1971   mild hearing loss    Social History   Socioeconomic History  . Marital status: Married    Spouse name: Not on file  . Number  of children: 1  . Years of education: 40  . Highest education level: Some college, no degree  Occupational History  . 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 Occupation: Mailman    Comment: for 13 years  Tobacco Use  . Smoking status: Never Smoker  . Smokeless tobacco: Never Used  Vaping Use  . Vaping Use: Never used  Substance and Sexual Activity  . Alcohol use: No  . Drug use: No   . Sexual activity: Not Currently  Other Topics Concern  . Not on file  Social History Narrative  . Not on file   Social Determinants of Health   Financial Resource Strain:   . Difficulty of Paying Living Expenses: Not on file  Food Insecurity:   . Worried About Charity fundraiser in the Last Year: Not on file  . Ran Out of Food in the Last Year: Not on file  Transportation Needs:   . Lack of Transportation (Medical): Not on file  . Lack of Transportation (Non-Medical): Not on file  Physical Activity:   . Days of Exercise per Week: Not on file  . Minutes of Exercise per Session: Not on file  Stress:   . Feeling of Stress : Not on file  Social Connections:   . Frequency of Communication with Friends and Family: Not on file  . Frequency of Social Gatherings with Friends and Family: Not on file  . Attends Religious Services: Not on file  . Active Member of Clubs or Organizations: Not on file  . Attends Archivist Meetings: Not on file  . Marital Status: Not on file  Intimate Partner Violence:   . Fear of Current or Ex-Partner: Not on file  . Emotionally Abused: Not on file  . Physically Abused: Not on file  . Sexually Abused: Not on file    Family History  Problem Relation Age of Onset  . Heart failure Mother   . Aortic stenosis Mother   . Heart disease Mother   . Hypertension Father   . Dementia Father   . Arthritis Sister   . Hypertension Sister   . Healthy Daughter   . Arthritis Daughter     ROS: Fatigue but no fevers or chills, productive cough, hemoptysis, dysphasia, odynophagia, melena, hematochezia, dysuria, hematuria, rash, seizure activity, orthopnea, PND, pedal edema, claudication. Remaining systems are negative.  Physical Exam: Well-developed well-nourished in no acute distress.  Skin is warm and dry.  HEENT is normal.  Neck is supple.  Chest is clear to auscultation with normal expansion.  Cardiovascular exam is regular rate and rhythm.   Abdominal exam nontender or distended. No masses palpated. Extremities show no edema. neuro grossly intact  ECG-normal sinus rhythm at a rate of 62, no ST changes.  Personally reviewed  A/P  1 palpitations-symptoms are controlled at present.  We will add a beta-blocker in the future if needed.  2 history of chest pain-no recurrent symptoms.  Previous functional study negative.  3 elevated blood pressure-patient has had mildly elevated pressure in the past.  However he follows his blood pressure at home and his systolic is typically 161-096.  We will continue to follow.  4 hyperlipidemia-mild hyperlipidemia noted on recent lipid panel with LDL 115.  He does not have documented vascular disease.  We will treat with diet.  Kirk Ruths, MD

## 2020-06-05 ENCOUNTER — Ambulatory Visit (INDEPENDENT_AMBULATORY_CARE_PROVIDER_SITE_OTHER): Payer: Medicare Other | Admitting: Cardiology

## 2020-06-05 ENCOUNTER — Encounter: Payer: Self-pay | Admitting: Cardiology

## 2020-06-05 ENCOUNTER — Other Ambulatory Visit: Payer: Self-pay

## 2020-06-05 ENCOUNTER — Ambulatory Visit (INDEPENDENT_AMBULATORY_CARE_PROVIDER_SITE_OTHER): Payer: Medicare Other

## 2020-06-05 VITALS — BP 140/70 | HR 62 | Temp 97.3°F | Ht 70.0 in | Wt 209.6 lb

## 2020-06-05 DIAGNOSIS — Z23 Encounter for immunization: Secondary | ICD-10-CM

## 2020-06-05 DIAGNOSIS — R072 Precordial pain: Secondary | ICD-10-CM | POA: Diagnosis not present

## 2020-06-05 DIAGNOSIS — E7800 Pure hypercholesterolemia, unspecified: Secondary | ICD-10-CM | POA: Diagnosis not present

## 2020-06-05 DIAGNOSIS — R002 Palpitations: Secondary | ICD-10-CM | POA: Diagnosis not present

## 2020-06-05 NOTE — Patient Instructions (Signed)

## 2020-10-12 DIAGNOSIS — M5459 Other low back pain: Secondary | ICD-10-CM | POA: Diagnosis not present

## 2020-10-25 DIAGNOSIS — B351 Tinea unguium: Secondary | ICD-10-CM | POA: Diagnosis not present

## 2020-10-25 DIAGNOSIS — L821 Other seborrheic keratosis: Secondary | ICD-10-CM | POA: Diagnosis not present

## 2020-10-25 DIAGNOSIS — L570 Actinic keratosis: Secondary | ICD-10-CM | POA: Diagnosis not present

## 2020-10-25 DIAGNOSIS — L578 Other skin changes due to chronic exposure to nonionizing radiation: Secondary | ICD-10-CM | POA: Diagnosis not present

## 2020-12-20 DIAGNOSIS — N200 Calculus of kidney: Secondary | ICD-10-CM | POA: Diagnosis not present

## 2021-01-21 ENCOUNTER — Telehealth: Payer: Self-pay | Admitting: Cardiology

## 2021-01-21 NOTE — Telephone Encounter (Signed)
Spoke with patient of Dr. Stanford Breed who is concerned about low BP and dizziness He has noticed lower BP for about 2 weeks Dizziness comes and goes with the low BP  Readings from this afternoon: 106/60 114/60 119/58  With activity, BP increases to 130s  He was at the beach last [place] and had no issues  He said he has been told he has extra beats - last 2 ECGs normal  Confirmed med list is up to date  Advised will notify MD of his concerns and will follow up with advice

## 2021-01-21 NOTE — Telephone Encounter (Signed)
Pt c/o BP issue: STAT if pt c/o blurred vision, one-sided weakness or slurred speech  1. What are your last 5 BP readings?  01/21/21: 106/60       114/60       119/58 2. Are you having any other symptoms (ex. Dizziness, headache, blurred vision, passed out)? Dizziness   3. What is your BP issue?   Patient states for the past 2 weeks his BP has been lower than usual and he's also been having sporadic episodes of dizziness. He states his BP is normally around 135/70.  STAT if patient feels like he/she is going to faint   Are you dizzy now?  Not currently  Do you feel faint or have you passed out? No   Do you have any other symptoms?  No   Have you checked your HR and BP (record if available)?  01/21/21: 106/60       114/60       119/58

## 2021-01-21 NOTE — Telephone Encounter (Signed)
Spoke with pt, Aware of dr crenshaw's recommendations.  °

## 2021-01-31 ENCOUNTER — Telehealth: Payer: Self-pay | Admitting: Family Medicine

## 2021-01-31 NOTE — Telephone Encounter (Signed)
Pt called - coming in tomorrow am - no covid s/s - c/o dizziness and occ. SOB - wants check up and maybe labs

## 2021-02-01 ENCOUNTER — Ambulatory Visit (INDEPENDENT_AMBULATORY_CARE_PROVIDER_SITE_OTHER): Payer: Medicare Other | Admitting: Family

## 2021-02-01 ENCOUNTER — Encounter: Payer: Self-pay | Admitting: Family

## 2021-02-01 ENCOUNTER — Other Ambulatory Visit: Payer: Self-pay

## 2021-02-01 VITALS — Temp 98.5°F | Wt 205.8 lb

## 2021-02-01 DIAGNOSIS — R002 Palpitations: Secondary | ICD-10-CM | POA: Diagnosis not present

## 2021-02-01 DIAGNOSIS — R42 Dizziness and giddiness: Secondary | ICD-10-CM | POA: Diagnosis not present

## 2021-02-01 NOTE — Progress Notes (Signed)
Subjective:    Patient ID: Jake Barnett, male    DOB: 02/01/1941, 80 y.o.   MRN: [id number]  Chief Complaint  Patient presents with   Dizziness    Over a year comes and goes    Pt presents to the office today for palpitations and dizziness. He is followed by Cardiologists and has a follow up in 11/22.  Dizziness This is a new problem. The current episode started more than 1 month ago. The problem occurs intermittently. The problem has been waxing and waning. Pertinent negatives include no coughing.  Palpitations  This is a new problem. The current episode started more than 1 month ago. The problem occurs intermittently. Nothing aggravates the symptoms. Associated symptoms include dizziness, an irregular heartbeat and shortness of breath. Pertinent negatives include no coughing or malaise/fatigue. He has tried nothing for the symptoms. The treatment provided no relief.     Review of Systems  Constitutional:  Negative for malaise/fatigue.  Respiratory:  Positive for shortness of breath. Negative for cough.   Cardiovascular:  Positive for palpitations.  Neurological:  Positive for dizziness.  All other systems reviewed and are negative.     Objective:   Physical Exam Vitals reviewed.  Constitutional:      General: He is not in acute distress.    Appearance: He is well-developed.  HENT:     Head: Normocephalic.     Right Ear: Tympanic membrane normal.     Left Ear: Tympanic membrane normal.  Eyes:     General:        Right eye: No discharge.        Left eye: No discharge.     Pupils: Pupils are equal, round, and reactive to light.  Neck:     Thyroid: No thyromegaly.  Cardiovascular:     Rate and Rhythm: Normal rate and regular rhythm.     Heart sounds: Normal heart sounds. No murmur heard. Pulmonary:     Effort: Pulmonary effort is normal. No respiratory distress.     Breath sounds: Normal breath sounds. No wheezing.  Abdominal:     General: Bowel sounds are normal.  There is no distension.     Palpations: Abdomen is soft.     Tenderness: There is no abdominal tenderness.  Musculoskeletal:        General: No tenderness. Normal range of motion.     Cervical back: Normal range of motion and neck supple.  Skin:    General: Skin is warm and dry.     Findings: No erythema or rash.  Neurological:     Mental Status: He is alert and oriented to person, place, and time.     Cranial Nerves: No cranial nerve deficit.     Deep Tendon Reflexes: Reflexes are normal and symmetric.  Psychiatric:        Behavior: Behavior normal.        Thought Content: Thought content normal.        Judgment: Judgment normal.      Temp 98.5 F (36.9 C) (Temporal)   Wt 205 lb 12.8 oz (93.4 kg)   BMI 29.53 kg/m      Assessment & Plan:  MATTHEWS FRANKS comes in today with chief complaint of Dizziness (Over a year comes and goes )   Diagnosis and orders addressed:  1. Palpitations - EKG 12-Lead - Anemia Profile B - CMP14+EGFR - TSH  2. Dizziness - EKG 12-Lead - Anemia Profile B - CMP14+EGFR - TSH  Labs pending Avoid caffeine and stress management  Follow up with Cardiologists and will need Holter monitor.   Health Maintenance reviewed Diet and exercise encouraged    Evelina Dun, FNP

## 2021-02-01 NOTE — Patient Instructions (Signed)
Palpitations Palpitations are feelings that your heartbeat is irregular or is faster than normal. It may feel like your heart is fluttering or skipping a beat. Palpitations are usually not a serious problem. They may be caused by many things, including smoking, caffeine, alcohol, stress, and certain medicines or drugs. Most causes of palpitations are not serious. However, some palpitations can be a sign of a serious problem. You may need further tests to rule outserious medical problems. Follow these instructions at home:     Pay attention to any changes in your condition. Take these actions to helpmanage your symptoms: Eating and drinking Avoid foods and drinks that may cause palpitations. These may include: Caffeinated coffee, tea, soft drinks, diet pills, and energy drinks. Chocolate. Alcohol. Lifestyle Take steps to reduce your stress and anxiety. Things that can help you relax include: Yoga. Mind-body activities, such as deep breathing, meditation, or using words and images to create positive thoughts (guided imagery). Physical activity, such as swimming, jogging, or walking. Tell your health care provider if your palpitations increase with activity. If you have chest pain or shortness of breath with activity, do not continue the activity until you are seen by your health care provider. Biofeedback. This is a method that helps you learn to use your mind to control things in your body, such as your heartbeat. Do not use drugs, including cocaine or ecstasy. Do not use marijuana. Get plenty of rest and sleep. Keep a regular bed time. General instructions Take over-the-counter and prescription medicines only as told by your health care provider. Do not use any products that contain nicotine or tobacco, such as cigarettes and e-cigarettes. If you need help quitting, ask your health care provider. Keep all follow-up visits as told by your health care provider. This is important. These may  include visits for further testing if palpitations do not go away or get worse. Contact a health care provider if you: Continue to have a fast or irregular heartbeat after 24 hours. Notice that your palpitations occur more often. Get help right away if you: Have chest pain or shortness of breath. Have a severe headache. Feel dizzy or you faint. Summary Palpitations are feelings that your heartbeat is irregular or is faster than normal. It may feel like your heart is fluttering or skipping a beat. Palpitations may be caused by many things, including smoking, caffeine, alcohol, stress, certain medicines, and drugs. Although most causes of palpitations are not serious, some causes can be a sign of a serious medical problem. Get help right away if you faint or have chest pain, shortness of breath, a severe headache, or dizziness. This information is not intended to replace advice given to you by your health care provider. Make sure you discuss any questions you have with your healthcare provider. Document Revised: 09/02/2017 Document Reviewed: 09/02/2017 Elsevier Patient Education  2022 [employer].

## 2021-02-02 LAB — CMP14+EGFR
ALT: 8 IU/L (ref 0–44)
AST: 16 IU/L (ref 0–40)
Albumin/Globulin Ratio: 2 (ref 1.2–2.2)
Albumin: 4.1 g/dL (ref 3.7–4.7)
Alkaline Phosphatase: 82 IU/L (ref 44–121)
BUN/Creatinine Ratio: 14 (ref 10–24)
BUN: 15 mg/dL (ref 8–27)
Bilirubin Total: 1.2 mg/dL (ref 0.0–1.2)
CO2: 23 mmol/L (ref 20–29)
Calcium: 8.7 mg/dL (ref 8.6–10.2)
Chloride: 104 mmol/L (ref 96–106)
Creatinine, Ser: 1.07 mg/dL (ref 0.76–1.27)
Globulin, Total: 2.1 g/dL (ref 1.5–4.5)
Glucose: 91 mg/dL (ref 65–99)
Potassium: 4.1 mmol/L (ref 3.5–5.2)
Sodium: 141 mmol/L (ref 134–144)
Total Protein: 6.2 g/dL (ref 6.0–8.5)
eGFR: 71 mL/min/{1.73_m2} (ref >59)

## 2021-02-02 LAB — ANEMIA PROFILE B
Basophils Absolute: 0 10*3/uL (ref 0.0–0.2)
Basos: 0 %
EOS (ABSOLUTE): 0.1 10*3/uL (ref 0.0–0.4)
Eos: 2 %
Ferritin: 218 ng/mL (ref 30–400)
Folate: 8.4 ng/mL (ref >3.0)
Hematocrit: 43.1 % (ref 37.5–51.0)
Hemoglobin: 14.6 g/dL (ref 13.0–17.7)
Immature Grans (Abs): 0 10*3/uL (ref 0.0–0.1)
Immature Granulocytes: 0 %
Iron Saturation: 36 % (ref 15–55)
Iron: 97 ug/dL (ref 38–169)
Lymphocytes Absolute: 1.4 10*3/uL (ref 0.7–3.1)
Lymphs: 22 %
MCH: 29 pg (ref 26.6–33.0)
MCHC: 33.9 g/dL (ref 31.5–35.7)
MCV: 86 fL (ref 79–97)
Monocytes Absolute: 0.6 10*3/uL (ref 0.1–0.9)
Monocytes: 9 %
Neutrophils Absolute: 4.5 10*3/uL (ref 1.4–7.0)
Neutrophils: 67 %
Platelets: 224 10*3/uL (ref 150–450)
RBC: 5.04 x10E6/uL (ref 4.14–5.80)
RDW: 12.9 % (ref 11.6–15.4)
Retic Ct Pct: 1.1 % (ref 0.6–2.6)
Total Iron Binding Capacity: 273 ug/dL (ref 250–450)
UIBC: 176 ug/dL (ref 111–343)
Vitamin B-12: 284 pg/mL (ref 232–1245)
WBC: 6.6 10*3/uL (ref 3.4–10.8)

## 2021-02-02 LAB — TSH: TSH: 2.21 u[IU]/mL (ref 0.450–4.500)

## 2021-02-28 DIAGNOSIS — H0288B Meibomian gland dysfunction left eye, upper and lower eyelids: Secondary | ICD-10-CM | POA: Diagnosis not present

## 2021-02-28 DIAGNOSIS — H02831 Dermatochalasis of right upper eyelid: Secondary | ICD-10-CM | POA: Diagnosis not present

## 2021-02-28 DIAGNOSIS — H35379 Puckering of macula, unspecified eye: Secondary | ICD-10-CM | POA: Diagnosis not present

## 2021-02-28 DIAGNOSIS — H02834 Dermatochalasis of left upper eyelid: Secondary | ICD-10-CM | POA: Diagnosis not present

## 2021-02-28 DIAGNOSIS — H04123 Dry eye syndrome of bilateral lacrimal glands: Secondary | ICD-10-CM | POA: Diagnosis not present

## 2021-02-28 DIAGNOSIS — H1045 Other chronic allergic conjunctivitis: Secondary | ICD-10-CM | POA: Diagnosis not present

## 2021-02-28 DIAGNOSIS — H2513 Age-related nuclear cataract, bilateral: Secondary | ICD-10-CM | POA: Diagnosis not present

## 2021-02-28 DIAGNOSIS — H0288A Meibomian gland dysfunction right eye, upper and lower eyelids: Secondary | ICD-10-CM | POA: Diagnosis not present

## 2021-03-25 ENCOUNTER — Other Ambulatory Visit: Payer: Self-pay

## 2021-03-25 ENCOUNTER — Encounter: Payer: Self-pay | Admitting: Family Medicine

## 2021-03-25 ENCOUNTER — Ambulatory Visit (INDEPENDENT_AMBULATORY_CARE_PROVIDER_SITE_OTHER): Payer: Medicare Other | Admitting: Family Medicine

## 2021-03-25 VITALS — BP 131/74 | HR 77 | Temp 97.7°F | Ht 70.0 in | Wt 206.0 lb

## 2021-03-25 DIAGNOSIS — Z125 Encounter for screening for malignant neoplasm of prostate: Secondary | ICD-10-CM | POA: Diagnosis not present

## 2021-03-25 DIAGNOSIS — K219 Gastro-esophageal reflux disease without esophagitis: Secondary | ICD-10-CM | POA: Diagnosis not present

## 2021-03-25 DIAGNOSIS — E7800 Pure hypercholesterolemia, unspecified: Secondary | ICD-10-CM

## 2021-03-25 DIAGNOSIS — Z23 Encounter for immunization: Secondary | ICD-10-CM

## 2021-03-25 MED ORDER — OMEPRAZOLE 20 MG PO CPDR: 20.0000 mg | DELAYED_RELEASE_CAPSULE | Freq: Every day | ORAL | 3 refills | Status: DC

## 2021-03-25 NOTE — Progress Notes (Signed)
Subjective: CC: Hyperlipidemia PCP: Janora Norlander, DO [id number] Jake Barnett is a 80 y.o. male presenting to clinic today for:  1.  Hyperlipidemia Patient admits that he has not really changed much of what he eats nor his physical activity.  No chest pain, shortness of breath, abdominal pain.  He saw cardiology last year and was given a good checkup.  2.  GERD Patient reports stability of GERD symptoms with Prilosec.  Would like a prescription for this.  No nausea, vomiting or GI symptoms reported   ROS: Per HPI  Allergies  Allergen Reactions   Indomethacin Nausea Only and Other (See Comments)    Stomach ache   Indomethacin    Past Medical History:  Diagnosis Date   Arthritis    Cancer ([place])    hx of basal cell skin cancer    Cholelithiasis with chronic cholecystitis 12/04/2015   Chronic kidney disease    kidney stones   Difficult intubation 08/24/2015   GERD (gastroesophageal reflux disease)    H/O hiatal hernia    History of kidney stones    Hyperlipidemia    Left ureteral calculus    Mild diastolic dysfunction    AYMPTOMATIC   Palpitations    occasional PAC/PVC.   PONV (postoperative nausea and vomiting)    RBBB     Current Outpatient Medications:    cholecalciferol (VITAMIN D3) 25 MCG (1000 UNIT) tablet, Take 1,000 Units by mouth daily., Disp: , Rfl:    omeprazole (PRILOSEC) 20 MG capsule, Take 20 mg by mouth daily., Disp: , Rfl:  Social History   Socioeconomic History   Marital status: Married    Spouse name: Not on file   Number of children: 1   Years of education: 13   Highest education level: Some college, no degree  Occupational History   Occupation: Quarry manager: [employer]    Comment: Retired   Occupation: Freight forwarder    Comment: for 13 years  Tobacco Use   Smoking status: Never   Smokeless tobacco: Never  Vaping Use   Vaping Use: Never used  Substance and Sexual Activity   Alcohol use: No   Drug use: No   Sexual activity: Not  Currently  Other Topics Concern   Not on file  Social History Narrative   Not on file   Social Determinants of Health   Financial Resource Strain: Not on file  Food Insecurity: Not on file  Transportation Needs: Not on file  Physical Activity: Not on file  Stress: Not on file  Social Connections: Not on file  Intimate Partner Violence: Not on file   Family History  Problem Relation Age of Onset   Heart failure Mother    Aortic stenosis Mother    Heart disease Mother    Hypertension Father    Dementia Father    Arthritis Sister    Hypertension Sister    Healthy Daughter    Arthritis Daughter     Objective: Office vital signs reviewed. BP (!) 146/77   Pulse 77   Temp 97.7 F (36.5 C)   Ht '5\' 10"'$  (1.778 m)   Wt 206 lb (93.4 kg)   SpO2 97%   BMI 29.56 kg/m   Physical Examination:  General: Awake, alert, well nourished, No acute distress HEENT: Normal; sclera white.  No carotid bruits Cardio: regular rate and rhythm, S1S2 heard, no murmurs appreciated Pulm: clear to auscultation bilaterally, no wheezes, rhonchi or rales; normal work of breathing on  room air GI: soft, non-tender, non-distended, bowel sounds present x4, no hepatomegaly, no splenomegaly, no masses Extremities: warm, well perfused, No edema, cyanosis or clubbing; +2 pulses bilaterally MSK: Normal gait and station Assessment/ Plan: 80 y.o. male   Pure hypercholesterolemia - Plan: Lipid panel  Gastroesophageal reflux disease without esophagitis - Plan: omeprazole (PRILOSEC) 20 MG capsule  Screening for malignant neoplasm of prostate - Plan: PSA  We discussed his ASCVD risk score which is well over 30%.  He has unfortunately failed to change much lifestyle and therefore I think that he will likely need to proceed with rosuvastatin.  We will check a fasting lipid panel and determine what dose he might need.  Consider trial off of PPI given chronicity of use.  PSA ordered.  He is willing to pay  out-of-pocket for this if insurance does not cover  No orders of the defined types were placed in this encounter.  No orders of the defined types were placed in this encounter.    Janora Norlander, DO [place] [phone]

## 2021-03-25 NOTE — Patient Instructions (Signed)
He had a normal metabolic panel, thyroid-stimulating hormone and no evidence of anemia on the labs that were obtained through the beginning of July by Alyse Low.  I have ordered your prostate antigen test and your fasting cholesterol test.  Please make sure that you come in fasting for those  Your current 10-year risk of a stroke or heart attack is 32.1%.  This is extremely high and I anticipate that pending your cholesterol panel we will be ordering rosuvastatin for you  The 10-year ASCVD risk score Mikey Bussing DC Brooke Bonito., et al., 2013) is: 32.1%   Values used to calculate the score:     Age: 80 years     Sex: Male     Is Non-Hispanic African American: No     Diabetic: No     Tobacco smoker: No     Systolic Blood Pressure: [id number] mmHg     Is BP treated: No     HDL Cholesterol: 46 mg/dL     Total Cholesterol: 182 mg/dL

## 2021-03-28 ENCOUNTER — Other Ambulatory Visit: Payer: Self-pay

## 2021-03-28 ENCOUNTER — Other Ambulatory Visit: Payer: Medicare Other

## 2021-03-28 DIAGNOSIS — Z125 Encounter for screening for malignant neoplasm of prostate: Secondary | ICD-10-CM

## 2021-03-28 DIAGNOSIS — E7800 Pure hypercholesterolemia, unspecified: Secondary | ICD-10-CM

## 2021-03-29 ENCOUNTER — Other Ambulatory Visit: Payer: Self-pay

## 2021-03-29 DIAGNOSIS — E7800 Pure hypercholesterolemia, unspecified: Secondary | ICD-10-CM

## 2021-03-29 LAB — PSA: Prostate Specific Ag, Serum: 1.9 ng/mL (ref 0.0–4.0)

## 2021-03-29 LAB — LIPID PANEL
Chol/HDL Ratio: 3.9 ratio (ref 0.0–5.0)
Cholesterol, Total: 189 mg/dL (ref 100–199)
HDL: 49 mg/dL (ref >39)
LDL Chol Calc (NIH): 118 mg/dL — ABNORMAL HIGH (ref 0–99)
Triglycerides: 121 mg/dL (ref 0–149)
VLDL Cholesterol Cal: 22 mg/dL (ref 5–40)

## 2021-03-29 MED ORDER — ROSUVASTATIN CALCIUM 10 MG PO TABS: 10.0000 mg | ORAL_TABLET | Freq: Every day | ORAL | 3 refills | Status: DC

## 2021-05-13 ENCOUNTER — Ambulatory Visit (INDEPENDENT_AMBULATORY_CARE_PROVIDER_SITE_OTHER): Payer: Medicare Other

## 2021-05-13 VITALS — Ht 70.0 in | Wt 203.0 lb

## 2021-05-13 DIAGNOSIS — Z0000 Encounter for general adult medical examination without abnormal findings: Secondary | ICD-10-CM | POA: Diagnosis not present

## 2021-05-13 NOTE — Patient Instructions (Signed)
Jake Barnett , Thank you for taking time to come for your Medicare Wellness Visit. I appreciate your ongoing commitment to your health goals. Please review the following plan we discussed and let me know if I can assist you in the future.   Screening recommendations/referrals: Colonoscopy: Done 07/21/2019 - Repeat in 5 years Recommended yearly ophthalmology/optometry visit for glaucoma screening and checkup Recommended yearly dental visit for hygiene and checkup  Vaccinations: Influenza vaccine: Done 06/05/2020 - Repeat annually  Pneumococcal vaccine: Done 07/16/2017 & 07/06/2019 Tdap vaccine: Done 07/06/2019 - Repeat in 10 years Shingles vaccine: Done 03/25/2021 - due for second dose   Covid-19: Done 08/25/19, 09/15/19, 04/28/20, & 12/24/20  Advanced directives: Advance directive discussed with you today. Even though you declined this today, please call our office should you change your mind, and we can give you the proper paperwork for you to fill out.   Conditions/risks identified: Aim for 30 minutes of exercise or brisk walking each day, drink 6-8 glasses of water and eat lots of fruits and vegetables.   Next appointment: Follow up in one year for your annual wellness visit.   Preventive Care 79 Years and Older, Male  Preventive care refers to lifestyle choices and visits with your health care provider that can promote health and wellness. What does preventive care include? A yearly physical exam. This is also called an annual well check. Dental exams once or twice a year. Routine eye exams. Ask your health care provider how often you should have your eyes checked. Personal lifestyle choices, including: Daily care of your teeth and gums. Regular physical activity. Eating a healthy diet. Avoiding tobacco and drug use. Limiting alcohol use. Practicing safe sex. Taking low doses of aspirin every day. Taking vitamin and mineral supplements as recommended by your health care provider. What  happens during an annual well check? The services and screenings done by your health care provider during your annual well check will depend on your age, overall health, lifestyle risk factors, and family history of disease. Counseling  Your health care provider may ask you questions about your: Alcohol use. Tobacco use. Drug use. Emotional well-being. Home and relationship well-being. Sexual activity. Eating habits. History of falls. Memory and ability to understand (cognition). Work and work Statistician. Screening  You may have the following tests or measurements: Height, weight, and BMI. Blood pressure. Lipid and cholesterol levels. These may be checked every 5 years, or more frequently if you are over 63 years old. Skin check. Lung cancer screening. You may have this screening every year starting at age 32 if you have a 30-pack-year history of smoking and currently smoke or have quit within the past 15 years. Fecal occult blood test (FOBT) of the stool. You may have this test every year starting at age 58. Flexible sigmoidoscopy or colonoscopy. You may have a sigmoidoscopy every 5 years or a colonoscopy every 10 years starting at age 45. Prostate cancer screening. Recommendations will vary depending on your family history and other risks. Hepatitis C blood test. Hepatitis B blood test. Sexually transmitted disease (STD) testing. Diabetes screening. This is done by checking your blood sugar (glucose) after you have not eaten for a while (fasting). You may have this done every 1-3 years. Abdominal aortic aneurysm (AAA) screening. You may need this if you are a current or former smoker. Osteoporosis. You may be screened starting at age 9 if you are at high risk. Talk with your health care provider about your test results, treatment  options, and if necessary, the need for more tests. Vaccines  Your health care provider may recommend certain vaccines, such as: Influenza vaccine. This  is recommended every year. Tetanus, diphtheria, and acellular pertussis (Tdap, Td) vaccine. You may need a Td booster every 10 years. Zoster vaccine. You may need this after age 48. Pneumococcal 13-valent conjugate (PCV13) vaccine. One dose is recommended after age 3. Pneumococcal polysaccharide (PPSV23) vaccine. One dose is recommended after age 39. Talk to your health care provider about which screenings and vaccines you need and how often you need them. This information is not intended to replace advice given to you by your health care provider. Make sure you discuss any questions you have with your health care provider. Document Released: 08/17/2015 Document Revised: 04/09/2016 Document Reviewed: 05/22/2015 Elsevier Interactive Patient Education  2017 [employer] Prevention in the Home Falls can cause injuries. They can happen to people of all ages. There are many things you can do to make your home safe and to help prevent falls. What can I do on the outside of my home? Regularly fix the edges of walkways and driveways and fix any cracks. Remove anything that might make you trip as you walk through a door, such as a raised step or threshold. Trim any bushes or trees on the path to your home. Use bright outdoor lighting. Clear any walking paths of anything that might make someone trip, such as rocks or tools. Regularly check to see if handrails are loose or broken. Make sure that both sides of any steps have handrails. Any raised decks and porches should have guardrails on the edges. Have any leaves, snow, or ice cleared regularly. Use sand or salt on walking paths during winter. Clean up any spills in your garage right away. This includes oil or grease spills. What can I do in the bathroom? Use night lights. Install grab bars by the toilet and in the tub and shower. Do not use towel bars as grab bars. Use non-skid mats or decals in the tub or shower. If you need to sit down  in the shower, use a plastic, non-slip stool. Keep the floor dry. Clean up any water that spills on the floor as soon as it happens. Remove soap buildup in the tub or shower regularly. Attach bath mats securely with double-sided non-slip rug tape. Do not have throw rugs and other things on the floor that can make you trip. What can I do in the bedroom? Use night lights. Make sure that you have a light by your bed that is easy to reach. Do not use any sheets or blankets that are too big for your bed. They should not hang down onto the floor. Have a firm chair that has side arms. You can use this for support while you get dressed. Do not have throw rugs and other things on the floor that can make you trip. What can I do in the kitchen? Clean up any spills right away. Avoid walking on wet floors. Keep items that you use a lot in easy-to-reach places. If you need to reach something above you, use a strong step stool that has a grab bar. Keep electrical cords out of the way. Do not use floor polish or wax that makes floors slippery. If you must use wax, use non-skid floor wax. Do not have throw rugs and other things on the floor that can make you trip. What can I do with my stairs? Do not  leave any items on the stairs. Make sure that there are handrails on both sides of the stairs and use them. Fix handrails that are broken or loose. Make sure that handrails are as long as the stairways. Check any carpeting to make sure that it is firmly attached to the stairs. Fix any carpet that is loose or worn. Avoid having throw rugs at the top or bottom of the stairs. If you do have throw rugs, attach them to the floor with carpet tape. Make sure that you have a light switch at the top of the stairs and the bottom of the stairs. If you do not have them, ask someone to add them for you. What else can I do to help prevent falls? Wear shoes that: Do not have high heels. Have rubber bottoms. Are comfortable  and fit you well. Are closed at the toe. Do not wear sandals. If you use a stepladder: Make sure that it is fully opened. Do not climb a closed stepladder. Make sure that both sides of the stepladder are locked into place. Ask someone to hold it for you, if possible. Clearly mark and make sure that you can see: Any grab bars or handrails. First and last steps. Where the edge of each step is. Use tools that help you move around (mobility aids) if they are needed. These include: Canes. Walkers. Scooters. Crutches. Turn on the lights when you go into a dark area. Replace any light bulbs as soon as they burn out. Set up your furniture so you have a clear path. Avoid moving your furniture around. If any of your floors are uneven, fix them. If there are any pets around you, be aware of where they are. Review your medicines with your doctor. Some medicines can make you feel dizzy. This can increase your chance of falling. Ask your doctor what other things that you can do to help prevent falls. This information is not intended to replace advice given to you by your health care provider. Make sure you discuss any questions you have with your health care provider. Document Released: 05/17/2009 Document Revised: 12/27/2015 Document Reviewed: 08/25/2014 Elsevier Interactive Patient Education  2017 [employer].

## 2021-05-13 NOTE — Progress Notes (Signed)
Subjective:   Jake Barnett is a 80 y.o. male who presents for Medicare Annual/Subsequent preventive examination.  Virtual Visit via Telephone Note  I connected with  Jake Barnett on 05/13/21 at  4:15 PM EDT by telephone and verified that I am speaking with the correct person using two identifiers.  Location: Patient: Home Provider: [facility] Persons participating in the virtual visit: patient/Nurse Health Advisor   I discussed the limitations, risks, security and privacy concerns of performing an evaluation and management service by telephone and the availability of in person appointments. The patient expressed understanding and agreed to proceed.  Interactive audio and video telecommunications were attempted between this nurse and patient, however failed, due to patient having technical difficulties OR patient did not have access to video capability.  We continued and completed visit with audio only.  Some vital signs may be absent or patient reported.   Ripken Rekowski E Brenin Heidelberger, LPN   Review of Systems     Cardiac Risk Factors include: advanced age (>53men, >58 women);sedentary lifestyle;dyslipidemia;hypertension;male gender     Objective:    Today's Vitals   05/13/21 1619  Weight: 203 lb (92.1 kg)  Height: 5\' 10"  (1.778 m)   Body mass index is 29.13 kg/m.  Advanced Directives 05/13/2021 01/20/2018 11/29/2015 08/22/2015 06/15/2013 05/19/2013 05/13/2013  Does Patient Have a Medical Advance Directive? No No No No Patient does not have advance directive;Patient would not like information Patient does not have advance directive;Patient would not like information Patient does not have advance directive;Patient would not like information  Would patient like information on creating a medical advance directive? No - Patient declined No - Patient declined No - patient declined information No - patient declined information - - -  Pre-existing out of facility DNR order (yellow form or pink MOST form) -  - - - No No -    Current Medications (verified) Outpatient Encounter Medications as of 05/13/2021  Medication Sig   cholecalciferol (VITAMIN D3) 25 MCG (1000 UNIT) tablet Take 1,000 Units by mouth daily.   omeprazole (PRILOSEC) 20 MG capsule Take 1 capsule (20 mg total) by mouth daily.   rosuvastatin (CRESTOR) 10 MG tablet Take 1 tablet (10 mg total) by mouth daily.   No facility-administered encounter medications on file as of 05/13/2021.    Allergies (verified) Indomethacin and Indomethacin   History: Past Medical History:  Diagnosis Date   Arthritis    Cancer ([place])    hx of basal cell skin cancer    Cholelithiasis with chronic cholecystitis 12/04/2015   Chronic kidney disease    kidney stones   Difficult intubation 08/24/2015   GERD (gastroesophageal reflux disease)    H/O hiatal hernia    History of kidney stones    Hyperlipidemia    Left ureteral calculus    Mild diastolic dysfunction    AYMPTOMATIC   Palpitations    occasional PAC/PVC.   PONV (postoperative nausea and vomiting)    RBBB    Past Surgical History:  Procedure Laterality Date   ANTERIOR CERVICAL DECOMP/DISCECTOMY FUSION  2001   C5  -- C7- some limited hyperflexion of neck   CARDIOVASCULAR STRESS TEST  02-11-2011   DR CRENSHAW   NORMAL / NO ISCHEMIA/ EF 66%   CHOLECYSTECTOMY N/A 12/04/2015   Procedure: LAPAROSCOPIC CHOLECYSTECTOMY;  Surgeon: Fanny Skates, MD;  Location: [facility];  Service: General;  Laterality: N/A;   CYSTO/ BILATERAL LASER LITHOTRIPSY WITH STONE EXTRACTIONS  05-26-2011   CYSTOSCOPY W/ URETERAL STENT PLACEMENT Left  05/13/2013   Procedure: CYSTOSCOPY WITH RETROGRADE PYELOGRAM/URETERAL STENT PLACEMENT;  Surgeon: Bernestine Amass, MD;  Location[facility];  Service: Urology;  Laterality: Left;   CYSTOSCOPY WITH RETROGRADE PYELOGRAM, URETEROSCOPY AND STENT PLACEMENT Left 06/15/2013   Procedure: STENT REMOVAL/CYSTOSCOPY//URETEROSCOPYSTONE EXTRACTION WITH BASKET;  Surgeon: Bernestine Amass, MD;  Location[facility];  Service: Urology;  Laterality: Left;   HOLMIUM LASER APPLICATION Left 23/76/2831   Procedure: HOLMIUM LASER APPLICATION;  Surgeon: Bernestine Amass, MD;  [facility];  Service: Urology;  Laterality: Left;   INGUINAL HERNIA REPAIR Bilateral 08/24/2015   Procedure: LAPAROSCOPIC BILATERAL INGUINAL HERNIA REPAIR ;  Surgeon: Jackolyn Confer, MD;  [facility];  Service: General;  Laterality: Bilateral;   INSERTION OF MESH Bilateral 08/24/2015   Procedure: WITH INSERTION OF MESH;  Surgeon: Jackolyn Confer, MD;  [facility];  Service: General;  Laterality: Bilateral;   KNEE ARTHROSCOPY Right 2008   SHOULDER ARTHROSCOPY Right 2007   TOTAL KNEE ARTHROPLASTY Left 04/18/2013   Procedure: LEFT TOTAL KNEE ARTHROPLASTY;  Surgeon: Gearlean Alf, MD;  [facility];  Service: Orthopedics;  Laterality: Left;   TRANSTHORACIC ECHOCARDIOGRAM  08-07-2008   NORMAL LVF/  EF 55-60%   TYMPANOPLASTY Right 1971   mild hearing loss   Family History  Problem Relation Age of Onset   Heart failure Mother    Aortic stenosis Mother    Heart disease Mother    Hypertension Father    Dementia Father    Arthritis Sister    Hypertension Sister    Healthy Daughter    Arthritis Daughter    Social History   Socioeconomic History   Marital status: Married    Spouse name: Not on file   Number of children: 1   Years of education: 13   Highest education level: Some college, no degree  Occupational History   Occupation: Quarry manager: [employer]    Comment: Retired   Occupation: Freight forwarder    Comment: for 13 years  Tobacco Use   Smoking status: Never   Smokeless tobacco: Never  Vaping Use   Vaping Use: Never used  Substance and Sexual Activity   Alcohol use: No   Drug use: No   Sexual activity: Not Currently  Other Topics Concern   Not on file  Social History Narrative   One level living with his wife. They have a basement accessible from outdoors.    Daughter lives in [place].    Social Determinants of Health   Financial Resource Strain: Low Risk    Difficulty of Paying Living Expenses: Not hard at all  Food Insecurity: No Food Insecurity   Worried About Charity fundraiser in the Last Year: Never true   [place] in the Last Year: Never true  Transportation Needs: No Transportation Needs   Lack of Transportation (Medical): No   Lack of Transportation (Non-Medical): No  Physical Activity: Insufficiently Active   Days of Exercise per Week: 7 days   Minutes of Exercise per Session: 10 min  Stress: No Stress Concern Present   Feeling of Stress : Only a little  Social Connections: Moderately Integrated   Frequency of Communication with Friends and Family: More than three times a week   Frequency of Social Gatherings with Friends and Family: Three times a week   Attends Religious Services: More than 4 times per year   Active Member of Clubs or Organizations: No   Attends Club  or Organization Meetings: Never   Marital Status: Married    Tobacco Counseling Counseling given: Not Answered   Clinical Intake:  Pre-visit preparation completed: Yes  Pain : No/denies pain     BMI - recorded: 29.13 Nutritional Status: BMI 25 -29 Overweight Nutritional Risks: None Diabetes: No  How often do you need to have someone help you when you read instructions, pamphlets, or other written materials from your doctor or pharmacy?: 1 - Never  Diabetic? No  Interpreter Needed?: No  Information entered by :: Laci Frenkel, LPN   Activities of Daily Living In your present state of health, do you have any difficulty performing the following activities: 05/13/2021  Hearing? N  Vision? N  Difficulty concentrating or making decisions? N  Walking or climbing stairs? N  Dressing or bathing? N  Doing errands, shopping? N  Preparing Food and eating ? N  Using the Toilet? N  In the past six months, have you accidently leaked urine? N   Do you have problems with loss of bowel control? N  Managing your Medications? N  Managing your Finances? N  Housekeeping or managing your Housekeeping? N  Some recent data might be hidden    Patient Care Team: Janora Norlander, DO as PCP - General (Family Medicine) Lelon Perla, MD as Consulting Physician (Cardiology) Ceasar Mons, MD as Consulting Physician (Urology)  Indicate any recent [place] you may have received from other than [facility] providers in the past year (date may be approximate).     Assessment:   This is a routine wellness examination for [employer].  Hearing/Vision screen Hearing Screening - Comments:: Denies hearing difficulties  Vision Screening - Comments:: Wears rx glasses- up to date with annual eye exams with Dr Katy Fitch  Dietary issues and exercise activities discussed: Current Exercise Habits: Home exercise routine, Type of exercise: walking, Time (Minutes): 15, Frequency (Times/Week): 7, Weekly Exercise (Minutes/Week): 105, Intensity: Mild, Exercise limited by: orthopedic condition(s)   Goals Addressed             This Visit's Progress    Exercise 150 min/wk Moderate Activity   Not on track      Depression Screen PHQ 2/9 Scores 05/13/2021 03/25/2021 02/01/2021 01/24/2020 01/20/2018 01/12/2018 12/17/2017  PHQ - 2 Score 0 0 1 0 0 0 0  PHQ- 9 Score 0 0 3 - - - -    Fall Risk Fall Risk  05/13/2021 03/25/2021 02/01/2021 01/24/2020 03/03/2019  Falls in the past year? 0 0 0 0 (No Data)  Comment - - - - Emmi Telephone Survey: data to providers prior to load  Number falls in past yr: 0 - - - (No Data)  Comment - - - - Emmi Telephone Survey Actual Response =   Injury with Fall? 0 - - - -  Risk for fall due to : Orthopedic patient - - - -  Follow up Falls prevention discussed - - - -    FALL RISK PREVENTION PERTAINING TO THE HOME:  Any stairs in or around the home? Yes  If so, are there any without handrails?  Yes, but he doesn't use  these stairs Home free of loose throw rugs in walkways, pet beds, electrical cords, etc? Yes  Adequate lighting in your home to reduce risk of falls? Yes   ASSISTIVE DEVICES UTILIZED TO PREVENT FALLS:  Life alert? No  Use of a cane, walker or w/c? No  Grab bars in the bathroom? Yes  Shower chair or  bench in shower? Yes  Elevated toilet seat or a handicapped toilet? Yes   TIMED UP AND GO:  Was the test performed? No . Telephonic visit  Cognitive Function: Normal cognitive status assessed by direct observation by this Nurse Health Advisor. No abnormalities found.    MMSE - Mini Mental State Exam 01/20/2018  Orientation to time 5  Orientation to Place 5  Registration 3  Attention/ Calculation 5  Recall 2  Language- name 2 objects 2  Language- repeat 1  Language- follow 3 step command 3  Language- read & follow direction 1  Write a sentence 1  Copy design 1  Total score 29     6CIT Screen 05/13/2021  What Year? 0 points  What month? 0 points  What time? 0 points  Count back from 20 0 points  Months in reverse 0 points  Repeat phrase 2 points  Total Score 2    Immunizations Immunization History  Administered Date(s) Administered   Fluad Quad(high Dose 65+) 05/26/2019, 06/05/2020   Influenza, High Dose Seasonal PF 05/27/2016, 06/09/2017, 05/19/2018   Influenza,inj,Quad PF,6+ Mos 06/20/2013, 05/22/2015   PFIZER(Purple Top)SARS-COV-2 Vaccination 08/25/2019, 09/15/2019, 04/28/2020, 12/24/2020   Pneumococcal Conjugate-13 07/06/2019   Pneumococcal Polysaccharide-23 07/16/2017   Tdap 07/06/2019   Zoster Recombinat (Shingrix) 03/25/2021    TDAP status: Up to date  Flu Vaccine status: Up to date  Pneumococcal vaccine status: Up to date  Covid-19 vaccine status: Completed vaccines  Qualifies for Shingles Vaccine? Yes   Zostavax completed Yes   Shingrix Completed?: No.    Education has been provided regarding the importance of this vaccine. Patient has been advised to  call insurance company to determine out of pocket expense if they have not yet received this vaccine. Advised may also receive vaccine at local pharmacy or Health Dept. Verbalized acceptance and understanding.  Screening Tests Health Maintenance  Topic Date Due   INFLUENZA VACCINE  03/04/2021   Zoster Vaccines- Shingrix (2 of 2) 05/20/2021   COLONOSCOPY (Pts 45-66yrs Insurance coverage will need to be confirmed)  07/20/2024   TETANUS/TDAP  07/05/2029   COVID-19 Vaccine  Completed   Hepatitis C Screening  Completed   HPV VACCINES  Aged Out    Health Maintenance  Health Maintenance Due  Topic Date Due   INFLUENZA VACCINE  03/04/2021    Colorectal cancer screening: Type of screening: Colonoscopy. Completed 07/21/2019. Repeat every 5 years  Lung Cancer Screening: (Low Dose CT Chest recommended if Age 35-80 years, 30 pack-year currently smoking OR have quit w/in 15years.) does not qualify.   Additional Screening:  Hepatitis C Screening: does not qualify; Completed 07/21/2017  Vision Screening: Recommended annual ophthalmology exams for early detection of glaucoma and other disorders of the eye. Is the patient up to date with their annual eye exam?  Yes  Who is the provider or what is the name of the office in which the patient attends annual eye exams? [facility] If pt is not established with a provider, would they like to be referred to a provider to establish care? No .   Dental Screening: Recommended annual dental exams for proper oral hygiene  Community Resource Referral / Chronic Care Management: CRR required this visit?  No   CCM required this visit?  No      Plan:     I have personally reviewed and noted the following in the patient's chart:   Medical and social history Use of alcohol, tobacco or illicit drugs  Current medications and  supplements including opioid prescriptions. Patient is not currently taking opioid prescriptions. Functional ability and  status Nutritional status Physical activity Advanced directives List of other physicians Hospitalizations, surgeries, and ER visits in previous 12 months Vitals Screenings to include cognitive, depression, and falls Referrals and appointments  In addition, I have reviewed and discussed with patient certain preventive protocols, quality metrics, and best practice recommendations. A written personalized care plan for preventive services as well as general preventive health recommendations were provided to patient.     Sandrea Hammond, LPN   97/74/1423   Nurse Notes: None

## 2021-06-10 DIAGNOSIS — Z23 Encounter for immunization: Secondary | ICD-10-CM | POA: Diagnosis not present

## 2021-07-02 DIAGNOSIS — M10072 Idiopathic gout, left ankle and foot: Secondary | ICD-10-CM | POA: Diagnosis not present

## 2021-07-02 DIAGNOSIS — M79672 Pain in left foot: Secondary | ICD-10-CM | POA: Diagnosis not present

## 2021-07-08 NOTE — Progress Notes (Signed)
HPI:FU palpitations and chest pain. Previous monitor in 2009 showed sinus with occasional PVC and PAC and short nonsustained run of atrial tachycardia (4 beats). Stress echocardiogram in May of 7412 showed diastolic dysfunction but no ECG changes and no wall motion abnormalities. Patient had a Myoview in July of 2012 and showed an ejection fraction of 66% and normal perfusion. Exercise treadmill November 2015 was negative. Since last seen, he has occasional brief palpitations not particular bothersome.  He denies dyspnea, chest pain or syncope.  Occasional fatigue.  Current Outpatient Medications  Medication Sig Dispense Refill   cholecalciferol (VITAMIN D3) 25 MCG (1000 UNIT) tablet Take 1,000 Units by mouth daily.     omeprazole (PRILOSEC) 20 MG capsule Take 1 capsule (20 mg total) by mouth daily. 90 capsule 3   rosuvastatin (CRESTOR) 10 MG tablet Take 1 tablet (10 mg total) by mouth daily. 90 tablet 3   No current facility-administered medications for this visit.     Past Medical History:  Diagnosis Date   Arthritis    Cancer ([place])    hx of basal cell skin cancer    Cholelithiasis with chronic cholecystitis 12/04/2015   Chronic kidney disease    kidney stones   Difficult intubation 08/24/2015   GERD (gastroesophageal reflux disease)    H/O hiatal hernia    History of kidney stones    Hyperlipidemia    Left ureteral calculus    Mild diastolic dysfunction    AYMPTOMATIC   Palpitations    occasional PAC/PVC.   PONV (postoperative nausea and vomiting)    RBBB     Past Surgical History:  Procedure Laterality Date   ANTERIOR CERVICAL DECOMP/DISCECTOMY FUSION  2001   C5  -- C7- some limited hyperflexion of neck   CARDIOVASCULAR STRESS TEST  02-11-2011   DR Imogen Maddalena   NORMAL / NO ISCHEMIA/ EF 66%   CHOLECYSTECTOMY N/A 12/04/2015   Procedure: LAPAROSCOPIC CHOLECYSTECTOMY;  Surgeon: Fanny Skates, MD;  [facility];  Service: General;  Laterality: N/A;   CYSTO/ BILATERAL  LASER LITHOTRIPSY WITH STONE EXTRACTIONS  05-26-2011   CYSTOSCOPY W/ URETERAL STENT PLACEMENT Left 05/13/2013   Procedure: CYSTOSCOPY WITH RETROGRADE PYELOGRAM/URETERAL STENT PLACEMENT;  Surgeon: Bernestine Amass, MD;  [facility];  Service: Urology;  Laterality: Left;   CYSTOSCOPY WITH RETROGRADE PYELOGRAM, URETEROSCOPY AND STENT PLACEMENT Left 06/15/2013   Procedure: STENT REMOVAL/CYSTOSCOPY//URETEROSCOPYSTONE EXTRACTION WITH BASKET;  Surgeon: Bernestine Amass, MD;  [facility];  Service: Urology;  Laterality: Left;   HOLMIUM LASER APPLICATION Left 87/86/7672   Procedure: HOLMIUM LASER APPLICATION;  Surgeon: Bernestine Amass, MD;  [facility];  Service: Urology;  Laterality: Left;   INGUINAL HERNIA REPAIR Bilateral 08/24/2015   Procedure: LAPAROSCOPIC BILATERAL INGUINAL HERNIA REPAIR ;  Surgeon: Jackolyn Confer, MD;  [facility];  Service: General;  Laterality: Bilateral;   INSERTION OF MESH Bilateral 08/24/2015   Procedure: WITH INSERTION OF MESH;  Surgeon: Jackolyn Confer, MD;  [facility];  Service: General;  Laterality: Bilateral;   KNEE ARTHROSCOPY Right 2008   SHOULDER ARTHROSCOPY Right 2007   TOTAL KNEE ARTHROPLASTY Left 04/18/2013   Procedure: LEFT TOTAL KNEE ARTHROPLASTY;  Surgeon: Gearlean Alf, MD;  [facility];  Service: Orthopedics;  Laterality: Left;   TRANSTHORACIC ECHOCARDIOGRAM  08-07-2008   NORMAL LVF/  EF 55-60%   TYMPANOPLASTY Right 1971   mild hearing loss    Social History   Socioeconomic History   Marital status:  Married    Spouse name: Ennis Forts   Number of children: 1   Years of education: 13   Highest education level: Some college, no degree  Occupational History   Occupation: Quarry manager: [employer]    Comment: Retired   Occupation: Freight forwarder    Comment: for 13 years  Tobacco Use   Smoking status: Never   Smokeless tobacco: Never  Vaping Use   Vaping Use: Never used  Substance and Sexual Activity    Alcohol use: No   Drug use: No   Sexual activity: Not Currently  Other Topics Concern   Not on file  Social History Narrative   One level living with his wife. They have a basement accessible from outdoors.   Daughter lives in [place].    Social Determinants of Health   Financial Resource Strain: Low Risk    Difficulty of Paying Living Expenses: Not hard at all  Food Insecurity: No Food Insecurity   Worried About Charity fundraiser in the Last Year: Never true   [place] in the Last Year: Never true  Transportation Needs: No Transportation Needs   Lack of Transportation (Medical): No   Lack of Transportation (Non-Medical): No  Physical Activity: Insufficiently Active   Days of Exercise per Week: 7 days   Minutes of Exercise per Session: 10 min  Stress: No Stress Concern Present   Feeling of Stress : Only a little  Social Connections: Moderately Integrated   Frequency of Communication with Friends and Family: More than three times a week   Frequency of Social Gatherings with Friends and Family: Three times a week   Attends Religious Services: More than 4 times per year   Active Member of Clubs or Organizations: No   Attends Archivist Meetings: Never   Marital Status: Married  Human resources officer Violence: Not At Risk   Fear of Current or Ex-Partner: No   Emotionally Abused: No   Physically Abused: No   Sexually Abused: No    Family History  Problem Relation Age of Onset   Heart failure Mother    Aortic stenosis Mother    Heart disease Mother    Hypertension Father    Dementia Father    Arthritis Sister    Hypertension Sister    Healthy Daughter    Arthritis Daughter     ROS: no fevers or chills, productive cough, hemoptysis, dysphasia, odynophagia, melena, hematochezia, dysuria, hematuria, rash, seizure activity, orthopnea, PND, pedal edema, claudication. Remaining systems are negative.  Physical Exam: Well-developed well-nourished in no acute  distress.  Skin is warm and dry.  HEENT is normal.  Neck is supple.  Chest is clear to auscultation with normal expansion.  Cardiovascular exam is regular rate and rhythm.  Abdominal exam nontender or distended. No masses palpated. Extremities show no edema. neuro grossly intact  A/P  1 palpitations-symptoms are controlled.  We will consider beta-blockade in the future if necessary.  2 history of chest pain-patient has had no recurrent symptoms since last office visit.  Previous functional study unremarkable.  We will continue to follow.  3 history of elevated blood pressure-normal today.  We will follow.  4 history of mild hyperlipidemia-continue Crestor at present dose.  Most recent LDL 52.  Kirk Ruths, MD

## 2021-07-11 ENCOUNTER — Ambulatory Visit (INDEPENDENT_AMBULATORY_CARE_PROVIDER_SITE_OTHER): Payer: Medicare Other | Admitting: Family Medicine

## 2021-07-11 ENCOUNTER — Encounter: Payer: Self-pay | Admitting: Family Medicine

## 2021-07-11 VITALS — BP 119/70 | HR 71 | Temp 98.8°F | Ht 70.0 in | Wt 208.4 lb

## 2021-07-11 DIAGNOSIS — E7800 Pure hypercholesterolemia, unspecified: Secondary | ICD-10-CM

## 2021-07-11 NOTE — Progress Notes (Signed)
Subjective: CC:HLD PCP: Janora Norlander, DO YPP:JKDTO Jake Barnett is a 80 y.o. male presenting to clinic today for:  1.  Hyperlipidemia Patient was started on Crestor 10 mg daily last visit due to ASCVD risk score of greater than 28%.  He is tolerating this medication without difficulty.  No reports of myalgia, increased arthralgias, abdominal pain or jaundice.  No chest pain or shortness of breath.   ROS: Per HPI  Allergies  Allergen Reactions   Indomethacin Nausea Only and Other (See Comments)    Stomach ache   Indomethacin    Past Medical History:  Diagnosis Date   Arthritis    Cancer ([place])    hx of basal cell skin cancer    Cholelithiasis with chronic cholecystitis 12/04/2015   Chronic kidney disease    kidney stones   Difficult intubation 08/24/2015   GERD (gastroesophageal reflux disease)    H/O hiatal hernia    History of kidney stones    Hyperlipidemia    Left ureteral calculus    Mild diastolic dysfunction    AYMPTOMATIC   Palpitations    occasional PAC/PVC.   PONV (postoperative nausea and vomiting)    RBBB     Current Outpatient Medications:    cholecalciferol (VITAMIN D3) 25 MCG (1000 UNIT) tablet, Take 1,000 Units by mouth daily., Disp: , Rfl:    omeprazole (PRILOSEC) 20 MG capsule, Take 1 capsule (20 mg total) by mouth daily., Disp: 90 capsule, Rfl: 3   rosuvastatin (CRESTOR) 10 MG tablet, Take 1 tablet (10 mg total) by mouth daily., Disp: 90 tablet, Rfl: 3 Social History   Socioeconomic History   Marital status: Married    Spouse name: Ennis Forts   Number of children: 1   Years of education: 13   Highest education level: Some college, no degree  Occupational History   Occupation: Quarry manager: [employer]    Comment: Retired   Occupation: Freight forwarder    Comment: for 13 years  Tobacco Use   Smoking status: Never   Smokeless tobacco: Never  Vaping Use   Vaping Use: Never used  Substance and Sexual Activity   Alcohol use: No   Drug use: No    Sexual activity: Not Currently  Other Topics Concern   Not on file  Social History Narrative   One level living with his wife. They have a basement accessible from outdoors.   Daughter lives in [place].    Social Determinants of Health   Financial Resource Strain: Low Risk    Difficulty of Paying Living Expenses: Not hard at all  Food Insecurity: No Food Insecurity   Worried About Charity fundraiser in the Last Year: Never true   [place] in the Last Year: Never true  Transportation Needs: No Transportation Needs   Lack of Transportation (Medical): No   Lack of Transportation (Non-Medical): No  Physical Activity: Insufficiently Active   Days of Exercise per Week: 7 days   Minutes of Exercise per Session: 10 min  Stress: No Stress Concern Present   Feeling of Stress : Only a little  Social Connections: Moderately Integrated   Frequency of Communication with Friends and Family: More than three times a week   Frequency of Social Gatherings with Friends and Family: Three times a week   Attends Religious Services: More than 4 times per year   Active Member of Clubs or Organizations: No   Attends Archivist Meetings: Never  Marital Status: Married  Human resources officer Violence: Not At Risk   Fear of Current or Ex-Partner: No   Emotionally Abused: No   Physically Abused: No   Sexually Abused: No   Family History  Problem Relation Age of Onset   Heart failure Mother    Aortic stenosis Mother    Heart disease Mother    Hypertension Father    Dementia Father    Arthritis Sister    Hypertension Sister    Healthy Daughter    Arthritis Daughter     Objective: Office vital signs reviewed. BP 119/70   Pulse 71   Temp 98.8 F (37.1 C) (Temporal)   Ht 5\' 10"  (1.778 m)   Wt 208 lb 6.4 oz (94.5 kg)   SpO2 98%   BMI 29.90 kg/m   Physical Examination:  General: Awake, alert, well nourished, No acute distress HEENT: Normal; sclera white Cardio: regular  rate and rhythm, S1S2 heard, no murmurs appreciated Pulm: clear to auscultation bilaterally, no wheezes, rhonchi or rales; normal work of breathing on room air   Assessment/ Plan: 80 y.o. male   Pure hypercholesterolemia - Plan: LDL Cholesterol, Direct, Hepatic Function Panel  Check direct LDL given nonfasting status.  Check LFTs.  Anticipate continued use of Crestor 10 mg daily.  He may follow-up in 6 months, sooner if concerns arise  No orders of the defined types were placed in this encounter.  No orders of the defined types were placed in this encounter.    Janora Norlander, DO [place] [phone]

## 2021-07-12 LAB — HEPATIC FUNCTION PANEL
ALT: 9 IU/L (ref 0–44)
AST: 18 IU/L (ref 0–40)
Albumin: 4.1 g/dL (ref 3.7–4.7)
Alkaline Phosphatase: 85 IU/L (ref 44–121)
Bilirubin Total: 0.9 mg/dL (ref 0.0–1.2)
Bilirubin, Direct: 0.22 mg/dL (ref 0.00–0.40)
Total Protein: 6.2 g/dL (ref 6.0–8.5)

## 2021-07-12 LAB — LDL CHOLESTEROL, DIRECT: LDL Direct: 52 mg/dL (ref 0–99)

## 2021-07-15 ENCOUNTER — Other Ambulatory Visit: Payer: Self-pay

## 2021-07-15 ENCOUNTER — Ambulatory Visit (INDEPENDENT_AMBULATORY_CARE_PROVIDER_SITE_OTHER): Payer: Medicare Other | Admitting: Cardiology

## 2021-07-15 ENCOUNTER — Encounter: Payer: Self-pay | Admitting: Cardiology

## 2021-07-15 VITALS — BP 140/72 | HR 82 | Ht 70.0 in | Wt 208.2 lb

## 2021-07-15 DIAGNOSIS — E7800 Pure hypercholesterolemia, unspecified: Secondary | ICD-10-CM | POA: Diagnosis not present

## 2021-07-15 DIAGNOSIS — R072 Precordial pain: Secondary | ICD-10-CM

## 2021-07-15 DIAGNOSIS — R002 Palpitations: Secondary | ICD-10-CM | POA: Diagnosis not present

## 2021-07-15 NOTE — Patient Instructions (Signed)

## 2021-07-30 DIAGNOSIS — M79672 Pain in left foot: Secondary | ICD-10-CM | POA: Diagnosis not present

## 2021-07-30 DIAGNOSIS — M10072 Idiopathic gout, left ankle and foot: Secondary | ICD-10-CM | POA: Diagnosis not present

## 2021-10-11 ENCOUNTER — Ambulatory Visit (INDEPENDENT_AMBULATORY_CARE_PROVIDER_SITE_OTHER): Payer: Medicare Other | Admitting: Family Medicine

## 2021-10-11 ENCOUNTER — Encounter: Payer: Self-pay | Admitting: Family Medicine

## 2021-10-11 VITALS — Temp 98.6°F | Ht 70.0 in | Wt 211.0 lb

## 2021-10-11 DIAGNOSIS — M1611 Unilateral primary osteoarthritis, right hip: Secondary | ICD-10-CM | POA: Diagnosis not present

## 2021-10-11 DIAGNOSIS — R42 Dizziness and giddiness: Secondary | ICD-10-CM

## 2021-10-11 MED ORDER — PREDNISONE 10 MG (21) PO TBPK: ORAL_TABLET | ORAL | 0 refills | Status: DC

## 2021-10-11 MED ORDER — MECLIZINE HCL 25 MG PO TABS: 12.5000 mg | ORAL_TABLET | Freq: Three times a day (TID) | ORAL | 0 refills | Status: DC | PRN

## 2021-10-11 NOTE — Patient Instructions (Signed)
Go up to twice daily on the Omeprazole, while you are taking the Prednisone for the hip ?I have given you Meclizine for breakthrough dizziness. ? ?Benign Positional Vertigo ?Vertigo is the feeling that you or your surroundings are moving when they are not. Benign positional vertigo is the most common form of vertigo. This is usually a harmless condition (benign). This condition is positional. This means that symptoms are triggered by certain movements and positions. ?This condition can be dangerous if it occurs while you are doing something that could cause harm to yourself or others. This includes activities such as driving or operating machinery. ?What are the causes? ?The inner ear has fluid-filled canals that help your brain sense movement and balance. When the fluid moves, the brain receives messages about your body's position. ?With benign positional vertigo, calcium crystals in the inner ear break free and disturb the inner ear area. This causes your brain to receive confusing messages about your body's position. ?What increases the risk? ?You are more likely to develop this condition if: ?You are a woman. ?You are 80 years of age or older. ?You have recently had a head injury. ?You have an inner ear disease. ?What are the signs or symptoms? ?Symptoms of this condition usually happen when you move your head or your eyes in different directions. Symptoms may start suddenly and usually last for less than a minute. They include: ?Loss of balance and falling. ?Feeling like you are spinning or moving. ?Feeling like your surroundings are spinning or moving. ?Nausea and vomiting. ?Blurred vision. ?Dizziness. ?Involuntary eye movement (nystagmus). ?Symptoms can be mild and cause only minor problems, or they can be severe and interfere with daily life. Episodes of benign positional vertigo may return (recur) over time. Symptoms may also improve over time. ?How is this diagnosed? ?This condition may be diagnosed based  on: ?Your medical history. ?A physical exam of the head, neck, and ears. ?Positional tests to check for or stimulate vertigo. You may be asked to turn your head and change positions, such as going from sitting to lying down. A health care provider will watch for symptoms of vertigo. ?You may be referred to a health care provider who specializes in ear, nose, and throat problems (ENT or otolaryngologist) or a provider who specializes in disorders of the nervous system (neurologist). ?How is this treated? ?This condition may be treated in a session in which your health care provider moves your head in specific positions to help the displaced crystals in your inner ear move. Treatment for this condition may take several sessions. Surgery may be needed in severe cases, but this is rare. ?In some cases, benign positional vertigo may resolve on its own in 2-4 weeks. ?Follow these instructions at home: ?Safety ?Move slowly. Avoid sudden body or head movements or certain positions, as told by your health care provider. ?Avoid driving or operating machinery until your health care provider says it is safe. ?Avoid doing any tasks that would be dangerous to you or others if vertigo occurs. ?If you have trouble walking or keeping your balance, try using a cane for stability. If you feel dizzy or unstable, sit down right away. ?Return to your normal activities as told by your health care provider. Ask your health care provider what activities are safe for you. ?General instructions ?Take over-the-counter and prescription medicines only as told by your health care provider. ?Drink enough fluid to keep your urine pale yellow. ?Keep all follow-up visits. This is important. ?Contact a  health care provider if: ?You have a fever. ?Your condition gets worse or you develop new symptoms. ?Your family or friends notice any behavioral changes. ?You have nausea or vomiting that gets worse. ?You have numbness or a prickling and tingling  sensation. ?Get help right away if you: ?Have difficulty speaking or moving. ?Are always dizzy or faint. ?Develop severe headaches. ?Have weakness in your legs or arms. ?Have changes in your hearing or vision. ?Develop a stiff neck. ?Develop sensitivity to light. ?These symptoms may represent a serious problem that is an emergency. Do not wait to see if the symptoms will go away. Get medical help right away. Call your local emergency services (911 in the [place] Do not drive yourself to the hospital. ?Summary ?Vertigo is the feeling that you or your surroundings are moving when they are not. Benign positional vertigo is the most common form of vertigo. ?This condition is caused by calcium crystals in the inner ear that become displaced. This causes a disturbance in an area of the inner ear that helps your brain sense movement and balance. ?Symptoms include loss of balance and falling, feeling that you or your surroundings are moving, nausea and vomiting, and blurred vision. ?This condition can be diagnosed based on symptoms, a physical exam, and positional tests. ?Follow safety instructions as told by your health care provider and keep all follow-up visits. This is important. ?This information is not intended to replace advice given to you by your health care provider. Make sure you discuss any questions you have with your health care provider. ?Document Revised: 06/20/2020 Document Reviewed: 06/20/2020 ?Elsevier Patient Education ? [place]. ? ?

## 2021-10-11 NOTE — Progress Notes (Signed)
? ?Subjective: ?CC: Dizziness ?PCP: Janora Norlander, DO ?Jake Barnett is a 81 y.o. male presenting to clinic today for: ? ?1.  Dizziness ?Patient has suffered from intermittent dizziness that is always been associated with uncontrolled acid reflux.  He knows that this is strange in fact has mentioned it to his GI doctor who cannot really find a correlation in the past.  As of late his acid reflux has been worsening a little bit but patient notes that the dizziness is a little different than his normal.  It started recently when he was laying in bed and went to turn to the side.  The room seems to spin a little bit but got better after he stood up.  He has had no losses of consciousness or presyncopal episodes.  No nystagmus.  Denies any sinus symptoms including drainage.  No treatments thus far ? ?2.  Chronic right hip pain ?Patient with known osteoarthritis of the right hip.  He is under the care of orthopedics for this but has not been treated in over a year for it.  Typically prednisone has helped and he is asking for prednisone Dosepak today. ? ?ROS: Per HPI ? ?Allergies  ?Allergen Reactions  ? Indomethacin Nausea Only and Other (See Comments)  ?  Stomach ache  ? Indomethacin   ? ?Past Medical History:  ?Diagnosis Date  ? Arthritis   ? Cancer [facility])   ? hx of basal cell skin cancer   ? Cholelithiasis with chronic cholecystitis 12/04/2015  ? Chronic kidney disease   ? kidney stones  ? Difficult intubation 08/24/2015  ? GERD (gastroesophageal reflux disease)   ? H/O hiatal hernia   ? History of kidney stones   ? Hyperlipidemia   ? Left ureteral calculus   ? Mild diastolic dysfunction   ? AYMPTOMATIC  ? Palpitations   ? occasional PAC/PVC.  ? PONV (postoperative nausea and vomiting)   ? RBBB   ? ? ?Current Outpatient Medications:  ?  BIOTIN PO, Take by mouth., Disp: , Rfl:  ?  Casanthranol-Docusate Sodium (STOOL SOFTENER PLUS PO), Take by mouth., Disp: , Rfl:  ?  cholecalciferol (VITAMIN D3) 25 MCG (1000  UNIT) tablet, Take 1,000 Units by mouth daily., Disp: , Rfl:  ?  omeprazole (PRILOSEC) 20 MG capsule, Take 1 capsule (20 mg total) by mouth daily., Disp: 90 capsule, Rfl: 3 ?  rosuvastatin (CRESTOR) 10 MG tablet, Take 1 tablet (10 mg total) by mouth daily., Disp: 90 tablet, Rfl: 3 ?Social History  ? ?Socioeconomic History  ? Marital status: Married  ?  Spouse name: Ennis Forts  ? Number of children: 1  ? Years of education: 64  ? Highest education level: Some college, no degree  ?Occupational History  ? Occupation: Lineman  ?  Employer: [employer]  ?  Comment: Retired  ? 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 Smoking status: Never  ? Smokeless tobacco: Never  ?Vaping Use  ? Vaping Use: Never used  ?Substance and Sexual Activity  ? Alcohol use: No  ? Drug use: No  ? Sexual activity: Not Currently  ?Other Topics Concern  ? Not on file  ?Social History Narrative  ? One level living with his wife. They have a basement accessible from outdoors.  ? Daughter lives in [place].   ? ?Social Determinants of Health  ? ?Financial Resource Strain: Low Risk   ? Difficulty of Paying Living Expenses: Not hard at all  ?  Food Insecurity: No Food Insecurity  ? Worried About Charity fundraiser in the Last Year: Never true  ? Ran Out of Food in the Last Year: Never true  ?Transportation Needs: No Transportation Needs  ? Lack of Transportation (Medical): No  ? Lack of Transportation (Non-Medical): No  ?Physical Activity: Insufficiently Active  ? Days of Exercise per Week: 7 days  ? Minutes of Exercise per Session: 10 min  ?Stress: No Stress Concern Present  ? Feeling of Stress : Only a little  ?Social Connections: Moderately Integrated  ? Frequency of Communication with Friends and Family: More than three times a week  ? Frequency of Social Gatherings with Friends and Family: Three times a week  ? Attends Religious Services: More than 4 times per year  ? Active Member of Clubs or Organizations: No  ? Attends Theatre manager Meetings: Never  ? Marital Status: Married  ?Intimate Partner Violence: Not At Risk  ? Fear of Current or Ex-Partner: No  ? Emotionally Abused: No  ? Physically Abused: No  ? Sexually Abused: No  ? ?Family History  ?Problem Relation Age of Onset  ? Heart failure Mother   ? Aortic stenosis Mother   ? Heart disease Mother   ? Hypertension Father   ? Dementia Father   ? Arthritis Sister   ? Hypertension Sister   ? Healthy Daughter   ? Arthritis Daughter   ? ? ?Objective: ?Office vital signs reviewed. ?Temp 98.6 ?F (37 ?C) (Temporal)   Ht '5\' 10"'$  (1.778 m)   Wt 211 lb (95.7 kg)   BMI 30.28 kg/m?  ? ?Physical Examination:  ?General: Awake, alert, well nourished, No acute distress ?HEENT: Sclera white.  PERRLA, EOMI. TMs intact bilaterally.  No abnormalities appreciated except for some scarring on the right TM ?Cardio: regular rate and rhythm, S1S2 heard, no murmurs appreciated ?Pulm: clear to auscultation bilaterally, no wheezes, rhonchi or rales; normal work of breathing on room air ?MSK: Ambulating independently with normal gait and station ?Neuro: No focal neurologic deficits.  No nystagmus ? ?Orthostatic VS for the past 72 hrs (Last 3 readings): ? Orthostatic BP Patient Position Orthostatic Pulse  ?10/11/21 1117 143/77 Standing 76  ?10/11/21 1116 133/77 Sitting 71  ?10/11/21 1113 139/76 Supine 66  ? ? ?Assessment/ Plan: ?81 y.o. male  ? ?Vertigo - Plan: meclizine (ANTIVERT) 25 MG tablet ? ?Primary osteoarthritis of right hip - Plan: predniSONE (STERAPRED UNI-PAK 21 TAB) 10 MG (21) TBPK tablet ? ?Uncertain etiology of his vertigo but suspect BPPV.  His orthostatics were totally normal.  Uncertain as to why in the world is dizziness would correlate with gastritis but I have advised him to go ahead and up his PPI to twice daily for the next week or so.  Antivert given for as needed use.  Caution sedation. ? ?He is having a flareup of his chronic right OA of the hip and prednisone has worked in the past  that this has been ordered for the patient.  He will continue following up with orthopedics as needed for this issue ? ?No orders of the defined types were placed in this encounter. ? ?No orders of the defined types were placed in this encounter. ? ? ? ?Janora Norlander, DO ?[place] ?[phone] ? ?

## 2021-10-21 ENCOUNTER — Telehealth: Payer: Self-pay | Admitting: Family Medicine

## 2021-10-21 DIAGNOSIS — R42 Dizziness and giddiness: Secondary | ICD-10-CM

## 2021-10-21 NOTE — Telephone Encounter (Signed)
Referral placed for dr Constance Holster ?

## 2021-10-21 NOTE — Telephone Encounter (Signed)
Pt does have an ENT doctor, DR.Constance Holster on [facility] ?

## 2021-10-21 NOTE — Telephone Encounter (Signed)
REFERRAL REQUEST ?Telephone Note ? ?Have you been seen at our office for this problem? yes ?(Advise that they may need an appointment with their PCP before a referral can be done) ? ?Reason for Referral: dizzy spells ?Referral discussed with patient: yes  ?Best contact number of patient for referral team: [phone]    ?Has patient been seen by a specialist for this issue before: no  ?Patient provider preference for referral: no ?Patient location preference for referral: [place] ?  ?Patient notified that referrals can take up to a week or longer to process. If they haven't heard anything within a week they should call back and speak with the referral department.  ? ?

## 2021-11-11 ENCOUNTER — Ambulatory Visit: Payer: Medicare Other

## 2021-12-02 DIAGNOSIS — D485 Neoplasm of uncertain behavior of skin: Secondary | ICD-10-CM | POA: Diagnosis not present

## 2021-12-02 DIAGNOSIS — L570 Actinic keratosis: Secondary | ICD-10-CM | POA: Diagnosis not present

## 2021-12-02 DIAGNOSIS — L821 Other seborrheic keratosis: Secondary | ICD-10-CM | POA: Diagnosis not present

## 2021-12-02 DIAGNOSIS — L578 Other skin changes due to chronic exposure to nonionizing radiation: Secondary | ICD-10-CM | POA: Diagnosis not present

## 2021-12-04 DIAGNOSIS — D485 Neoplasm of uncertain behavior of skin: Secondary | ICD-10-CM | POA: Diagnosis not present

## 2021-12-05 DIAGNOSIS — R42 Dizziness and giddiness: Secondary | ICD-10-CM | POA: Diagnosis not present

## 2021-12-12 DIAGNOSIS — R42 Dizziness and giddiness: Secondary | ICD-10-CM | POA: Diagnosis not present

## 2021-12-16 DIAGNOSIS — N200 Calculus of kidney: Secondary | ICD-10-CM | POA: Diagnosis not present

## 2021-12-18 DIAGNOSIS — R42 Dizziness and giddiness: Secondary | ICD-10-CM | POA: Diagnosis not present

## 2021-12-24 DIAGNOSIS — R42 Dizziness and giddiness: Secondary | ICD-10-CM | POA: Diagnosis not present

## 2022-01-06 ENCOUNTER — Encounter: Payer: Self-pay | Admitting: Physician Assistant

## 2022-01-06 ENCOUNTER — Ambulatory Visit (INDEPENDENT_AMBULATORY_CARE_PROVIDER_SITE_OTHER): Payer: Medicare Other

## 2022-01-06 ENCOUNTER — Ambulatory Visit (INDEPENDENT_AMBULATORY_CARE_PROVIDER_SITE_OTHER): Payer: Medicare Other | Admitting: Physician Assistant

## 2022-01-06 VITALS — BP 145/77 | HR 75 | Ht 71.0 in | Wt 212.0 lb

## 2022-01-06 DIAGNOSIS — R42 Dizziness and giddiness: Secondary | ICD-10-CM | POA: Diagnosis not present

## 2022-01-06 DIAGNOSIS — R002 Palpitations: Secondary | ICD-10-CM

## 2022-01-06 DIAGNOSIS — I471 Supraventricular tachycardia: Secondary | ICD-10-CM | POA: Diagnosis not present

## 2022-01-06 DIAGNOSIS — R079 Chest pain, unspecified: Secondary | ICD-10-CM | POA: Diagnosis not present

## 2022-01-06 DIAGNOSIS — K219 Gastro-esophageal reflux disease without esophagitis: Secondary | ICD-10-CM | POA: Diagnosis not present

## 2022-01-06 NOTE — Progress Notes (Unsigned)
Enrolled for [employer] to mail a ZIO XT long term holter monitor to the patients address on file.   Dr. Crenshaw to read. 

## 2022-01-06 NOTE — Progress Notes (Signed)
Cardiology Office Note:    Date:  01/06/2022   ID:  Jake Barnett, DOB [birth year], MRN [id number]  PCP:  Janora Norlander, DO   [facility] Providers Cardiologist:  Kirk Ruths, MD Cardiology APP:  Ledora Bottcher, PA { Referring MD: Janora Norlander, DO   Chief Complaint  Patient presents with   Follow-up    Dizziness     History of Present Illness:    Jake Barnett is a 81 y.o. male with a hx of palpitations and chest pain. Heart monitor 2009 with occasional PVC, PAC and short nonsustained atrial tachycardia. Stress echo 12/2008 with no ischemic but diastolic dysfunction. Exercise treadmill 06/2014 was negative for ischemia. Last seen in clinic 07/15/21 with Dr. Stanford Breed and was doing well. He continued to have non-bothersome palpitations.  He presents today for dizziness and lightheadedness. He states  3 months ago he woke up and felt the room spinning. Since then, no further room spinning but has felt lightheaded intermittently, no falls.  He went to ENT and did vertigo PT, but was determined to not have vertigo and did not have relief with therapy. He also states he is getting more exertional fatigue. Can push mow his yard for 30 min but is now going a little slower slower  -no chest pain or dyspnea. He feels more exhausted when he stops an activity. He just finished painting a room. He has chest tightness intermittently but thinks this is due to indigestion. He has also battled kidney stones and passed one 3  months ago.   CP happens at rest and can last for 30 min - associated with lying in bed. He suspects chest tightness is due to acid reflux. He takes an extra prilosec or zantac which relieves his chest tightness.   Sounds like his dizziness is positional. Orthostatic vitals were previously negative for hypotension.    Past Medical History:  Diagnosis Date   Arthritis    Cancer ([place])    hx of basal cell skin cancer    Cholelithiasis with chronic cholecystitis  12/04/2015   Chronic kidney disease    kidney stones   Difficult intubation 08/24/2015   GERD (gastroesophageal reflux disease)    H/O hiatal hernia    History of kidney stones    Hyperlipidemia    Left ureteral calculus    Mild diastolic dysfunction    AYMPTOMATIC   Palpitations    occasional PAC/PVC.   PONV (postoperative nausea and vomiting)    RBBB     Past Surgical History:  Procedure Laterality Date   ANTERIOR CERVICAL DECOMP/DISCECTOMY FUSION  2001   C5  -- C7- some limited hyperflexion of neck   CARDIOVASCULAR STRESS TEST  02-11-2011   DR CRENSHAW   NORMAL / NO ISCHEMIA/ EF 66%   CHOLECYSTECTOMY N/A 12/04/2015   Procedure: LAPAROSCOPIC CHOLECYSTECTOMY;  Surgeon: Fanny Skates, MD;  Location: [facility];  Service: General;  Laterality: N/A;   CYSTO/ BILATERAL LASER LITHOTRIPSY WITH STONE EXTRACTIONS  05-26-2011   CYSTOSCOPY W/ URETERAL STENT PLACEMENT Left 05/13/2013   Procedure: CYSTOSCOPY WITH RETROGRADE PYELOGRAM/URETERAL STENT PLACEMENT;  Surgeon: Bernestine Amass, MD;  Location: [facility];  Service: Urology;  Laterality: Left;   CYSTOSCOPY WITH RETROGRADE PYELOGRAM, URETEROSCOPY AND STENT PLACEMENT Left 06/15/2013   Procedure: STENT REMOVAL/CYSTOSCOPY//URETEROSCOPYSTONE EXTRACTION WITH BASKET;  Surgeon: Bernestine Amass, MD;  Location: [facility];  Service: Urology;  Laterality: Left;   HOLMIUM LASER APPLICATION Left 03/70/4888   Procedure: HOLMIUM LASER APPLICATION;  Surgeon: Bernestine Amass, MD;  Location: [facility];  Service: Urology;  Laterality: Left;   INGUINAL HERNIA REPAIR Bilateral 08/24/2015   Procedure: LAPAROSCOPIC BILATERAL INGUINAL HERNIA REPAIR ;  Surgeon: Jackolyn Confer, MD;  Location: [facility];  Service: General;  Laterality: Bilateral;   INSERTION OF MESH Bilateral 08/24/2015   Procedure: WITH INSERTION OF MESH;  Surgeon: Jackolyn Confer, MD;  Location: [facility];  Service: General;  Laterality: Bilateral;   KNEE ARTHROSCOPY Right 2008   SHOULDER ARTHROSCOPY  Right 2007   TOTAL KNEE ARTHROPLASTY Left 04/18/2013   Procedure: LEFT TOTAL KNEE ARTHROPLASTY;  Surgeon: Gearlean Alf, MD;  Location: [facility];  Service: Orthopedics;  Laterality: Left;   TRANSTHORACIC ECHOCARDIOGRAM  08-07-2008   NORMAL LVF/  EF 55-60%   TYMPANOPLASTY Right 1971   mild hearing loss    Current Medications: Current Meds  Medication Sig   Casanthranol-Docusate Sodium (STOOL SOFTENER PLUS PO) Take by mouth.   cholecalciferol (VITAMIN D3) 25 MCG (1000 UNIT) tablet Take 1,000 Units by mouth daily.   omeprazole (PRILOSEC) 20 MG capsule Take 1 capsule (20 mg total) by mouth daily.   [DISCONTINUED] rosuvastatin (CRESTOR) 10 MG tablet Take 1 tablet (10 mg total) by mouth daily.     Allergies:   Indomethacin and Indomethacin   Social History   Socioeconomic History   Marital status: Married    Spouse name: Ennis Forts   Number of children: 1   Years of education: 13   Highest education level: Some college, no degree  Occupational History   Occupation: Quarry manager: [employer]    Comment: Retired   Occupation: Freight forwarder    Comment: for 13 years  Tobacco Use   Smoking status: Never   Smokeless tobacco: Never  Vaping Use   Vaping Use: Never used  Substance and Sexual Activity   Alcohol use: No   Drug use: No   Sexual activity: Not Currently  Other Topics Concern   Not on file  Social History Narrative   One level living with his wife. They have a basement accessible from outdoors.   Daughter lives in [place].    Social Determinants of Health   Financial Resource Strain: Low Risk    Difficulty of Paying Living Expenses: Not hard at all  Food Insecurity: No Food Insecurity   Worried About Charity fundraiser in the Last Year: Never true   [place] in the Last Year: Never true  Transportation Needs: No Transportation Needs   Lack of Transportation (Medical): No   Lack of Transportation (Non-Medical): No  Physical Activity: Insufficiently Active    Days of Exercise per Week: 7 days   Minutes of Exercise per Session: 10 min  Stress: No Stress Concern Present   Feeling of Stress : Only a little  Social Connections: Moderately Integrated   Frequency of Communication with Friends and Family: More than three times a week   Frequency of Social Gatherings with Friends and Family: Three times a week   Attends Religious Services: More than 4 times per year   Active Member of Clubs or Organizations: No   Attends Archivist Meetings: Never   Marital Status: Married     Family History: The patient's family history includes Aortic stenosis in his mother; Arthritis in his daughter and sister; Dementia in his father; Healthy in his daughter; Heart disease in his mother; Heart failure in his mother; Hypertension in his father and sister.  ROS:  Please see the history of present illness.     All other systems reviewed and are negative.  EKGs/Labs/Other Studies Reviewed:    The following studies were reviewed today:  Stress echo  EKG:  EKG is  ordered today.  The ekg ordered today demonstrates sinus rhythm HR 71  Recent Labs: 02/01/2021: BUN 15; Creatinine, Ser 1.07; Hemoglobin 14.6; Platelets 224; Potassium 4.1; Sodium 141; TSH 2.210 07/11/2021: ALT 9  Recent Lipid Panel    Component Value Date/Time   CHOL 189 03/28/2021 0849   TRIG 121 03/28/2021 0849   HDL 49 03/28/2021 0849   CHOLHDL 3.9 03/28/2021 0849   CHOLHDL 4 09/02/2012 1001   VLDL 23.6 09/02/2012 1001   LDLCALC 118 (H) 03/28/2021 0849   LDLDIRECT 52 07/11/2021 1518     Risk Assessment/Calculations:           Physical Exam:    VS:  BP (!) 145/77 (BP Location: Right Arm, Patient Position: Standing, Cuff Size: Large)   Pulse 75   Ht '5\' 11"'$  (1.803 m)   Wt 212 lb (96.2 kg)   SpO2 97%   BMI 29.57 kg/m     Wt Readings from Last 3 Encounters:  01/06/22 212 lb (96.2 kg)  10/11/21 211 lb (95.7 kg)  07/15/21 208 lb 3.2 oz (94.4 kg)     GEN:  Well  nourished, well developed in no acute distress HEENT: Normal NECK: No JVD; No carotid bruits LYMPHATICS: No lymphadenopathy CARDIAC: RRR, no murmurs, rubs, gallops RESPIRATORY:  Clear to auscultation without rales, wheezing or rhonchi  ABDOMEN: Soft, non-tender, non-distended MUSCULOSKELETAL:  No edema; No deformity  SKIN: Warm and dry NEUROLOGIC:  Alert and oriented x 3 PSYCHIATRIC:  Normal affect   ASSESSMENT:    1. Palpitations   2. Dizziness   3. Chest pain of uncertain etiology   4. Gastroesophageal reflux disease without esophagitis   5. Atrial tachycardia (HCC)    PLAN:    In order of problems listed above:  Dizziness / lightheadedness - He states ENT has ruled out vertigo, vertigo therapy did not help - he is not orthostatic today - sounds like most of his symptoms started shortly after starting crestor 10 mg earlier this year - LDL before crestor was 52 - he has no history of ischemic heart disease or stroke - I have advised stopping crestor for at least a month, possibly permanently and observe symptoms - given his history of ectopy, will repeat a heart monitor - based on history and exam, I do not feel strongly about echo or ischemic evaluation at this time - consider echo if he continues to have symptoms and heart monitor is unrevealing   Chest pain - related to GERD - CP is not exertional and relieved with extra prilosec or zantac - no dyspnea - Prior negative ischemic evaluations - he can walk 1-1.5 miles 4-5 times weekly without chest pain   PACs PVCs  Atrial tachycardia - not on a BB - remotely was on ranexa, but hasn't taken this since 2013 - not complaining of palpitations   Hyperlipidemia  - he states he wasn't taking this until this year, although listed since 03/2021 - stop crestor   Hypertension BP today controlled Hx of elevated BP, not on medications   Follow up on 6 weeks with me, three months with Dr. Stanford Breed.     Medication  Adjustments/Labs and Tests Ordered: Current medicines are reviewed at length with the patient today.  Concerns regarding medicines are  outlined above.  Orders Placed This Encounter  Procedures   LONG TERM MONITOR (3-14 DAYS)   EKG 12-Lead   No orders of the defined types were placed in this encounter.   Patient Instructions  Medication Instructions:  Stop Crestor *If you need a refill on your cardiac medications before your next appointment, please call your pharmacy*   Lab Work: No Labs If you have labs (blood work) drawn today and your tests are completely normal, you will receive your results only by: [place] (if you have MyChart) OR A paper copy in the mail If you have any lab test that is abnormal or we need to change your treatment, we will call you to review the results.   Testing/Procedures: Bryn Gulling- Long Term Monitor Instructions  Your physician has requested you wear a ZIO patch monitor for 14 days.  This is a single patch monitor. [employer] supplies one patch monitor per enrollment. Additional stickers are not available. Please do not apply patch if you will be having a Nuclear Stress Test,  Echocardiogram, Cardiac CT, MRI, or Chest Xray during the period you would be wearing the  monitor. The patch cannot be worn during these tests. You cannot remove and re-apply the  ZIO XT patch monitor.  Your ZIO patch monitor will be mailed 3 day USPS to your address on file. It may take 3-5 days  to receive your monitor after you have been enrolled.  Once you have received your monitor, please review the enclosed instructions. Your monitor  has already been registered assigning a specific monitor serial # to you.  Billing and Patient Assistance Program Information  We have supplied [employer] with any of your insurance information on file for billing purposes. [employer] offers a sliding scale Patient Assistance Program for patients that do not have  insurance, or whose  insurance does not completely cover the cost of the ZIO monitor.  You must apply for the Patient Assistance Program to qualify for this discounted rate.  To apply, please call [employer] at [phone], select option 4, select option 2, ask to apply for  Patient Assistance Program. Theodore Demark will ask your household income, and how many people  are in your household. They will quote your out-of-pocket cost based on that information.  [employer] will also be able to set up a 45-month interest-free payment plan if needed.  Applying the monitor   Shave hair from upper left chest.  Hold abrader disc by orange tab. Rub abrader in 40 strokes over the upper left chest as  indicated in your monitor instructions.  Clean area with 4 enclosed alcohol pads. Let dry.  Apply patch as indicated in monitor instructions. Patch will be placed under collarbone on left  side of chest with arrow pointing upward.  Rub patch adhesive wings for 2 minutes. Remove white label marked "1". Remove the white  label marked "2". Rub patch adhesive wings for 2 additional minutes.  While looking in a mirror, press and release button in center of patch. A small green light will  flash 3-4 times. This will be your only indicator that the monitor has been turned on.  Do not shower for the first 24 hours. You may shower after the first 24 hours.  Press the button if you feel a symptom. You will hear a small click. Record Date, Time and  Symptom in the Patient Logbook.  When you are ready to remove the patch, follow instructions on the last 2 pages of Patient  Logbook. Stick patch monitor onto the last page of Patient Logbook.  Place Patient Logbook in the blue and white box. Use locking tab on box and tape box closed  securely. The blue and white box has prepaid postage on it. Please place it in the mailbox as  soon as possible. Your physician should have your test results approximately 7 days after the  monitor has been mailed back  to [facility].  Call [place] at [phone] if you have questions regarding  your ZIO XT patch monitor. Call them immediately if you see an orange light blinking on your  monitor.  If your monitor falls off in less than 4 days, contact our Monitor department at [phone].  If your monitor becomes loose or falls off after 4 days call [employer] at [phone] for  suggestions on securing your monitor    Follow-Up: At [facility], you and your health needs are our priority.  As part of our continuing mission to provide you with exceptional heart care, we have created designated Provider Care Teams.  These Care Teams include your primary Cardiologist (physician) and Advanced Practice Providers (APPs -  Physician Assistants and Nurse Practitioners) who all work together to provide you with the care you need, when you need it.  We recommend signing up for the patient portal called "MyChart".  Sign up information is provided on this After Visit Summary.  MyChart is used to connect with patients for Virtual Visits (Telemedicine).  Patients are able to view lab/test results, encounter notes, upcoming appointments, etc.  Non-urgent messages can be sent to your provider as well.   To learn more about what you can do with MyChart, go to [email or web address].    Your next appointment:   6 week(s)  The format for your next appointment:   In Person  Provider:   Fabian Sharp, PA-C    Then, Kirk Ruths, MD will plan to see you again in 3 month(s).      Important Information About Sugar         Signed, Ledora Bottcher, [place]  01/06/2022 3:17 PM    [facility]

## 2022-01-06 NOTE — Patient Instructions (Signed)
Medication Instructions:  Stop Crestor *If you need a refill on your cardiac medications before your next appointment, please call your pharmacy*   Lab Work: No Labs If you have labs (blood work) drawn today and your tests are completely normal, you will receive your results only by: [place] (if you have MyChart) OR A paper copy in the mail If you have any lab test that is abnormal or we need to change your treatment, we will call you to review the results.   Testing/Procedures: Bryn Gulling- Long Term Monitor Instructions  Your physician has requested you wear a ZIO patch monitor for 14 days.  This is a single patch monitor. [employer] supplies one patch monitor per enrollment. Additional stickers are not available. Please do not apply patch if you will be having a Nuclear Stress Test,  Echocardiogram, Cardiac CT, MRI, or Chest Xray during the period you would be wearing the  monitor. The patch cannot be worn during these tests. You cannot remove and re-apply the  ZIO XT patch monitor.  Your ZIO patch monitor will be mailed 3 day USPS to your address on file. It may take 3-5 days  to receive your monitor after you have been enrolled.  Once you have received your monitor, please review the enclosed instructions. Your monitor  has already been registered assigning a specific monitor serial # to you.  Billing and Patient Assistance Program Information  We have supplied [employer] with any of your insurance information on file for billing purposes. [employer] offers a sliding scale Patient Assistance Program for patients that do not have  insurance, or whose insurance does not completely cover the cost of the ZIO monitor.  You must apply for the Patient Assistance Program to qualify for this discounted rate.  To apply, please call [employer] at [phone], select option 4, select option 2, ask to apply for  Patient Assistance Program. Theodore Demark will ask your household income, and how many people   are in your household. They will quote your out-of-pocket cost based on that information.  [employer] will also be able to set up a 44-month interest-free payment plan if needed.  Applying the monitor   Shave hair from upper left chest.  Hold abrader disc by orange tab. Rub abrader in 40 strokes over the upper left chest as  indicated in your monitor instructions.  Clean area with 4 enclosed alcohol pads. Let dry.  Apply patch as indicated in monitor instructions. Patch will be placed under collarbone on left  side of chest with arrow pointing upward.  Rub patch adhesive wings for 2 minutes. Remove white label marked "1". Remove the white  label marked "2". Rub patch adhesive wings for 2 additional minutes.  While looking in a mirror, press and release button in center of patch. A small green light will  flash 3-4 times. This will be your only indicator that the monitor has been turned on.  Do not shower for the first 24 hours. You may shower after the first 24 hours.  Press the button if you feel a symptom. You will hear a small click. Record Date, Time and  Symptom in the Patient Logbook.  When you are ready to remove the patch, follow instructions on the last 2 pages of Patient  Logbook. Stick patch monitor onto the last page of Patient Logbook.  Place Patient Logbook in the blue and white box. Use locking tab on box and tape box closed  securely. The blue and white box  has prepaid postage on it. Please place it in the mailbox as  soon as possible. Your physician should have your test results approximately 7 days after the  monitor has been mailed back to [facility].  Call [place] at [phone] if you have questions regarding  your ZIO XT patch monitor. Call them immediately if you see an orange light blinking on your  monitor.  If your monitor falls off in less than 4 days, contact our Monitor department at [phone].  If your monitor becomes loose or  falls off after 4 days call [employer] at [phone] for  suggestions on securing your monitor    Follow-Up: At [facility], you and your health needs are our priority.  As part of our continuing mission to provide you with exceptional heart care, we have created designated Provider Care Teams.  These Care Teams include your primary Cardiologist (physician) and Advanced Practice Providers (APPs -  Physician Assistants and Nurse Practitioners) who all work together to provide you with the care you need, when you need it.  We recommend signing up for the patient portal called "MyChart".  Sign up information is provided on this After Visit Summary.  MyChart is used to connect with patients for Virtual Visits (Telemedicine).  Patients are able to view lab/test results, encounter notes, upcoming appointments, etc.  Non-urgent messages can be sent to your provider as well.   To learn more about what you can do with MyChart, go to [email or web address].    Your next appointment:   6 week(s)  The format for your next appointment:   In Person  Provider:   Fabian Sharp, PA-C    Then, Kirk Ruths, MD will plan to see you again in 3 month(s).      Important Information About Sugar

## 2022-01-09 DIAGNOSIS — I471 Supraventricular tachycardia: Secondary | ICD-10-CM | POA: Diagnosis not present

## 2022-01-09 DIAGNOSIS — R002 Palpitations: Secondary | ICD-10-CM | POA: Diagnosis not present

## 2022-01-09 DIAGNOSIS — K219 Gastro-esophageal reflux disease without esophagitis: Secondary | ICD-10-CM | POA: Diagnosis not present

## 2022-01-09 DIAGNOSIS — R079 Chest pain, unspecified: Secondary | ICD-10-CM

## 2022-01-09 DIAGNOSIS — R42 Dizziness and giddiness: Secondary | ICD-10-CM | POA: Diagnosis not present

## 2022-01-30 DIAGNOSIS — R42 Dizziness and giddiness: Secondary | ICD-10-CM | POA: Diagnosis not present

## 2022-01-30 DIAGNOSIS — K219 Gastro-esophageal reflux disease without esophagitis: Secondary | ICD-10-CM | POA: Diagnosis not present

## 2022-01-30 DIAGNOSIS — R002 Palpitations: Secondary | ICD-10-CM | POA: Diagnosis not present

## 2022-01-30 DIAGNOSIS — R079 Chest pain, unspecified: Secondary | ICD-10-CM | POA: Diagnosis not present

## 2022-02-09 NOTE — Progress Notes (Unsigned)
Cardiology Office Note:    Date:  02/09/2022   ID:  Jake Barnett, DOB [birth year], MRN [id number]  PCP:  Janora Norlander, [place] Providers Cardiologist:  Kirk Ruths, MD Cardiology APP:  Ledora Bottcher, PA { Click to update primary MD,subspecialty MD or APP then REFRESH:1}    Referring MD: Janora Norlander, DO   No chief complaint on file. ***  History of Present Illness:    Jake Barnett is a 81 y.o. male with a hx of palpitations and chest pain. Heart monitor 2009 with occasional PVC, PAC and short nonsustained atrial tachycardia. Stress echo 12/2008 with no ischemic but diastolic dysfunction. Exercise treadmill 06/2014 was negative for ischemia. Last seen in clinic 07/15/21 with Dr. Stanford Breed and was doing well. He continued to have non-bothersome palpitations.  I saw him in clinic 01/06/22 with dizziness and lightheadedness. ENT felt he did not have vertigo and vertigo therapy did not help. He also reported chest pain that sounded more consistent with GERD (CP with lying down, CP relieved with GERD medication). I opted to place a heart monitor, but did not feel strongly about an echo or ischemic evaluation. I advised a statin holiday as he felt his symptoms may have started after initiating crestor.   Heart monitor showed 38 SVT runs, longest run was 16 minutes. Predominant rhythm was sinus rhythm.   He returns today to discuss monitor results and symptoms.      PSVT Dizziness / lightheadedness - heart monitor showed primaryily sinus rhythm but 38 runs of SVT, longest run almost 17 minutes - will start metoprolol    Prior PACs, PVCs, Atrial tachycardia - has not been on a BB   Hx of chest pain of uncertain etiology Prior negative ischemic evaluations Feel his current symptoms are related to GERD He can walk 1-1.5 miles 4-5 times weekly without exertional chest pain   Hyperlipidemia  LDL prior to crestor was 25 Stopped crestor as he feels  some of his symptoms started after taking 10 mg crestor He completed a statin holiday -    Hypertension BP today ___ Hx of elevated BP, not on medications           Past Medical History:  Diagnosis Date   Arthritis    Cancer ([place])    hx of basal cell skin cancer    Cholelithiasis with chronic cholecystitis 12/04/2015   Chronic kidney disease    kidney stones   Difficult intubation 08/24/2015   GERD (gastroesophageal reflux disease)    H/O hiatal hernia    History of kidney stones    Hyperlipidemia    Left ureteral calculus    Mild diastolic dysfunction    AYMPTOMATIC   Palpitations    occasional PAC/PVC.   PONV (postoperative nausea and vomiting)    RBBB     Past Surgical History:  Procedure Laterality Date   ANTERIOR CERVICAL DECOMP/DISCECTOMY FUSION  2001   C5  -- C7- some limited hyperflexion of neck   CARDIOVASCULAR STRESS TEST  02-11-2011   DR CRENSHAW   NORMAL / NO ISCHEMIA/ EF 66%   CHOLECYSTECTOMY N/A 12/04/2015   Procedure: LAPAROSCOPIC CHOLECYSTECTOMY;  Surgeon: Fanny Skates, MD;  Location: [facility];  Service: General;  Laterality: N/A;   CYSTO/ BILATERAL LASER LITHOTRIPSY WITH STONE EXTRACTIONS  05-26-2011   CYSTOSCOPY W/ URETERAL STENT PLACEMENT Left 05/13/2013   Procedure: CYSTOSCOPY WITH RETROGRADE PYELOGRAM/URETERAL STENT PLACEMENT;  Surgeon: Bernestine Amass, MD;  Location: [facility];  Service: Urology;  Laterality: Left;   CYSTOSCOPY WITH RETROGRADE PYELOGRAM, URETEROSCOPY AND STENT PLACEMENT Left 06/15/2013   Procedure: STENT REMOVAL/CYSTOSCOPY//URETEROSCOPYSTONE EXTRACTION WITH BASKET;  Surgeon: Bernestine Amass, MD;  Location: [facility];  Service: Urology;  Laterality: Left;   HOLMIUM LASER APPLICATION Left 48/18/5631   Procedure: HOLMIUM LASER APPLICATION;  Surgeon: Bernestine Amass, MD;  Location: [facility];  Service: Urology;  Laterality: Left;   INGUINAL HERNIA REPAIR Bilateral 08/24/2015   Procedure: LAPAROSCOPIC BILATERAL INGUINAL  HERNIA REPAIR ;  Surgeon: Jackolyn Confer, MD;  Location: [facility];  Service: General;  Laterality: Bilateral;   INSERTION OF MESH Bilateral 08/24/2015   Procedure: WITH INSERTION OF MESH;  Surgeon: Jackolyn Confer, MD;  Location: [facility];  Service: General;  Laterality: Bilateral;   KNEE ARTHROSCOPY Right 2008   SHOULDER ARTHROSCOPY Right 2007   TOTAL KNEE ARTHROPLASTY Left 04/18/2013   Procedure: LEFT TOTAL KNEE ARTHROPLASTY;  Surgeon: Gearlean Alf, MD;  Location: [facility];  Service: Orthopedics;  Laterality: Left;   TRANSTHORACIC ECHOCARDIOGRAM  08-07-2008   NORMAL LVF/  EF 55-60%   TYMPANOPLASTY Right 1971   mild hearing loss    Current Medications: No outpatient medications have been marked as taking for the 02/10/22 encounter (Appointment) with Ledora Bottcher, [place].     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 They have a basement accessible from outdoors.   Daughter lives in [place].    Social Determinants of Health   Financial Resource Strain: Low Risk  (05/13/2021)   Overall Financial Resource Strain (CARDIA)    Difficulty of Paying Living Expenses: Not hard at all  Food Insecurity: No Food Insecurity (05/13/2021)   Hunger Vital Sign    Worried About Running Out of Food in the Last Year: Never true    Ran Out of Food in the Last Year: Never  true  Transportation Needs: No Transportation Needs (05/13/2021)   PRAPARE - Hydrologist (Medical): No    Lack of Transportation (Non-Medical): No  Physical Activity: Insufficiently Active (05/13/2021)   Exercise Vital Sign    Days of Exercise per Week: 7 days    Minutes of Exercise per Session: 10 min  Stress: No Stress Concern Present (05/13/2021)   [place]    Feeling of Stress : Only a little  Social Connections: Moderately Integrated (05/13/2021)   Social Connection and Isolation Panel [NHANES]    Frequency of Communication with Friends and Family: More than three times a week    Frequency of Social Gatherings with Friends and Family: Three times a week    Attends Religious Services: More than 4 times per year  Active Member of Clubs or Organizations: No    Attends Archivist Meetings: Never    Marital Status: Married     Family History: The patient's ***family history includes Aortic stenosis in his mother; Arthritis in his daughter and sister; Dementia in his father; Healthy in his daughter; Heart disease in his mother; Heart failure in his mother; Hypertension in his father and sister.  ROS:   Please see the history of present illness.    *** All other systems reviewed and are negative.  EKGs/Labs/Other Studies Reviewed:    The following studies were reviewed today: ***  EKG:  EKG is *** ordered today.  The ekg ordered today demonstrates ***  Recent Labs: 07/11/2021: ALT 9  Recent Lipid Panel    Component Value Date/Time   CHOL 189 03/28/2021 0849   TRIG 121 03/28/2021 0849   HDL 49 03/28/2021 0849   CHOLHDL 3.9 03/28/2021 0849   CHOLHDL 4 09/02/2012 1001   VLDL 23.6 09/02/2012 1001   LDLCALC 118 (H) 03/28/2021 0849   LDLDIRECT 52 07/11/2021 1518     Risk Assessment/Calculations:   {Does this patient have ATRIAL FIBRILLATION?[phone]}        Physical Exam:    VS:  There were no vitals taken for this visit.    Wt Readings from Last 3 Encounters:  01/06/22 212 lb (96.2 kg)  10/11/21 211 lb (95.7 kg)  07/15/21 208 lb 3.2 oz (94.4 kg)     GEN: *** Well nourished, well developed in no acute distress HEENT: Normal NECK: No JVD; No carotid bruits LYMPHATICS: No lymphadenopathy CARDIAC: ***RRR, no murmurs, rubs, gallops RESPIRATORY:  Clear to auscultation without rales, wheezing or rhonchi  ABDOMEN: Soft, non-tender, non-distended MUSCULOSKELETAL:  No edema; No deformity  SKIN: Warm and dry NEUROLOGIC:  Alert and oriented x 3 PSYCHIATRIC:  Normal affect   ASSESSMENT:    No diagnosis found. PLAN:    In order of problems listed above:  ***      {Are you ordering a CV Procedure (e.g. stress test, cath, DCCV, TEE, etc)?   Press F2        :[id number]}    Medication Adjustments/Labs and Tests Ordered: Current medicines are reviewed at length with the patient today.  Concerns regarding medicines are outlined above.  No orders of the defined types were placed in this encounter.  No orders of the defined types were placed in this encounter.   There are no Patient Instructions on file for this visit.   Signed, Ledora Bottcher, PA  02/09/2022 7:09 PM    [facility]

## 2022-02-10 ENCOUNTER — Encounter: Payer: Self-pay | Admitting: Physician Assistant

## 2022-02-10 ENCOUNTER — Ambulatory Visit (INDEPENDENT_AMBULATORY_CARE_PROVIDER_SITE_OTHER): Payer: Medicare Other | Admitting: Physician Assistant

## 2022-02-10 VITALS — BP 136/78 | HR 79 | Ht 71.0 in | Wt 209.0 lb

## 2022-02-10 DIAGNOSIS — R002 Palpitations: Secondary | ICD-10-CM | POA: Diagnosis not present

## 2022-02-10 DIAGNOSIS — I10 Essential (primary) hypertension: Secondary | ICD-10-CM

## 2022-02-10 DIAGNOSIS — R42 Dizziness and giddiness: Secondary | ICD-10-CM

## 2022-02-10 DIAGNOSIS — E7800 Pure hypercholesterolemia, unspecified: Secondary | ICD-10-CM

## 2022-02-10 DIAGNOSIS — I471 Supraventricular tachycardia: Secondary | ICD-10-CM | POA: Diagnosis not present

## 2022-02-10 NOTE — Patient Instructions (Signed)
Medication Instructions:  No Changes *If you need a refill on your cardiac medications before your next appointment, please call your pharmacy*   Lab Work: No Labs If you have labs (blood work) drawn today and your tests are completely normal, you will receive your results only by: [place] (if you have MyChart) OR A paper copy in the mail If you have any lab test that is abnormal or we need to change your treatment, we will call you to review the results.   Testing/Procedures: No Testing   Follow-Up: At [facility], you and your health needs are our priority.  As part of our continuing mission to provide you with exceptional heart care, we have created designated Provider Care Teams.  These Care Teams include your primary Cardiologist (physician) and Advanced Practice Providers (APPs -  Physician Assistants and Nurse Practitioners) who all work together to provide you with the care you need, when you need it.  We recommend signing up for the patient portal called "MyChart".  Sign up information is provided on this After Visit Summary.  MyChart is used to connect with patients for Virtual Visits (Telemedicine).  Patients are able to view lab/test results, encounter notes, upcoming appointments, etc.  Non-urgent messages can be sent to your provider as well.   To learn more about what you can do with MyChart, go to [email or web address].    Your next appointment:   4 month(s)  The format for your next appointment:   In Person  Provider:   Kirk Ruths, MD       Important Information About Sugar

## 2022-03-17 ENCOUNTER — Other Ambulatory Visit: Payer: Self-pay | Admitting: Family Medicine

## 2022-03-17 DIAGNOSIS — K219 Gastro-esophageal reflux disease without esophagitis: Secondary | ICD-10-CM

## 2022-03-21 ENCOUNTER — Ambulatory Visit: Payer: Medicare Other | Admitting: Family Medicine

## 2022-03-29 ENCOUNTER — Other Ambulatory Visit: Payer: Self-pay | Admitting: Family Medicine

## 2022-04-09 ENCOUNTER — Ambulatory Visit: Payer: Medicare Other | Admitting: Cardiology

## 2022-04-10 ENCOUNTER — Other Ambulatory Visit: Payer: Self-pay | Admitting: Family Medicine

## 2022-04-10 DIAGNOSIS — K219 Gastro-esophageal reflux disease without esophagitis: Secondary | ICD-10-CM

## 2022-05-08 ENCOUNTER — Other Ambulatory Visit: Payer: Self-pay | Admitting: Family Medicine

## 2022-05-08 DIAGNOSIS — K219 Gastro-esophageal reflux disease without esophagitis: Secondary | ICD-10-CM

## 2022-05-08 MED ORDER — OMEPRAZOLE 20 MG PO CPDR: 20.0000 mg | DELAYED_RELEASE_CAPSULE | Freq: Every day | ORAL | 0 refills | Status: DC

## 2022-05-08 NOTE — Telephone Encounter (Signed)
Patient has appt 06-02-22 with DOD

## 2022-05-08 NOTE — Addendum Note (Signed)
Addended by: Antonietta Barcelona D on: 05/08/2022 11:35 AM   Modules accepted: Orders

## 2022-05-08 NOTE — Telephone Encounter (Signed)
gottsch[place] NTBS 30 days given 04/10/22

## 2022-05-14 ENCOUNTER — Encounter (INDEPENDENT_AMBULATORY_CARE_PROVIDER_SITE_OTHER): Payer: Medicare Other

## 2022-06-02 ENCOUNTER — Encounter: Payer: Self-pay | Admitting: Family Medicine

## 2022-06-02 ENCOUNTER — Ambulatory Visit (INDEPENDENT_AMBULATORY_CARE_PROVIDER_SITE_OTHER): Payer: Medicare Other | Admitting: Family Medicine

## 2022-06-02 VITALS — BP 136/79 | HR 73 | Temp 98.2°F | Ht 71.0 in | Wt 209.0 lb

## 2022-06-02 DIAGNOSIS — I5189 Other ill-defined heart diseases: Secondary | ICD-10-CM

## 2022-06-02 DIAGNOSIS — K219 Gastro-esophageal reflux disease without esophagitis: Secondary | ICD-10-CM | POA: Diagnosis not present

## 2022-06-02 DIAGNOSIS — Z125 Encounter for screening for malignant neoplasm of prostate: Secondary | ICD-10-CM

## 2022-06-02 DIAGNOSIS — Z23 Encounter for immunization: Secondary | ICD-10-CM

## 2022-06-02 DIAGNOSIS — E7800 Pure hypercholesterolemia, unspecified: Secondary | ICD-10-CM

## 2022-06-02 DIAGNOSIS — Z0000 Encounter for general adult medical examination without abnormal findings: Secondary | ICD-10-CM

## 2022-06-02 NOTE — Progress Notes (Deleted)
Jake Barnett is a 81 y.o. male presents to office today for annual physical exam examination.    Concerns today include: 1. ***  Occupation: ***, Marital status: ***, Substance use: *** Diet: ***, Exercise: *** Last eye exam: *** Last dental exam: *** Last colonoscopy: *** Last mammogram: *** Last pap smear: *** Refills needed today: *** Immunizations needed: Immunization History  Administered Date(s) Administered   Fluad Quad(high Dose 65+) 05/26/2019, 06/05/2020   Influenza, High Dose Seasonal PF 05/27/2016, 06/09/2017, 05/19/2018, 05/23/2021   Influenza,inj,Quad PF,6+ Mos 06/20/2013, 05/22/2015   PFIZER(Purple Top)SARS-COV-2 Vaccination 08/25/2019, 09/15/2019, 04/28/2020, 12/24/2020   Pneumococcal Conjugate-13 07/06/2019   Pneumococcal Polysaccharide-23 07/16/2017   Tdap 07/06/2019   Zoster Recombinat (Shingrix) 03/25/2021     Past Medical History:  Diagnosis Date   Arthritis    Cancer ([place])    hx of basal cell skin cancer    Cholelithiasis with chronic cholecystitis 12/04/2015   Chronic kidney disease    kidney stones   Difficult intubation 08/24/2015   GERD (gastroesophageal reflux disease)    H/O hiatal hernia    History of kidney stones    HTN (hypertension) 12/16/2010   Hyperlipidemia    Left ureteral calculus    Mild diastolic dysfunction    AYMPTOMATIC   Palpitations    occasional PAC/PVC.   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 They have a basement accessible from outdoors.   Daughter lives in [place].    Social Determinants of Health   Financial Resource Strain: Low Risk  (05/13/2021)   Overall Financial Resource Strain (CARDIA)    Difficulty of Paying Living Expenses: Not hard at all  Food Insecurity: No Food Insecurity (05/13/2021)   Hunger Vital Sign    Worried About Running Out of Food in the Last Year: Never true    Ran Out of Food in the Last Year: Never true  Transportation Needs: No Transportation Needs (05/13/2021)   PRAPARE - Hydrologist (Medical): No    Lack of Transportation (Non-Medical): No  Physical Activity: Insufficiently Active (05/13/2021)   Exercise Vital Sign    Days of Exercise per Week: 7 days    Minutes of Exercise per Session: 10 min  Stress: No Stress Concern Present (05/13/2021)   [place]    Feeling of Stress : Only a little  Social Connections: Moderately Integrated (05/13/2021)   Social Connection and Isolation Panel [NHANES]    Frequency of Communication with Friends and Family: More than three times a week    Frequency of Social Gatherings with Friends and Family: Three times  a week    Attends Religious Services: More than 4 times per year    Active Member of Clubs or Organizations: No    Attends Archivist Meetings: Never    Marital Status: Married  Human resources officer Violence: Not At Risk (05/13/2021)   Humiliation, Afraid, Rape, and Kick questionnaire    Fear of Current or Ex-Partner: No    Emotionally Abused: No    Physically Abused: No    Sexually Abused: No   Past Surgical History:  Procedure Laterality Date   ANTERIOR CERVICAL DECOMP/DISCECTOMY FUSION  2001   C5  -- C7- some  limited hyperflexion of neck   CARDIOVASCULAR STRESS TEST  02-11-2011   DR CRENSHAW   NORMAL / NO ISCHEMIA/ EF 66%   CHOLECYSTECTOMY N/A 12/04/2015   Procedure: LAPAROSCOPIC CHOLECYSTECTOMY;  Surgeon: Fanny Skates, MD;  Location: [facility];  Service: General;  Laterality: N/A;   CYSTO/ BILATERAL LASER LITHOTRIPSY WITH STONE EXTRACTIONS  05-26-2011   CYSTOSCOPY W/ URETERAL STENT PLACEMENT Left 05/13/2013   Procedure: CYSTOSCOPY WITH RETROGRADE PYELOGRAM/URETERAL STENT PLACEMENT;  Surgeon: Bernestine Amass, MD;  [facility]: [facility];  Service: Urology;  Laterality: Left;   CYSTOSCOPY WITH RETROGRADE PYELOGRAM, URETEROSCOPY AND STENT PLACEMENT Left 06/15/2013   Procedure: STENT REMOVAL/CYSTOSCOPY//URETEROSCOPYSTONE EXTRACTION WITH BASKET;  Surgeon: Bernestine Amass, MD;  Location: [facility];  Service: Urology;  Laterality: Left;   HOLMIUM LASER APPLICATION Left 98/92/1194   Procedure: HOLMIUM LASER APPLICATION;  Surgeon: Bernestine Amass, MD;  Location[facility];  Service: Urology;  Laterality: Left;   INGUINAL HERNIA REPAIR Bilateral 08/24/2015   Procedure: LAPAROSCOPIC BILATERAL INGUINAL HERNIA REPAIR ;  Surgeon: Jackolyn Confer, MD;  Location: [facility];  Service: General;  Laterality: Bilateral;   INSERTION OF MESH Bilateral 08/24/2015   Procedure: WITH INSERTION OF MESH;  Surgeon: Jackolyn Confer, MD;  Location: [facility];  Service: General;  Laterality: Bilateral;   KNEE ARTHROSCOPY Right 2008   SHOULDER ARTHROSCOPY Right 2007   TOTAL KNEE ARTHROPLASTY Left 04/18/2013   Procedure: LEFT TOTAL KNEE ARTHROPLASTY;  Surgeon: Gearlean Alf, MD;  Location: [facility];  Service: Orthopedics;  Laterality: Left;   TRANSTHORACIC ECHOCARDIOGRAM  08-07-2008   NORMAL LVF/  EF 55-60%   TYMPANOPLASTY Right 1971   mild hearing loss   Family History  Problem Relation Age of Onset   Heart failure Mother    Aortic stenosis Mother    Heart disease Mother    Hypertension Father    Dementia Father    Arthritis  Sister    Hypertension Sister    Healthy Daughter    Arthritis Daughter     Current Outpatient Medications:    cholecalciferol (VITAMIN D3) 25 MCG (1000 UNIT) tablet, Take 1,000 Units by mouth daily., Disp: , Rfl:    omeprazole (PRILOSEC) 20 MG capsule, Take 1 capsule (20 mg total) by mouth daily., Disp: 30 capsule, Rfl: 0  Allergies  Allergen Reactions   Indomethacin Nausea Only and Other (See Comments)    Stomach ache   Indomethacin    Statins Other (See Comments)    Muscle pain, fatigue and dizziness     ROS: Review of Systems {ros; complete:30496}    Physical exam {Exam, Complete:[phone]}    Assessment/ Plan: [name] here for annual physical exam.   No problem-specific Assessment & Plan notes found for this encounter.   Counseled on healthy lifestyle choices, including diet (rich in fruits, vegetables and lean meats and low in salt and simple  carbohydrates) and exercise (at least 30 minutes of moderate physical activity daily).  Patient to follow up in 1 year for annual exam or sooner if needed.  Gessica Jawad M. Lajuana Ripple, DO

## 2022-06-02 NOTE — Progress Notes (Signed)
I have separately seen and examined the patient. I have discussed the findings and exam with student Dr Jerline Pain and agree with the below note.  My changes/additions are outlined in BLUE.    S: Patient reports that he is doing very well.  He really has no concerns today.  He is compliant with his omeprazole but discontinued the statin because this was causing adverse side effects.  Apparently this was approved by his cardiologist, whom he follows with closely.  Sees dermatology yearly.  O: Vitals:   06/02/22 0922 06/02/22 0930  BP: 132/76 136/79  Pulse: 73   Temp: 98.2 F (36.8 C)   SpO2: 95%     BP 136/79   Pulse 73   Temp 98.2 F (36.8 C)   Ht _0  (1.803 m)   Wt 209 lb (94.8 kg)   SpO2 95%   BMI 29.15 kg/m  General appearance: alert, cooperative, appears stated age, and no distress Head: Normocephalic, without obvious abnormality, atraumatic Eyes: negative findings: lids and lashes normal, conjunctivae and sclerae normal, corneas clear, and pupils equal, round, reactive to light and accomodation Ears: normal TM's and external ear canals both ears Nose: Nares normal. Septum midline. Mucosa normal. No drainage or sinus tenderness. Throat: lips, mucosa, and tongue normal; teeth and gums normal Neck: no adenopathy, no carotid bruit, supple, symmetrical, trachea midline, and thyroid not enlarged, symmetric, no tenderness/mass/nodules Back: symmetric, no curvature. ROM normal. No CVA tenderness. Lungs: clear to auscultation bilaterally Chest wall: no tenderness Heart: regular rate and rhythm, S1, S2 normal, no murmur, click, rub or gallop Abdomen: soft, non-tender; bowel sounds normal; no masses,  no organomegaly; diastases recti present Extremities: extremities normal, atraumatic, no cyanosis or edema Pulses: 2+ and symmetric Skin: Skin color, texture, turgor normal. No rashes or lesions Lymph nodes: Cervical, supraclavicular, and axillary nodes normal. Neurologic: Grossly  normal Psych: Very pleasant, interactive male in no acute distress.     06/02/2022    9:31 AM 07/11/2021    2:34 PM 05/13/2021    4:22 PM  Depression screen PHQ 2/9  Decreased Interest 0 0 0  Down, Depressed, Hopeless 0 0 0  PHQ - 2 Score 0 0 0  Altered sleeping  0 0  Tired, decreased energy  0 0  Change in appetite  0 0  Feeling bad or failure about yourself   0 0  Trouble concentrating  0 0  Moving slowly or fidgety/restless  0 0  Suicidal thoughts  0 0  PHQ-9 Score  0 0  Difficult doing work/chores  Not difficult at all Not difficult at all      06/02/2022    9:31 AM 07/11/2021    2:34 PM 03/25/2021    3:09 PM 02/01/2021    8:04 AM  GAD 7 : Generalized Anxiety Score  Nervous, Anxious, on Edge 0 0 0 0  Control/stop worrying 0 0 0 0  Worry too much - different things 0 0 0 0  Trouble relaxing 0 0 0 0  Restless 0 0 0 0  Easily annoyed or irritable 0 0 0 0  Afraid - awful might happen 0 0 0 0  Total GAD 7 Score 0 0 0 0  Anxiety Difficulty Not difficult at all Not difficult at all Not difficult at all Not difficult at all    A/P:  Pure hypercholesterolemia - Plan: CMP14+EGFR, Lipid Panel, TSH  Annual physical exam  Diastolic dysfunction - Plan: CMP14+EGFR, Lipid Panel, TSH, CBC  Gastroesophageal reflux  disease without esophagitis - Plan: CBC  Screening for malignant neoplasm of prostate  Pure hypercholesterolemia - Plan: CMP14+EGFR, Lipid Panel, TSH  Annual physical exam  Diastolic dysfunction - Plan: CMP14+EGFR, Lipid Panel, TSH, CBC  Gastroesophageal reflux disease without esophagitis - Plan: CBC  Screening for malignant neoplasm of prostate  Check fasting lipid, CMP.  Off of statin due to statin induced myalgia.  May need to consider alternative therapy, zetia vs Repatha?  No evidence of fluid overload.  Asymptomatic from a pulmonary and cardiac standpoint  GERD is stable.  Check CBC given chronic use of PPI.  Sees urology.  PSA was not collected today  because his insurance would not cover.  Continue to follow-up with specialist for ongoing examinations  Second shingles vaccination administered  Gavriella Hearst M. Lajuana Ripple, DO [facility]   -------------------------------------------------------------------------------------------------------------------------------------------------------------------------------------      Jake Barnett is a 81 y.o. male presents to office today for annual physical exam examination.  Patient reports that overall things are going well. No changes to family history. His sister lives in town.   Concerns today include: Blood Pressure Control: No concerns at this time. The patient monitors blood pressure at home on a daily basis. Over the past week, the recorded at-home blood pressure readings have consistently fallen within the range of 979-892 mm Hg systolic and 11-94 mm Hg diastolic  Dizziness: Patient discontinued physical therapy because it was not effective.. Vertigo was ruled out as a potential cause. Additionally, cardiology evaluation did not reveal any contributory heart conditions. The patient reported an improvement in symptoms after discontinuing statin medication and noted further improvement with the use of over-the-counter antihistamines for a limited duration.  Hypercholesterolemia The patient's hepatic function panel results from 2022 were unremarkable, and his LDL levels were within the normal range during that time. The patient is not currently prescribed statin medication and maintains regular follow-up with a cardiology  Gastroesophageal Reflux Disease The patient has reported that symptoms of GERD have been effectively managed with omeprazole. There are currently no concerns related to GERD  Occupation: Retired from [employer] position. Marital status: married, Substance use: Never Diet: "Whatever I want", Exercise: "not as much as I should be" Last eye exam:  within the last year Last dental exam: within the last year Last colonoscopy: Due in December 2025 Refills needed today: Omeprazole Immunizations needed:  Immunization History  Administered Date(s) Administered   Fluad Quad(high Dose 65+) 05/26/2019, 06/05/2020   Influenza, High Dose Seasonal PF 05/27/2016, 06/09/2017, 05/19/2018, 05/23/2021   Influenza,inj,Quad PF,6+ Mos 06/20/2013, 05/22/2015   PFIZER(Purple Top)SARS-COV-2 Vaccination 08/25/2019, 09/15/2019, 04/28/2020, 12/24/2020   Pneumococcal Conjugate-13 07/06/2019   Pneumococcal Polysaccharide-23 07/16/2017   Tdap 07/06/2019   Zoster Recombinat (Shingrix) 03/25/2021     Past Medical History:  Diagnosis Date   Arthritis    Cancer ([place])    hx of basal cell skin cancer    Cholelithiasis with chronic cholecystitis 12/04/2015   Chronic kidney disease    kidney stones   Difficult intubation 08/24/2015   GERD (gastroesophageal reflux disease)    H/O hiatal hernia    History of kidney stones    HTN (hypertension) 12/16/2010   Hyperlipidemia    Left ureteral calculus    Mild diastolic dysfunction    AYMPTOMATIC   Palpitations    occasional PAC/PVC.   PONV (postoperative nausea and vomiting)    Postoperative anemia due to acute blood loss 04/19/2013   RBBB    Social History   Socioeconomic  History   Marital status: Married    Spouse name: Ennis Forts   Number of children: 1   Years of education: 13   Highest education level: Some college, no degree  Occupational History   Occupation: Quarry manager: [employer]    Comment: Retired   Occupation: Freight forwarder    Comment: for 13 years  Tobacco Use   Smoking status: Never   Smokeless tobacco: Never  Vaping Use   Vaping Use: Never used  Substance and Sexual Activity   Alcohol use: No   Drug use: No   Sexual activity: Not Currently  Other Topics Concern   Not on file  Social History Narrative   One level living with his wife. They have a basement accessible from  outdoors.   Daughter lives in [place].    Social Determinants of Health   Financial Resource Strain: Low Risk  (05/13/2021)   Overall Financial Resource Strain (CARDIA)    Difficulty of Paying Living Expenses: Not hard at all  Food Insecurity: No Food Insecurity (05/13/2021)   Hunger Vital Sign    Worried About Running Out of Food in the Last Year: Never true    Ran Out of Food in the Last Year: Never true  Transportation Needs: No Transportation Needs (05/13/2021)   PRAPARE - Hydrologist (Medical): No    Lack of Transportation (Non-Medical): No  Physical Activity: Insufficiently Active (05/13/2021)   Exercise Vital Sign    Days of Exercise per Week: 7 days    Minutes of Exercise per Session: 10 min  Stress: No Stress Concern Present (05/13/2021)   [place]    Feeling of Stress : Only a little  Social Connections: Moderately Integrated (05/13/2021)   Social Connection and Isolation Panel [NHANES]    Frequency of Communication with Friends and Family: More than three times a week    Frequency of Social Gatherings with Friends and Family: Three times a week    Attends Religious Services: More than 4 times per year    Active Member of Clubs or Organizations: No    Attends Archivist Meetings: Never    Marital Status: Married  Human resources officer Violence: Not At Risk (05/13/2021)   Humiliation, Afraid, Rape, and Kick questionnaire    Fear of Current or Ex-Partner: No    Emotionally Abused: No    Physically Abused: No    Sexually Abused: No   Past Surgical History:  Procedure Laterality Date   ANTERIOR CERVICAL DECOMP/DISCECTOMY FUSION  2001   C5  -- C7- some limited hyperflexion of neck   CARDIOVASCULAR STRESS TEST  02-11-2011   DR CRENSHAW   NORMAL / NO ISCHEMIA/ EF 66%   CHOLECYSTECTOMY N/A 12/04/2015   Procedure: LAPAROSCOPIC CHOLECYSTECTOMY;  Surgeon: Fanny Skates,  MD;  Location: [facility];  Service: General;  Laterality: N/A;   CYSTO/ BILATERAL LASER LITHOTRIPSY WITH STONE EXTRACTIONS  05-26-2011   CYSTOSCOPY W/ URETERAL STENT PLACEMENT Left 05/13/2013   Procedure: CYSTOSCOPY WITH RETROGRADE PYELOGRAM/URETERAL STENT PLACEMENT;  Surgeon: Bernestine Amass, MD;  Location: [facility];  Service: Urology;  Laterality: Left;   CYSTOSCOPY WITH RETROGRADE PYELOGRAM, URETEROSCOPY AND STENT PLACEMENT Left 06/15/2013   Procedure: STENT REMOVAL/CYSTOSCOPY//URETEROSCOPYSTONE EXTRACTION WITH BASKET;  Surgeon: Bernestine Amass, MD;  Location: [facility];  Service: Urology;  Laterality: Left;   HOLMIUM LASER APPLICATION Left 81/08/7508   Procedure: HOLMIUM LASER APPLICATION;  Surgeon: Camelia Eng  Risa Grill, MD;  Location: [facility];  Service: Urology;  Laterality: Left;   INGUINAL HERNIA REPAIR Bilateral 08/24/2015   Procedure: LAPAROSCOPIC BILATERAL INGUINAL HERNIA REPAIR ;  Surgeon: Jackolyn Confer, MD;  Location: [facility];  Service: General;  Laterality: Bilateral;   INSERTION OF MESH Bilateral 08/24/2015   Procedure: WITH INSERTION OF MESH;  Surgeon: Jackolyn Confer, MD;  Location: [facility];  Service: General;  Laterality: Bilateral;   KNEE ARTHROSCOPY Right 2008   SHOULDER ARTHROSCOPY Right 2007   TOTAL KNEE ARTHROPLASTY Left 04/18/2013   Procedure: LEFT TOTAL KNEE ARTHROPLASTY;  Surgeon: Gearlean Alf, MD;  Location: [facility];  Service: Orthopedics;  Laterality: Left;   TRANSTHORACIC ECHOCARDIOGRAM  08-07-2008   NORMAL LVF/  EF 55-60%   TYMPANOPLASTY Right 1971   mild hearing loss   Family History  Problem Relation Age of Onset   Heart failure Mother    Aortic stenosis Mother    Heart disease Mother    Hypertension Father    Dementia Father    Arthritis Sister    Hypertension Sister    Healthy Daughter    Arthritis Daughter     Current Outpatient Medications:    cholecalciferol (VITAMIN D3) 25 MCG (1000 UNIT) tablet, Take 1,000 Units by mouth daily., Disp: , Rfl:     omeprazole (PRILOSEC) 20 MG capsule, Take 1 capsule (20 mg total) by mouth daily., Disp: 30 capsule, Rfl: 0  Allergies  Allergen Reactions   Indomethacin Nausea Only and Other (See Comments)    Stomach ache   Indomethacin    Statins Other (See Comments)    Muscle pain, fatigue and dizziness     ROS: Review of Systems Pertinent items noted in HPI and remainder of comprehensive ROS otherwise negative.    Physical exam BP 136/79   Pulse 73   Temp 98.2 F (36.8 C)   Ht _0  (1.803 m)   Wt 209 lb (94.8 kg)   SpO2 95%   BMI 29.15 kg/m  General appearance: alert, cooperative, and appears stated age Eyes: conjunctivae/corneas clear. PERRL, EOM's intact. Fundi benign. Ears: normal TM's and external ear canals both ears Nose: Nares normal. Septum midline. Mucosa normal. No drainage or sinus tenderness. Throat: lips, mucosa, and tongue normal; teeth and gums normal Neck: no adenopathy, no carotid bruit, no JVD, supple, symmetrical, trachea midline, and thyroid not enlarged, symmetric, no tenderness/mass/nodules Lungs: clear to auscultation bilaterally and normal percussion bilaterally Heart: regular rate and rhythm, S1, S2 normal, no murmur, click, rub or gallop Abdomen: soft, non-tender; bowel sounds normal; no masses,  no organomegaly Extremities: extremities normal, atraumatic, no cyanosis or edema Pulses: 2+ and symmetric Skin: Skin color, texture, turgor normal. No rashes or lesions    Assessment/ Plan: [name] here for annual physical exam  The patient's blood pressures remain stable at this time, with no immediate concerns in this regard. The patient maintains regular appointments with specialists, including cardiology, dermatology, and dentistry.  To monitor chronic conditions, labs were ordered including a PSA, CBC, TSH, Lipid Panel, and CMP.   Counseled on healthy lifestyle choices, including diet (rich in fruits, vegetables and lean meats and low in salt and  simple carbohydrates) and exercise (at least 30 minutes of moderate physical activity daily).  Patient to follow up in 1 year for annual exam or sooner if needed.  Stephani Police, MS3

## 2022-06-02 NOTE — Patient Instructions (Signed)
You had labs performed today.  You will be contacted with the results of the labs once they are available, usually in the next 3 business days for routine lab work.  If you have an active my chart account, they will be released to your MyChart.  If you prefer to have these labs released to you via telephone, please let us know.    Preventive Care 29 Years and Older, Male Preventive care refers to lifestyle choices and visits with your health care provider that can promote health and wellness. Preventive care visits are also called wellness exams. What can I expect for my preventive care visit? Counseling During your preventive care visit, your health care provider may ask about your: Medical history, including: Past medical problems. Family medical history. History of falls. Current health, including: Emotional well-being. Home life and relationship well-being. Sexual activity. Memory and ability to understand (cognition). Lifestyle, including: Alcohol, nicotine or tobacco, and drug use. Access to firearms. Diet, exercise, and sleep habits. Work and work Statistician. Sunscreen use. Safety issues such as seatbelt and bike helmet use. Physical exam Your health care provider will check your: Height and weight. These may be used to calculate your BMI (body mass index). BMI is a measurement that tells if you are at a healthy weight. Waist circumference. This measures the distance around your waistline. This measurement also tells if you are at a healthy weight and may help predict your risk of certain diseases, such as type 2 diabetes and high blood pressure. Heart rate and blood pressure. Body temperature. Skin for abnormal spots. What immunizations do I need?  Vaccines are usually given at various ages, according to a schedule. Your health care provider will recommend vaccines for you based on your age, medical history, and lifestyle or other factors, such as travel or where you  work. What tests do I need? Screening Your health care provider may recommend screening tests for certain conditions. This may include: Lipid and cholesterol levels. Diabetes screening. This is done by checking your blood sugar (glucose) after you have not eaten for a while (fasting). Hepatitis C test. Hepatitis B test. HIV (human immunodeficiency virus) test. STI (sexually transmitted infection) testing, if you are at risk. Lung cancer screening. Colorectal cancer screening. Prostate cancer screening. Abdominal aortic aneurysm (AAA) screening. You may need this if you are a current or former smoker. Talk with your health care provider about your test results, treatment options, and if necessary, the need for more tests. Follow these instructions at home: Eating and drinking  Eat a diet that includes fresh fruits and vegetables, whole grains, lean protein, and low-fat dairy products. Limit your intake of foods with high amounts of sugar, saturated fats, and salt. Take vitamin and mineral supplements as recommended by your health care provider. Do not drink alcohol if your health care provider tells you not to drink. If you drink alcohol: Limit how much you have to 0-2 drinks a day. Know how much alcohol is in your drink. In the [place] one drink equals one 12 oz bottle of beer (355 mL), one 5 oz glass of wine (148 mL), or one 1 oz glass of hard liquor (44 mL). Lifestyle Brush your teeth every morning and night with fluoride toothpaste. Floss one time each day. Exercise for at least 30 minutes 5 or more days each week. Do not use any products that contain nicotine or tobacco. These products include cigarettes, chewing tobacco, and vaping devices, such as e-cigarettes. If you need  help quitting, ask your health care provider. Do not use drugs. If you are sexually active, practice safe sex. Use a condom or other form of protection to prevent STIs. Take aspirin only as told by your health  care provider. Make sure that you understand how much to take and what form to take. Work with your health care provider to find out whether it is safe and beneficial for you to take aspirin daily. Ask your health care provider if you need to take a cholesterol-lowering medicine (statin). Find healthy ways to manage stress, such as: Meditation, yoga, or listening to music. Journaling. Talking to a trusted person. Spending time with friends and family. Safety Always wear your seat belt while driving or riding in a vehicle. Do not drive: If you have been drinking alcohol. Do not ride with someone who has been drinking. When you are tired or distracted. While texting. If you have been using any mind-altering substances or drugs. Wear a helmet and other protective equipment during sports activities. If you have firearms in your house, make sure you follow all gun safety procedures. Minimize exposure to UV radiation to reduce your risk of skin cancer. What's next? Visit your health care provider once a year for an annual wellness visit. Ask your health care provider how often you should have your eyes and teeth checked. Stay up to date on all vaccines. This information is not intended to replace advice given to you by your health care provider. Make sure you discuss any questions you have with your health care provider. Document Revised: 01/16/2021 Document Reviewed: 01/16/2021 Elsevier Patient Education  [place].

## 2022-06-03 ENCOUNTER — Encounter: Payer: Medicare Other | Admitting: Family Medicine

## 2022-06-03 LAB — CBC
Hematocrit: 45.9 % (ref 37.5–51.0)
Hemoglobin: 15.7 g/dL (ref 13.0–17.7)
MCH: 29.6 pg (ref 26.6–33.0)
MCHC: 34.2 g/dL (ref 31.5–35.7)
MCV: 86 fL (ref 79–97)
Platelets: 236 10*3/uL (ref 150–450)
RBC: 5.31 x10E6/uL (ref 4.14–5.80)
RDW: 13.5 % (ref 11.6–15.4)
WBC: 7 10*3/uL (ref 3.4–10.8)

## 2022-06-03 LAB — CMP14+EGFR
ALT: 8 IU/L (ref 0–44)
AST: 17 IU/L (ref 0–40)
Albumin/Globulin Ratio: 1.8 (ref 1.2–2.2)
Albumin: 4.2 g/dL (ref 3.8–4.8)
Alkaline Phosphatase: 94 IU/L (ref 44–121)
BUN/Creatinine Ratio: 13 (ref 10–24)
BUN: 15 mg/dL (ref 8–27)
Bilirubin Total: 1.2 mg/dL (ref 0.0–1.2)
CO2: 24 mmol/L (ref 20–29)
Calcium: 9 mg/dL (ref 8.6–10.2)
Chloride: 104 mmol/L (ref 96–106)
Creatinine, Ser: 1.15 mg/dL (ref 0.76–1.27)
Globulin, Total: 2.3 g/dL (ref 1.5–4.5)
Glucose: 104 mg/dL — ABNORMAL HIGH (ref 70–99)
Potassium: 4.3 mmol/L (ref 3.5–5.2)
Sodium: 142 mmol/L (ref 134–144)
Total Protein: 6.5 g/dL (ref 6.0–8.5)
eGFR: 64 mL/min/{1.73_m2} (ref >59)

## 2022-06-03 LAB — LIPID PANEL
Chol/HDL Ratio: 4.4 ratio (ref 0.0–5.0)
Cholesterol, Total: 184 mg/dL (ref 100–199)
HDL: 42 mg/dL (ref >39)
LDL Chol Calc (NIH): 111 mg/dL — ABNORMAL HIGH (ref 0–99)
Triglycerides: 175 mg/dL — ABNORMAL HIGH (ref 0–149)
VLDL Cholesterol Cal: 31 mg/dL (ref 5–40)

## 2022-06-03 LAB — TSH: TSH: 1.68 u[IU]/mL (ref 0.450–4.500)

## 2022-06-07 ENCOUNTER — Other Ambulatory Visit: Payer: Self-pay | Admitting: Family Medicine

## 2022-06-07 DIAGNOSIS — K219 Gastro-esophageal reflux disease without esophagitis: Secondary | ICD-10-CM

## 2022-07-01 NOTE — Progress Notes (Signed)
HPI: FU palpitations and chest pain. Stress echocardiogram in May of 3299 showed diastolic dysfunction but no ECG changes and no wall motion abnormalities. Patient had a Myoview in July of 2012 and showed an ejection fraction of 66% and normal perfusion. Exercise treadmill November 2015 was negative. Monitor June 2023 showed sinus rhythm with occasional PAC, runs of SVT, occasional PVC and rare couplet. Since last seen, patient denies dyspnea, chest pain, palpitations or syncope.  Note his rosuvastatin was discontinued previously due to muscle aches, fatigue and dizziness.  Current Outpatient Medications  Medication Sig Dispense Refill   Cholecalciferol (DIALYVITE VITAMIN D 5000) 125 MCG (5000 UT) capsule Take 5,000 Units by mouth daily.     omeprazole (PRILOSEC) 20 MG capsule TAKE 1 CAPSULE BY MOUTH EVERY DAY 90 capsule 1   No current facility-administered medications for this visit.     Past Medical History:  Diagnosis Date   Arthritis    Cancer ([place])    hx of basal cell skin cancer    Cholelithiasis with chronic cholecystitis 12/04/2015   Chronic kidney disease    kidney stones   Difficult intubation 08/24/2015   GERD (gastroesophageal reflux disease)    H/O hiatal hernia    History of kidney stones    HTN (hypertension) 12/16/2010   Hyperlipidemia    Left ureteral calculus    Mild diastolic dysfunction    AYMPTOMATIC   Palpitations    occasional PAC/PVC.   PONV (postoperative nausea and vomiting)    Postoperative anemia due to acute blood loss 04/19/2013   RBBB     Past Surgical History:  Procedure Laterality Date   ANTERIOR CERVICAL DECOMP/DISCECTOMY FUSION  2001   C5  -- C7- some limited hyperflexion of neck   CARDIOVASCULAR STRESS TEST  02-11-2011   DR Caster [place]   NORMAL / NO ISCHEMIA/ EF 66%   CHOLECYSTECTOMY N/A 12/04/2015   Procedure: LAPAROSCOPIC CHOLECYSTECTOMY;  Surgeon: Fanny Skates, MD;  Location: [facility];  Service: General;  Laterality: N/A;   CYSTO/  BILATERAL LASER LITHOTRIPSY WITH STONE EXTRACTIONS  05-26-2011   CYSTOSCOPY W/ URETERAL STENT PLACEMENT Left 05/13/2013   Procedure: CYSTOSCOPY WITH RETROGRADE PYELOGRAM/URETERAL STENT PLACEMENT;  Surgeon: Bernestine Amass, MD;  Location[facility];  Service: Urology;  Laterality: Left;   CYSTOSCOPY WITH RETROGRADE PYELOGRAM, URETEROSCOPY AND STENT PLACEMENT Left 06/15/2013   Procedure: STENT REMOVAL/CYSTOSCOPY//URETEROSCOPYSTONE EXTRACTION WITH BASKET;  Surgeon: Bernestine Amass, MD;  Location: [facility];  Service: Urology;  Laterality: Left;   HOLMIUM LASER APPLICATION Left 24/26/8341   Procedure: HOLMIUM LASER APPLICATION;  Surgeon: Bernestine Amass, MD;  Location: [facility];  Service: Urology;  Laterality: Left;   INGUINAL HERNIA REPAIR Bilateral 08/24/2015   Procedure: LAPAROSCOPIC BILATERAL INGUINAL HERNIA REPAIR ;  Surgeon: Jackolyn Confer, MD;  Location: [facility];  Service: General;  Laterality: Bilateral;   INSERTION OF MESH Bilateral 08/24/2015   Procedure: WITH INSERTION OF MESH;  Surgeon: Jackolyn Confer, MD;  Location: [facility];  Service: General;  Laterality: Bilateral;   KNEE ARTHROSCOPY Right 2008   SHOULDER ARTHROSCOPY Right 2007   TOTAL KNEE ARTHROPLASTY Left 04/18/2013   Procedure: LEFT TOTAL KNEE ARTHROPLASTY;  Surgeon: Gearlean Alf, MD;  Location: [facility];  Service: Orthopedics;  Laterality: Left;   TRANSTHORACIC ECHOCARDIOGRAM  08-07-2008   NORMAL LVF/  EF 55-60%   TYMPANOPLASTY Right 1971   mild hearing loss    Social History   Socioeconomic History   Marital status: Married  Spouse name: Ennis Forts   Number of children: 1   Years of education: 75   Highest education level: Some college, no degree  Occupational History   Occupation: Quarry manager: [employer]    Comment: Retired   Occupation: Freight forwarder    Comment: for 13 years  Tobacco Use   Smoking status: Never   Smokeless tobacco: Never  Vaping Use   Vaping Use: Never used  Substance and Sexual  Activity   Alcohol use: No   Drug use: No   Sexual activity: Not Currently  Other Topics Concern   Not on file  Social History Narrative   One level living with his wife. They have a basement accessible from outdoors.   Daughter lives in [place].    Social Determinants of Health   Financial Resource Strain: Low Risk  (05/13/2021)   Overall Financial Resource Strain (CARDIA)    Difficulty of Paying Living Expenses: Not hard at all  Food Insecurity: No Food Insecurity (05/13/2021)   Hunger Vital Sign    Worried About Running Out of Food in the Last Year: Never true    Ran Out of Food in the Last Year: Never true  Transportation Needs: No Transportation Needs (05/13/2021)   PRAPARE - Hydrologist (Medical): No    Lack of Transportation (Non-Medical): No  Physical Activity: Insufficiently Active (05/13/2021)   Exercise Vital Sign    Days of Exercise per Week: 7 days    Minutes of Exercise per Session: 10 min  Stress: No Stress Concern Present (05/13/2021)   [place]    Feeling of Stress : Only a little  Social Connections: Moderately Integrated (05/13/2021)   Social Connection and Isolation Panel [NHANES]    Frequency of Communication with Friends and Family: More than three times a week    Frequency of Social Gatherings with Friends and Family: Three times a week    Attends Religious Services: More than 4 times per year    Active Member of Clubs or Organizations: No    Attends Archivist Meetings: Never    Marital Status: Married  Human resources officer Violence: Not At Risk (05/13/2021)   Humiliation, Afraid, Rape, and Kick questionnaire    Fear of Current or Ex-Partner: No    Emotionally Abused: No    Physically Abused: No    Sexually Abused: No    Family History  Problem Relation Age of Onset   Heart failure Mother    Aortic stenosis Mother    Heart disease Mother     Hypertension Father    Dementia Father    Arthritis Sister    Hypertension Sister    Healthy Daughter    Arthritis Daughter     ROS: no fevers or chills, productive cough, hemoptysis, dysphasia, odynophagia, melena, hematochezia, dysuria, hematuria, rash, seizure activity, orthopnea, PND, pedal edema, claudication. Remaining systems are negative.  Physical Exam: Well-developed well-nourished in no acute distress.  Skin is warm and dry.  HEENT is normal.  Neck is supple.  Chest is clear to auscultation with normal expansion.  Cardiovascular exam is regular rate and rhythm.  Abdominal exam nontender or distended. No masses palpated. Extremities show no edema. neuro grossly intact   A/P  1 palpitations-symptoms are reasonably well-controlled.  Will consider addition of beta-blockade in future if necessary.  2 history of chest pain-patient has had no recurrences.  Previous functional study negative.  3 hyperlipidemia-rosuvastatin  was discontinued previously due to muscle aches and fatigue.  I will try Zetia 10 mg daily.  Check lipids and liver in 8 weeks.  Will also check hemoglobin A1c.  4 history of elevated blood pressure-blood pressure is controlled on no medications.  Kirk Ruths, MD

## 2022-07-11 ENCOUNTER — Ambulatory Visit: Payer: Medicare Other | Attending: Cardiology | Admitting: Cardiology

## 2022-07-11 ENCOUNTER — Encounter: Payer: Self-pay | Admitting: Cardiology

## 2022-07-11 VITALS — BP 138/72 | HR 82 | Ht 71.0 in | Wt 209.8 lb

## 2022-07-11 DIAGNOSIS — R7309 Other abnormal glucose: Secondary | ICD-10-CM

## 2022-07-11 DIAGNOSIS — E7800 Pure hypercholesterolemia, unspecified: Secondary | ICD-10-CM | POA: Diagnosis not present

## 2022-07-11 DIAGNOSIS — R002 Palpitations: Secondary | ICD-10-CM | POA: Diagnosis not present

## 2022-07-11 DIAGNOSIS — R079 Chest pain, unspecified: Secondary | ICD-10-CM | POA: Insufficient documentation

## 2022-07-11 MED ORDER — EZETIMIBE 10 MG PO TABS: 10.0000 mg | ORAL_TABLET | Freq: Every day | ORAL | 3 refills | Status: DC

## 2022-07-11 NOTE — Patient Instructions (Signed)
Medication Instructions:  START: ZETIA (EZETIMIBE) '10mg'$  ONCE DAILY  *If you need a refill on your cardiac medications before your next appointment, please call your pharmacy*  Lab Work: Please return for FASTING Blood Work in [place]. No appointment needed, lab here at the office is open Monday-Friday from 8AM to 4PM and closed daily for lunch from 12:45-1:45.    If you have labs (blood work) drawn today and your tests are completely normal, you will receive your results only by: [place] (if you have MyChart) OR A paper copy in the mail If you have any lab test that is abnormal or we need to change your treatment, we will call you to review the results.  Testing/Procedures: None Ordered At This Time.   Follow-Up: At [facility], you and your health needs are our priority.  As part of our continuing mission to provide you with exceptional heart care, we have created designated Provider Care Teams.  These Care Teams include your primary Cardiologist (physician) and Advanced Practice Providers (APPs -  Physician Assistants and Nurse Practitioners) who all work together to provide you with the care you need, when you need it.  Your next appointment:   1 year(s)  The format for your next appointment:   In Person  Provider:   Kirk Ruths, MD

## 2022-09-11 ENCOUNTER — Other Ambulatory Visit: Payer: Medicare Other

## 2022-09-11 DIAGNOSIS — R7309 Other abnormal glucose: Secondary | ICD-10-CM | POA: Diagnosis not present

## 2022-09-11 DIAGNOSIS — E7800 Pure hypercholesterolemia, unspecified: Secondary | ICD-10-CM | POA: Diagnosis not present

## 2022-09-11 DIAGNOSIS — R002 Palpitations: Secondary | ICD-10-CM | POA: Diagnosis not present

## 2022-09-11 DIAGNOSIS — R079 Chest pain, unspecified: Secondary | ICD-10-CM | POA: Diagnosis not present

## 2022-09-12 LAB — LIPID PANEL
Chol/HDL Ratio: 3.3 ratio (ref 0.0–5.0)
Cholesterol, Total: 157 mg/dL (ref 100–199)
HDL: 48 mg/dL (ref >39)
LDL Chol Calc (NIH): 91 mg/dL (ref 0–99)
Triglycerides: 96 mg/dL (ref 0–149)
VLDL Cholesterol Cal: 18 mg/dL (ref 5–40)

## 2022-09-12 LAB — HEMOGLOBIN A1C
Est. average glucose Bld gHb Est-mCnc: 108 mg/dL
Hgb A1c MFr Bld: 5.4 % (ref 4.8–5.6)

## 2022-09-12 LAB — HEPATIC FUNCTION PANEL
ALT: 8 IU/L (ref 0–44)
AST: 22 IU/L (ref 0–40)
Albumin: 4 g/dL (ref 3.8–4.8)
Alkaline Phosphatase: 98 IU/L (ref 44–121)
Bilirubin Total: 1.6 mg/dL — ABNORMAL HIGH (ref 0.0–1.2)
Bilirubin, Direct: 0.35 mg/dL (ref 0.00–0.40)
Total Protein: 6.4 g/dL (ref 6.0–8.5)

## 2022-09-15 ENCOUNTER — Encounter: Payer: Self-pay | Admitting: *Deleted

## 2022-10-07 ENCOUNTER — Encounter: Payer: Self-pay | Admitting: Nurse Practitioner

## 2022-10-07 ENCOUNTER — Ambulatory Visit (INDEPENDENT_AMBULATORY_CARE_PROVIDER_SITE_OTHER): Payer: Medicare Other | Admitting: Nurse Practitioner

## 2022-10-07 VITALS — BP 129/75 | HR 68 | Temp 98.0°F | Resp 20 | Ht 71.0 in | Wt 207.0 lb

## 2022-10-07 DIAGNOSIS — K644 Residual hemorrhoidal skin tags: Secondary | ICD-10-CM | POA: Diagnosis not present

## 2022-10-07 MED ORDER — HYDROCORTISONE (PERIANAL) 2.5 % EX CREA: 1.0000 | TOPICAL_CREAM | Freq: Two times a day (BID) | CUTANEOUS | 0 refills | Status: AC

## 2022-10-07 NOTE — Patient Instructions (Signed)
Hemorrhoids Hemorrhoids are swollen veins that may form: In the butt (rectum). These are called internal hemorrhoids. Around the opening of the butt (anus). These are called external hemorrhoids. Most hemorrhoids do not cause very bad problems. They often get better with changes to your lifestyle and what you eat. What are the causes? Having trouble pooping (constipation) or watery poop (diarrhea). Pushing too hard when you poop. Pregnancy. Being very overweight (obese). Sitting for too long. Riding a bike for a long time. Heavy lifting or other things that take a lot of effort. Anal sex. What are the signs or symptoms? Pain. Itching or soreness in the butt. Bleeding from the butt. Leaking poop. Swelling. One or more lumps around the opening of your butt. How is this treated? In most cases, hemorrhoids can be treated at home. You may be told to: Change what you eat. Make changes to your lifestyle. If these treatments do not help, you may need to have a procedure done. Your doctor may need to: Place rubber bands at the bottom of the hemorrhoids to make them fall off. Put medicine into the hemorrhoids to shrink them. Shine a type of light on the hemorrhoids to cause them to fall off. Do surgery to get rid of the hemorrhoids. Follow these instructions at home: Medicines Take over-the-counter and prescription medicines only as told by your doctor. Use creams with medicine in them or medicines that you put in your butt as told by your doctor. Eating and drinking  Eat foods that have a lot of fiber in them. These include whole grains, beans, nuts, fruits, and vegetables. Ask your doctor about taking products that have fiber added to them (fibersupplements). Take in less fat. You can do this by: Eating low-fat dairy products. Eating less red meat. Staying away from processed foods. Drink enough fluid to keep your pee (urine) pale yellow. Managing pain and swelling  Take a  warm-water bath (sitz bath) for 20 minutes to ease pain. Do this 3-4 times a day. You may do this in a bathtub. You may also use a portable sitz bath that fits over the toilet. If told, put ice on the painful area. It may help to use ice between your warm baths. Put ice in a plastic bag. Place a towel between your skin and the bag. Leave the ice on for 20 minutes, 2-3 times a day. If your skin turns bright red, take off the ice right away to prevent skin damage. The risk of damage is higher if you cannot feel pain, heat, or cold. General instructions Exercise. Ask your doctor how much and what kind of exercise is best for you. Go to the bathroom when you need to poop. Do not wait. Try not to push too hard when you poop. Keep your butt dry and clean. Use wet toilet paper or moist towelettes after you poop. Do not sit on the toilet for a long time. Contact a doctor if: You have pain and swelling that do not get better with treatment. You have trouble pooping. You cannot poop. You have pain or swelling outside the area of the hemorrhoids. Get help right away if: You have bleeding from the butt that will not stop. This information is not intended to replace advice given to you by your health care provider. Make sure you discuss any questions you have with your health care provider. Document Revised: 04/02/2022 Document Reviewed: 04/02/2022 Elsevier Patient Education  [place].

## 2022-10-07 NOTE — Progress Notes (Signed)
   Subjective:    Patient ID: Jake Barnett, male    DOB: [birth year], 82 y.o.   MRN: [id number]   Chief Complaint: Hemorrhoids   HPI Patient come sin  today c/o hemorrhoids. He has had internal ones for years put now has one on the outside. Has been draining some when he has a bowel movement. No pain with BM.  Patient Active Problem List   Diagnosis Date Noted   History of renal calculi 01/24/2020   Cholelithiasis with chronic cholecystitis 12/04/2015   Ureteral calculus 05/13/2013   OA (osteoarthritis) of knee [id number]   Diastolic dysfunction [id number]   Hyperlipidemia 12/16/2010   GERD (gastroesophageal reflux disease)    Palpitations        Review of Systems  Constitutional:  Negative for diaphoresis.  Eyes:  Negative for pain.  Respiratory:  Negative for shortness of breath.   Cardiovascular:  Negative for chest pain, palpitations and leg swelling.  Gastrointestinal:  Negative for abdominal pain.  Endocrine: Negative for polydipsia.  Skin:  Negative for rash.  Neurological:  Negative for dizziness, weakness and headaches.  Hematological:  Does not bruise/bleed easily.  All other systems reviewed and are negative.      Objective:   Physical Exam Constitutional:      Appearance: Normal appearance.  Cardiovascular:     Rate and Rhythm: Normal rate and regular rhythm.     Heart sounds: Normal heart sounds.  Pulmonary:     Breath sounds: Normal breath sounds.  Skin:    General: Skin is warm.  Neurological:     General: No focal deficit present.     Mental Status: He is alert and oriented to person, place, and time.  Psychiatric:        Mood and Affect: Mood normal.        Behavior: Behavior normal.    BP 129/75   Pulse 68   Temp 98 F (36.7 C) (Temporal)   Resp 20   Ht '5\' 11"'$  (1.803 m)   Wt 207 lb (93.9 kg)   SpO2 97%   BMI 28.87 kg/m         Assessment & Plan:   Jake Barnett in today with chief complaint of Hemorrhoids   1. External  hemorrhoid Stool softners if gets constipated If bleeding does not stop and hemorrhoid shrink let us know.    The above assessment and management plan was discussed with the patient. The patient verbalized understanding of and has agreed to the management plan. Patient is aware to call the clinic if symptoms persist or worsen. Patient is aware when to return to the clinic for a follow-up visit. Patient educated on when it is appropriate to go to the emergency department.   Mary-Margaret Hassell Done, FNP

## 2022-11-13 DIAGNOSIS — J0100 Acute maxillary sinusitis, unspecified: Secondary | ICD-10-CM | POA: Diagnosis not present

## 2022-12-04 DIAGNOSIS — N200 Calculus of kidney: Secondary | ICD-10-CM | POA: Diagnosis not present

## 2022-12-06 ENCOUNTER — Other Ambulatory Visit: Payer: Self-pay | Admitting: Family Medicine

## 2022-12-06 DIAGNOSIS — K219 Gastro-esophageal reflux disease without esophagitis: Secondary | ICD-10-CM

## 2022-12-09 ENCOUNTER — Other Ambulatory Visit: Payer: Self-pay | Admitting: Urology

## 2022-12-19 DIAGNOSIS — R051 Acute cough: Secondary | ICD-10-CM | POA: Diagnosis not present

## 2022-12-19 DIAGNOSIS — J0100 Acute maxillary sinusitis, unspecified: Secondary | ICD-10-CM | POA: Diagnosis not present

## 2022-12-25 ENCOUNTER — Telehealth: Payer: Self-pay | Admitting: Family Medicine

## 2022-12-25 DIAGNOSIS — K219 Gastro-esophageal reflux disease without esophagitis: Secondary | ICD-10-CM

## 2022-12-25 NOTE — Telephone Encounter (Signed)
  Prescription Request  12/25/2022  Is this a "Controlled Substance" medicine? no  Have you seen your PCP in the last 2 weeks? Made appt for first available 01/26/23  If YES, route message to pool  -  If NO, patient needs to be scheduled for appointment.  What is the name of the medication or equipment? omeprazole (PRILOSEC) 20 MG capsule  Have you contacted your pharmacy to request a refill? Yes, had a 30 day supply but is about to run out. Wants to know if he can get enough called in to last until his appt   Which pharmacy would you like this sent to?  [id number] - [place], [place] - [address]     Patient notified that their request is being sent to the clinical staff for review and that they should receive a response within 2 business days.

## 2022-12-26 MED ORDER — OMEPRAZOLE 20 MG PO CPDR
20.0000 mg | DELAYED_RELEASE_CAPSULE | Freq: Every day | ORAL | 0 refills | Status: DC
Start: 2022-12-26 — End: 2023-01-26

## 2022-12-26 NOTE — Telephone Encounter (Signed)
Sent omeprazole to the pharmacy per patients request. Left detailed message on voicemail that rx had been sent

## 2023-01-03 ENCOUNTER — Other Ambulatory Visit: Payer: Self-pay | Admitting: Family Medicine

## 2023-01-03 DIAGNOSIS — K219 Gastro-esophageal reflux disease without esophagitis: Secondary | ICD-10-CM

## 2023-01-05 DIAGNOSIS — N200 Calculus of kidney: Secondary | ICD-10-CM | POA: Diagnosis not present

## 2023-01-05 DIAGNOSIS — N400 Enlarged prostate without lower urinary tract symptoms: Secondary | ICD-10-CM | POA: Diagnosis not present

## 2023-01-13 DIAGNOSIS — D2371 Other benign neoplasm of skin of right lower limb, including hip: Secondary | ICD-10-CM | POA: Diagnosis not present

## 2023-01-13 DIAGNOSIS — M79671 Pain in right foot: Secondary | ICD-10-CM | POA: Diagnosis not present

## 2023-01-15 NOTE — Progress Notes (Signed)
Pre-op phone call attempted. Left voicemail for patient to return phone call.  

## 2023-01-16 ENCOUNTER — Encounter (HOSPITAL_BASED_OUTPATIENT_CLINIC_OR_DEPARTMENT_OTHER): Payer: Self-pay | Admitting: Urology

## 2023-01-16 NOTE — Progress Notes (Signed)
Called and left voi for [name]. scheduler for [facility]. Patient consent order has conflicting information regarding physician performing procedure. Asked for clarification on consent.

## 2023-01-16 NOTE — Progress Notes (Signed)
Patient returned message regarding lithotripsy on 01/19/2023.  Instructed to:   Stop vitamin D  No NSAIDS or pepto bismol for 48 hours.  No aspirin products for 72 hours.  Wear comfortable clothing without metal attachments.  Wear comfortable shoes, no flip flops, sandals, or crocs.  Take laxative of choice day before procedure.  No solid food after midnight, no liquids after 0430.  No alcohol 24 hours prior to procedure. Must have driver day of procedure and can not be left home alone for 24 hours after procedure.   Reviewed arrival time of 1030, given directions to facility, provided phone number for questions.  Reviewed allergies, medications, and medical/surgical history.

## 2023-01-19 ENCOUNTER — Ambulatory Visit (HOSPITAL_BASED_OUTPATIENT_CLINIC_OR_DEPARTMENT_OTHER)
Admission: RE | Admit: 2023-01-19 | Discharge: 2023-01-19 | Disposition: A | Discharge status: Home / Self Care | Payer: Medicare Other | Attending: Urology | Admitting: Urology

## 2023-01-19 ENCOUNTER — Ambulatory Visit (HOSPITAL_COMMUNITY): Payer: Medicare Other

## 2023-01-19 ENCOUNTER — Encounter (HOSPITAL_BASED_OUTPATIENT_CLINIC_OR_DEPARTMENT_OTHER): Payer: Self-pay | Admitting: Urology

## 2023-01-19 ENCOUNTER — Other Ambulatory Visit: Payer: Self-pay

## 2023-01-19 ENCOUNTER — Encounter (HOSPITAL_BASED_OUTPATIENT_CLINIC_OR_DEPARTMENT_OTHER)
Admission: RE | Disposition: A | Discharge status: Home / Self Care | Payer: Self-pay | Source: Home / Self Care | Attending: Urology

## 2023-01-19 DIAGNOSIS — E785 Hyperlipidemia, unspecified: Secondary | ICD-10-CM | POA: Insufficient documentation

## 2023-01-19 DIAGNOSIS — Z87442 Personal history of urinary calculi: Secondary | ICD-10-CM | POA: Insufficient documentation

## 2023-01-19 DIAGNOSIS — I499 Cardiac arrhythmia, unspecified: Secondary | ICD-10-CM | POA: Insufficient documentation

## 2023-01-19 DIAGNOSIS — N200 Calculus of kidney: Secondary | ICD-10-CM | POA: Insufficient documentation

## 2023-01-19 HISTORY — PX: EXTRACORPOREAL SHOCK WAVE LITHOTRIPSY: SHX1557

## 2023-01-19 SURGERY — LITHOTRIPSY, ESWL
Anesthesia: LOCAL | Laterality: Right

## 2023-01-19 MED ORDER — OXYCODONE HCL 5 MG PO TABS: 5.0000 mg | ORAL_TABLET | Freq: Four times a day (QID) | ORAL | 0 refills | Status: DC | PRN

## 2023-01-19 MED ORDER — CIPROFLOXACIN HCL 500 MG PO TABS
ORAL_TABLET | ORAL | Status: AC
  Filled 2023-01-19: qty 1

## 2023-01-19 MED ORDER — DIAZEPAM 5 MG PO TABS
10.0000 mg | ORAL_TABLET | ORAL | Status: AC
  Administered 2023-01-19: 10 mg via ORAL

## 2023-01-19 MED ORDER — DIPHENHYDRAMINE HCL 25 MG PO CAPS
25.0000 mg | ORAL_CAPSULE | ORAL | Status: AC
  Administered 2023-01-19: 25 mg via ORAL

## 2023-01-19 MED ORDER — DIAZEPAM 5 MG PO TABS
ORAL_TABLET | ORAL | Status: AC
  Filled 2023-01-19: qty 2

## 2023-01-19 MED ORDER — DIPHENHYDRAMINE HCL 25 MG PO CAPS
ORAL_CAPSULE | ORAL | Status: AC
  Filled 2023-01-19: qty 1

## 2023-01-19 MED ORDER — CIPROFLOXACIN HCL 500 MG PO TABS
500.0000 mg | ORAL_TABLET | ORAL | Status: AC
  Administered 2023-01-19: 500 mg via ORAL

## 2023-01-19 MED ORDER — TAMSULOSIN HCL 0.4 MG PO CAPS: 0.4000 mg | ORAL_CAPSULE | Freq: Every day | ORAL | 0 refills | Status: DC

## 2023-01-19 MED ORDER — SODIUM CHLORIDE 0.9 % IV SOLN: INTRAVENOUS | Status: DC

## 2023-01-19 NOTE — H&P (Signed)
H&P  History of Present Illness: Jake Barnett is a 82 y.o. year old M who presents today for R ESWL for a right renal stone.   Past Medical History:  Diagnosis Date   Arthritis    Cancer ([facility])    hx of basal cell skin cancer    Cholelithiasis with chronic cholecystitis 12/04/2015   Chronic kidney disease    kidney stones   Difficult intubation 08/24/2015   GERD (gastroesophageal reflux disease)    History of kidney stones    Hyperlipidemia    Left ureteral calculus    Mild diastolic dysfunction    AYMPTOMATIC   Palpitations    occasional PAC/PVC.   PONV (postoperative nausea and vomiting)    Postoperative anemia due to acute blood loss 04/19/2013   RBBB     Past Surgical History:  Procedure Laterality Date   ANTERIOR CERVICAL DECOMP/DISCECTOMY FUSION  2001   C5  -- C7- some limited hyperflexion of neck   CARDIOVASCULAR STRESS TEST  02-11-2011   DR CRENSHAW   NORMAL / NO ISCHEMIA/ EF 66%   CHOLECYSTECTOMY N/A 12/04/2015   Procedure: LAPAROSCOPIC CHOLECYSTECTOMY;  Surgeon: Claud Kelp, MD;  Location: [facility];  Service: General;  Laterality: N/A;   CYSTO/ BILATERAL LASER LITHOTRIPSY WITH STONE EXTRACTIONS  05-26-2011   CYSTOSCOPY W/ URETERAL STENT PLACEMENT Left 05/13/2013   Procedure: CYSTOSCOPY WITH RETROGRADE PYELOGRAM/URETERAL STENT PLACEMENT;  Surgeon: Valetta Fuller, MD;  [facility];  Service: Urology;  Laterality: Left;   CYSTOSCOPY WITH RETROGRADE PYELOGRAM, URETEROSCOPY AND STENT PLACEMENT Left 06/15/2013   Procedure: STENT REMOVAL/CYSTOSCOPY//URETEROSCOPYSTONE EXTRACTION WITH BASKET;  Surgeon: Valetta Fuller, MD;  [facility];  Service: Urology;  Laterality: Left;   HOLMIUM LASER APPLICATION Left 06/15/2013   Procedure: HOLMIUM LASER APPLICATION;  Surgeon: Valetta Fuller, MD;  Location[facility];  Service: Urology;  Laterality: Left;   INGUINAL HERNIA REPAIR Bilateral 08/24/2015   Procedure: LAPAROSCOPIC BILATERAL INGUINAL HERNIA REPAIR ;   Surgeon: Avel Peace, MD;  Location: [facility];  Service: General;  Laterality: Bilateral;   INSERTION OF MESH Bilateral 08/24/2015   Procedure: WITH INSERTION OF MESH;  Surgeon: Avel Peace, MD;  Location[facility];  Service: General;  Laterality: Bilateral;   KNEE ARTHROSCOPY Right 2008   SHOULDER ARTHROSCOPY Right 2007   TOTAL KNEE ARTHROPLASTY Left 04/18/2013   Procedure: LEFT TOTAL KNEE ARTHROPLASTY;  Surgeon: Loanne Drilling, MD;  Location: [facility];  Service: Orthopedics;  Laterality: Left;   TRANSTHORACIC ECHOCARDIOGRAM  08-07-2008   NORMAL LVF/  EF 55-60%   TYMPANOPLASTY Right 1971   mild hearing loss    Home Medications:  Current Meds  Medication Sig   Cholecalciferol (DIALYVITE VITAMIN D 5000) 125 MCG (5000 UT) capsule Take 5,000 Units by mouth daily.   ezetimibe (ZETIA) 10 MG tablet Take 1 tablet (10 mg total) by mouth daily.   omeprazole (PRILOSEC) 20 MG capsule Take 1 capsule (20 mg total) by mouth daily. (NEEDS TO BE SEEN BEFORE NEXT REFILL)    Allergies:  Allergies  Allergen Reactions   Indomethacin Nausea Only and Other (See Comments)    Stomach ache   Indomethacin    Statins Other (See Comments)    Muscle pain, fatigue and dizziness    Family History  Problem Relation Age of Onset   Heart failure Mother    Aortic stenosis Mother    Heart disease Mother    Hypertension Father    Dementia Father    Arthritis  Sister    Hypertension Sister    Healthy Daughter    Arthritis Daughter     Social History:  reports that he has never smoked. He has never used smokeless tobacco. He reports that he does not drink alcohol and does not use drugs.  ROS: A complete review of systems was performed.  All systems are negative except for pertinent findings as noted.  Physical Exam:  Vital signs in last 24 hours:   Constitutional:  Alert and oriented, No acute distress Cardiovascular: Regular rate and rhythm Respiratory: Normal respiratory effort, Lungs clear  bilaterally GI: Abdomen is soft, nontender, nondistended, no abdominal masses Lymphatic: No lymphadenopathy Neurologic: Grossly intact, no focal deficits Psychiatric: Normal mood and affect   Laboratory Data:  No results for input(s): "WBC", "HGB", "HCT", "PLT" in the last 72 hours.  No results for input(s): "NA", "K", "CL", "GLUCOSE", "BUN", "CALCIUM", "CREATININE" in the last 72 hours.  Invalid input(s): "CO3"   No results found for this or any previous visit (from the past 24 hour(s)). No results found for this or any previous visit (from the past 240 hour(s)).  Renal Function: No results for input(s): "CREATININE" in the last 168 hours. CrCl cannot be calculated (Patient's most recent lab result is older than the maximum 21 days allowed.).  Radiologic Imaging: No results found.  Assessment:  Jake Barnett is a 82 y.o. year old M with right renal stone  Plan:  R ESWL as planned. Procedure and risks reviewed (including but not limited to hematuria, hematoma, infection, sepsis, retained stone fragments, need for additional procedures)  Irine Seal, MD 01/19/2023, 10:56 AM  [facility] Pager: [phone]

## 2023-01-19 NOTE — Op Note (Signed)
See [employer] operative note scanned into chart. Also because of the size, density, location and other factors that cannot be anticipated I feel this will likely be a staged procedure. This fact supersedes any indication in the scanned [place] stone operative note to the contrary.  Irine Seal MD 01/19/2023, 1:34 PM  [facility]  Pager: [phone]

## 2023-01-19 NOTE — Discharge Instructions (Signed)

## 2023-01-20 ENCOUNTER — Encounter (HOSPITAL_BASED_OUTPATIENT_CLINIC_OR_DEPARTMENT_OTHER): Payer: Self-pay | Admitting: Urology

## 2023-01-26 ENCOUNTER — Encounter: Payer: Self-pay | Admitting: Family Medicine

## 2023-01-26 ENCOUNTER — Ambulatory Visit (INDEPENDENT_AMBULATORY_CARE_PROVIDER_SITE_OTHER): Payer: Medicare Other

## 2023-01-26 ENCOUNTER — Ambulatory Visit (INDEPENDENT_AMBULATORY_CARE_PROVIDER_SITE_OTHER): Payer: Medicare Other | Admitting: Family Medicine

## 2023-01-26 VITALS — BP 130/78 | HR 78 | Temp 97.8°F | Resp 20 | Ht 71.0 in | Wt 210.0 lb

## 2023-01-26 DIAGNOSIS — R17 Unspecified jaundice: Secondary | ICD-10-CM

## 2023-01-26 DIAGNOSIS — Z125 Encounter for screening for malignant neoplasm of prostate: Secondary | ICD-10-CM | POA: Diagnosis not present

## 2023-01-26 DIAGNOSIS — J302 Other seasonal allergic rhinitis: Secondary | ICD-10-CM | POA: Diagnosis not present

## 2023-01-26 DIAGNOSIS — K219 Gastro-esophageal reflux disease without esophagitis: Secondary | ICD-10-CM | POA: Diagnosis not present

## 2023-01-26 DIAGNOSIS — R350 Frequency of micturition: Secondary | ICD-10-CM

## 2023-01-26 DIAGNOSIS — R059 Cough, unspecified: Secondary | ICD-10-CM

## 2023-01-26 DIAGNOSIS — E7800 Pure hypercholesterolemia, unspecified: Secondary | ICD-10-CM | POA: Diagnosis not present

## 2023-01-26 MED ORDER — DESLORATADINE 5 MG PO TABS
5.0000 mg | ORAL_TABLET | Freq: Every day | ORAL | 3 refills | Status: DC
Start: 2023-01-26 — End: 2023-10-12

## 2023-01-26 MED ORDER — OMEPRAZOLE 20 MG PO CPDR: 20.0000 mg | DELAYED_RELEASE_CAPSULE | Freq: Every day | ORAL | 3 refills | Status: DC | PRN

## 2023-01-26 MED ORDER — AZELASTINE HCL 0.1 % NA SOLN
1.0000 | Freq: Two times a day (BID) | NASAL | 12 refills | Status: DC | PRN
Start: 2023-01-26 — End: 2024-03-22

## 2023-01-26 NOTE — Patient Instructions (Signed)
Dr Jens Som got your cholesterol already in February so didn't need to repeat that just yet. Remainder of labs will be back tomorrow.

## 2023-01-26 NOTE — Progress Notes (Signed)
Subjective: CC: Checkup PCP: Raliegh Ip, DO ZOX:WRUEA Jake Barnett is a 82 y.o. male presenting to clinic today for:  1.  Hyperlipidemia Patient recently had Zetia added.  He had repeat fasting lipid back in February of this year and it showed improvement in LDL.  He does not report any chest pain, shortness of breath.  He is not having any myalgia like he did with the statin therapy.  2.  Allergic rhinitis Patient reports that he has been having ongoing cough.  This has been present for a couple of months now.  It was treated x 2 with oral antibiotics and steroid.  The cough is overall getting better but not resolved and this causes concern.  No hemoptysis, unplanned weight loss or night sweats reported.  He does report some postnasal drainage and is not currently taking any oral or topical antihistamines.   ROS: Per HPI  Allergies  Allergen Reactions   Indomethacin Nausea Only and Other (See Comments)    Stomach ache   Indomethacin    Statins Other (See Comments)    Muscle pain, fatigue and dizziness   Past Medical History:  Diagnosis Date   Arthritis    Cancer (HCC)    hx of basal cell skin cancer    Cholelithiasis with chronic cholecystitis 12/04/2015   Chronic kidney disease    kidney stones   Difficult intubation 08/24/2015   GERD (gastroesophageal reflux disease)    History of kidney stones    Hyperlipidemia    Left ureteral calculus    Mild diastolic dysfunction    AYMPTOMATIC   Palpitations    occasional PAC/PVC.   PONV (postoperative nausea and vomiting)    Postoperative anemia due to acute blood loss 04/19/2013   RBBB     Current Outpatient Medications:    Cholecalciferol (DIALYVITE VITAMIN D 5000) 125 MCG (5000 UT) capsule, Take 5,000 Units by mouth daily., Disp: , Rfl:    ezetimibe (ZETIA) 10 MG tablet, Take 1 tablet (10 mg total) by mouth daily., Disp: 90 tablet, Rfl: 3   hydrocortisone (ANUSOL-HC) 2.5 % rectal cream, Place 1 Application rectally 2  (two) times daily., Disp: 30 g, Rfl: 0   omeprazole (PRILOSEC) 20 MG capsule, Take 1 capsule (20 mg total) by mouth daily. (NEEDS TO BE SEEN BEFORE NEXT REFILL), Disp: 30 capsule, Rfl: 0   tamsulosin (FLOMAX) 0.4 MG CAPS capsule, Take 1 capsule (0.4 mg total) by mouth daily after supper., Disp: 30 capsule, Rfl: 0   oxyCODONE (ROXICODONE) 5 MG immediate release tablet, Take 1 tablet (5 mg total) by mouth every 6 (six) hours as needed for severe pain. (Patient not taking: Reported on 01/26/2023), Disp: 12 tablet, Rfl: 0 Social History   Socioeconomic History   Marital status: Married    Spouse name: Jake Barnett   Number of children: 1   Years of education: 13   Highest education level: Some college, no degree  Occupational History   Occupation: Landscape architect: [employer]    Comment: Retired   Occupation: Advertising account planner    Comment: for 13 years  Tobacco Use   Smoking status: Never   Smokeless tobacco: Never  Vaping Use   Vaping Use: Never used  Substance and Sexual Activity   Alcohol use: No   Drug use: No   Sexual activity: Not Currently  Other Topics Concern   Not on file  Social History Narrative   One level living with his wife. They have a basement  accessible from outdoors.   Daughter lives in [place].    Social Determinants of Health   Financial Resource Strain: Low Risk  (05/13/2021)   Overall Financial Resource Strain (CARDIA)    Difficulty of Paying Living Expenses: Not hard at all  Food Insecurity: No Food Insecurity (05/13/2021)   Hunger Vital Sign    Worried About Running Out of Food in the Last Year: Never true    Ran Out of Food in the Last Year: Never true  Transportation Needs: No Transportation Needs (05/13/2021)   PRAPARE - Administrator, Civil Service (Medical): No    Lack of Transportation (Non-Medical): No  Physical Activity: Insufficiently Active (05/13/2021)   Exercise Vital Sign    Days of Exercise per Week: 7 days    Minutes of Exercise per  Session: 10 min  Stress: No Stress Concern Present (05/13/2021)   [employer] of Occupational Health - Occupational Stress Questionnaire    Feeling of Stress : Only a little  Social Connections: Moderately Integrated (05/13/2021)   Social Connection and Isolation Panel [NHANES]    Frequency of Communication with Friends and Family: More than three times a week    Frequency of Social Gatherings with Friends and Family: Three times a week    Attends Religious Services: More than 4 times per year    Active Member of Clubs or Organizations: No    Attends Banker Meetings: Never    Marital Status: Married  Catering manager Violence: Not At Risk (05/13/2021)   Humiliation, Afraid, Rape, and Kick questionnaire    Fear of Current or Ex-Partner: No    Emotionally Abused: No    Physically Abused: No    Sexually Abused: No   Family History  Problem Relation Age of Onset   Heart failure Mother    Aortic stenosis Mother    Heart disease Mother    Hypertension Father    Dementia Father    Arthritis Sister    Hypertension Sister    Healthy Daughter    Arthritis Daughter     Objective: Office vital signs reviewed. BP 130/78   Pulse 78   Temp 97.8 F (36.6 C) (Oral)   Resp 20   Ht 5\' 11"  (1.803 m)   Wt 210 lb (95.3 kg)   SpO2 97%   BMI 29.29 kg/m   Physical Examination:  General: Awake, alert, well nourished, No acute distress HEENT: Sclera white.  Moist mucous membranes. Cardio: regular rate and rhythm, S1S2 heard, no murmurs appreciated Pulm: clear to auscultation bilaterally, no wheezes, rhonchi or rales; normal work of breathing on room air MSK: Ambulating independently with normal gait and station  Assessment/ Plan: 82 y.o. male   Cough in adult - Plan: DG Chest 2 View  Seasonal allergic rhinitis, unspecified trigger - Plan: azelastine (ASTELIN) 0.1 % nasal spray, desloratadine (CLARINEX) 5 MG tablet  Pure hypercholesterolemia  Elevated bilirubin -  Plan: CMP14+EGFR, CBC  Gastroesophageal reflux disease without esophagitis - Plan: omeprazole (PRILOSEC) 20 MG capsule  Screening for malignant neoplasm of prostate - Plan: PSA  Urinary frequency - Plan: PSA  Given persistent nature of the cough I did obtain plain films.  Upon personal review, there is no evidence of pulmonary infiltrates, heart enlargement etc.  I suspect that the cough is likely triggered by uncontrolled seasonal allergic rhinitis and I have sent over Astelin and Clarinex to treat.  I reviewed his labs and recommendations by cardiology.  Incidentally, he had a mildly  elevated bilirubin recently so I will check this again for him.  Sounds like he is doing well on the Zetia  Continue PPI for GERD that we did not discuss this during today's visit  Check PSA.  He is under the care of urology for renal stone but does not think that he has had PSA checked recently  No orders of the defined types were placed in this encounter.  No orders of the defined types were placed in this encounter.    Raliegh Ip, DO [facility] [phone]

## 2023-01-27 LAB — CBC
Hematocrit: 45.2 % (ref 37.5–51.0)
Hemoglobin: 15.2 g/dL (ref 13.0–17.7)
MCH: 29.1 pg (ref 26.6–33.0)
MCHC: 33.6 g/dL (ref 31.5–35.7)
MCV: 87 fL (ref 79–97)
Platelets: 267 10*3/uL (ref 150–450)
RBC: 5.22 x10E6/uL (ref 4.14–5.80)
RDW: 13.3 % (ref 11.6–15.4)
WBC: 7.5 10*3/uL (ref 3.4–10.8)

## 2023-01-27 LAB — CMP14+EGFR
ALT: 9 IU/L (ref 0–44)
AST: 20 IU/L (ref 0–40)
Albumin: 4.1 g/dL (ref 3.8–4.8)
Alkaline Phosphatase: 99 IU/L (ref 44–121)
BUN/Creatinine Ratio: 13 (ref 10–24)
BUN: 13 mg/dL (ref 8–27)
Bilirubin Total: 0.8 mg/dL (ref 0.0–1.2)
CO2: 25 mmol/L (ref 20–29)
Calcium: 9.3 mg/dL (ref 8.6–10.2)
Chloride: 104 mmol/L (ref 96–106)
Creatinine, Ser: 0.97 mg/dL (ref 0.76–1.27)
Globulin, Total: 2.2 g/dL (ref 1.5–4.5)
Glucose: 91 mg/dL (ref 70–99)
Potassium: 4.5 mmol/L (ref 3.5–5.2)
Sodium: 141 mmol/L (ref 134–144)
Total Protein: 6.3 g/dL (ref 6.0–8.5)
eGFR: 79 mL/min/{1.73_m2} (ref >59)

## 2023-01-27 LAB — PSA: Prostate Specific Ag, Serum: 2.6 ng/mL (ref 0.0–4.0)

## 2023-02-02 DIAGNOSIS — N200 Calculus of kidney: Secondary | ICD-10-CM | POA: Diagnosis not present

## 2023-03-19 DIAGNOSIS — L821 Other seborrheic keratosis: Secondary | ICD-10-CM | POA: Diagnosis not present

## 2023-03-19 DIAGNOSIS — L820 Inflamed seborrheic keratosis: Secondary | ICD-10-CM | POA: Diagnosis not present

## 2023-03-19 DIAGNOSIS — L570 Actinic keratosis: Secondary | ICD-10-CM | POA: Diagnosis not present

## 2023-03-19 DIAGNOSIS — L578 Other skin changes due to chronic exposure to nonionizing radiation: Secondary | ICD-10-CM | POA: Diagnosis not present

## 2023-04-27 DIAGNOSIS — Z23 Encounter for immunization: Secondary | ICD-10-CM | POA: Diagnosis not present

## 2023-05-11 DIAGNOSIS — N200 Calculus of kidney: Secondary | ICD-10-CM | POA: Diagnosis not present

## 2023-05-11 DIAGNOSIS — R3912 Poor urinary stream: Secondary | ICD-10-CM | POA: Diagnosis not present

## 2023-05-11 DIAGNOSIS — N401 Enlarged prostate with lower urinary tract symptoms: Secondary | ICD-10-CM | POA: Diagnosis not present

## 2023-06-01 DIAGNOSIS — H2513 Age-related nuclear cataract, bilateral: Secondary | ICD-10-CM | POA: Diagnosis not present

## 2023-06-01 DIAGNOSIS — H1045 Other chronic allergic conjunctivitis: Secondary | ICD-10-CM | POA: Diagnosis not present

## 2023-06-01 DIAGNOSIS — H02831 Dermatochalasis of right upper eyelid: Secondary | ICD-10-CM | POA: Diagnosis not present

## 2023-06-01 DIAGNOSIS — H04123 Dry eye syndrome of bilateral lacrimal glands: Secondary | ICD-10-CM | POA: Diagnosis not present

## 2023-06-01 DIAGNOSIS — H0288A Meibomian gland dysfunction right eye, upper and lower eyelids: Secondary | ICD-10-CM | POA: Diagnosis not present

## 2023-06-01 DIAGNOSIS — H02834 Dermatochalasis of left upper eyelid: Secondary | ICD-10-CM | POA: Diagnosis not present

## 2023-06-01 DIAGNOSIS — H0288B Meibomian gland dysfunction left eye, upper and lower eyelids: Secondary | ICD-10-CM | POA: Diagnosis not present

## 2023-06-28 ENCOUNTER — Other Ambulatory Visit: Payer: Self-pay | Admitting: Cardiology

## 2023-06-29 NOTE — Progress Notes (Unsigned)
Cardiology Clinic Note   Patient Name: Jake Barnett Date of Encounter: 07/06/2023  Primary Care Provider:  Raliegh Ip, DO Primary Cardiologist:  Olga Millers, MD  Patient Profile     Jake Barnett 82 year old male presents to the clinic today for follow-up evaluation of his palpitations, diastolic dysfunction and hyperlipidemia.  Past Medical History    Past Medical History:  Diagnosis Date   Arthritis    Cancer (HCC)    hx of basal cell skin cancer    Cholelithiasis with chronic cholecystitis 12/04/2015   Chronic kidney disease    kidney stones   Difficult intubation 08/24/2015   GERD (gastroesophageal reflux disease)    History of kidney stones    Hyperlipidemia    Left ureteral calculus    Mild diastolic dysfunction    AYMPTOMATIC   Palpitations    occasional PAC/PVC.   PONV (postoperative nausea and vomiting)    Postoperative anemia due to acute blood loss 04/19/2013   RBBB    Past Surgical History:  Procedure Laterality Date   ANTERIOR CERVICAL DECOMP/DISCECTOMY FUSION  2001   C5  -- C7- some limited hyperflexion of neck   CARDIOVASCULAR STRESS TEST  02-11-2011   DR CRENSHAW   NORMAL / NO ISCHEMIA/ EF 66%   CHOLECYSTECTOMY N/A 12/04/2015   Procedure: LAPAROSCOPIC CHOLECYSTECTOMY;  Surgeon: Claud Kelp, MD;  Location: [facility];  Service: General;  Laterality: N/A;   CYSTO/ BILATERAL LASER LITHOTRIPSY WITH STONE EXTRACTIONS  05-26-2011   CYSTOSCOPY W/ URETERAL STENT PLACEMENT Left 05/13/2013   Procedure: CYSTOSCOPY WITH RETROGRADE PYELOGRAM/URETERAL STENT PLACEMENT;  Surgeon: Valetta Fuller, MD;  Location[facility];  Service: Urology;  Laterality: Left;   CYSTOSCOPY WITH RETROGRADE PYELOGRAM, URETEROSCOPY AND STENT PLACEMENT Left 06/15/2013   Procedure: STENT REMOVAL/CYSTOSCOPY//URETEROSCOPYSTONE EXTRACTION WITH BASKET;  Surgeon: Valetta Fuller, MD;  Location: [facility];  Service: Urology;  Laterality: Left;   EXTRACORPOREAL SHOCK WAVE  LITHOTRIPSY Right 01/19/2023   Procedure: RIGHT EXTRACORPOREAL SHOCK WAVE LITHOTRIPSY (ESWL);  Surgeon: Despina Arias, MD;  Location: [facility];  Service: Urology;  Laterality: Right;  75 MINUTES   HOLMIUM LASER APPLICATION Left 06/15/2013   Procedure: HOLMIUM LASER APPLICATION;  Surgeon: Valetta Fuller, MD;  Location: [facility];  Service: Urology;  Laterality: Left;   INGUINAL HERNIA REPAIR Bilateral 08/24/2015   Procedure: LAPAROSCOPIC BILATERAL INGUINAL HERNIA REPAIR ;  Surgeon: Avel Peace, MD;  Location: [facility];  Service: General;  Laterality: Bilateral;   INSERTION OF MESH Bilateral 08/24/2015   Procedure: WITH INSERTION OF MESH;  Surgeon: Avel Peace, MD;  Location: [facility];  Service: General;  Laterality: Bilateral;   KNEE ARTHROSCOPY Right 2008   SHOULDER ARTHROSCOPY Right 2007   TOTAL KNEE ARTHROPLASTY Left 04/18/2013   Procedure: LEFT TOTAL KNEE ARTHROPLASTY;  Surgeon: Loanne Drilling, MD;  Location: [facility];  Service: Orthopedics;  Laterality: Left;   TRANSTHORACIC ECHOCARDIOGRAM  08-07-2008   NORMAL LVF/  EF 55-60%   TYMPANOPLASTY Right 1971   mild hearing loss    Allergies  Allergies  Allergen Reactions   Indomethacin Nausea Only and Other (See Comments)    Stomach ache   Indomethacin    Statins Other (See Comments)    Muscle pain, fatigue and dizziness    History of Present Illness     ZIXUAN MACKLIN has a PMH of GERD, chronic cholecystitis, OA of knee, urethral calculus, palpitations, diastolic dysfunction, hyperlipidemia.  He had stress echo 5/10 which  showed diastolic dysfunction but no EKG changes and no wall motion abnormalities.  He underwent nuclear stress test 7/12 which showed an EF of 66% and normal perfusion.  He had exercise treadmill test 11/15 which was negative.  He wore a cardiac event monitor 6/23 which showed sinus rhythm with occasional PACs, runs of SVT, occasional PVCs and rare couplets.  He was seen in follow-up by Dr. Jens Som on  07/11/2022.  During that time he denied dyspnea, chest pain, palpitations and syncope.  He reported that his Crestor had been discontinued due to muscle aches fatigue and dizziness.  He was started on ezetimibe.  He presents to the clinic today for follow-up evaluation and states he does not do any formal exercise but continues to be physically active doing yard work and jobs around [employer].  He denies chest pain.  He denies recurrent episodes of palpitations.  He does note some congestion at night but denies changes with his breathing through the day.  He has been using some exercise bands periodically.  We reviewed his last cholesterol readings and he expressed understanding.  His EKG today shows sinus rhythm right bundle branch block 69 bpm.  I will give him the high-fiber diet information, have him increase his physical activity as tolerated and plan follow-up in 12 months..  Today he denies chest pain, shortness of breath, lower extremity edema, fatigue, palpitations, melena, hematuria, hemoptysis, diaphoresis, weakness, presyncope, syncope, orthopnea, and PND.    Home Medications    Prior to Admission medications   Medication Sig Start Date End Date Taking? Authorizing Provider  azelastine (ASTELIN) 0.1 % nasal spray Place 1 spray into both nostrils 2 (two) times daily as needed for allergies (runny nose). 01/26/23   Raliegh Ip, DO  Cholecalciferol (DIALYVITE VITAMIN D 5000) 125 MCG (5000 UT) capsule Take 5,000 Units by mouth daily.    [provider]  desloratadine (CLARINEX) 5 MG tablet Take 1 tablet (5 mg total) by mouth daily. For allergies/ runny nose 01/26/23   Delynn Flavin M, DO  ezetimibe (ZETIA) 10 MG tablet Take 1 tablet (10 mg total) by mouth daily. 07/11/22   Lewayne Bunting, MD  hydrocortisone (ANUSOL-HC) 2.5 % rectal cream Place 1 Application rectally 2 (two) times daily. 10/07/22   Daphine Deutscher, Mary-Margaret, FNP  omeprazole (PRILOSEC) 20 MG capsule Take 1  capsule (20 mg total) by mouth daily as needed. 01/26/23   Raliegh Ip, DO  oxyCODONE (ROXICODONE) 5 MG immediate release tablet Take 1 tablet (5 mg total) by mouth every 6 (six) hours as needed for severe pain. Patient not taking: Reported on 01/26/2023 01/19/23   Despina Arias, MD  tamsulosin (FLOMAX) 0.4 MG CAPS capsule Take 1 capsule (0.4 mg total) by mouth daily after supper. 01/19/23   Despina Arias, MD    Family History    Family History  Problem Relation Age of Onset   Heart failure Mother    Aortic stenosis Mother    Heart disease Mother    Hypertension Father    Dementia Father    Arthritis Sister    Hypertension Sister    Healthy Daughter    Arthritis Daughter    He indicated that his mother is deceased. He indicated that his father is alive. He indicated that his sister is alive. He indicated that his maternal grandmother is deceased. He indicated that his maternal grandfather is deceased. He indicated that his paternal grandmother is deceased. He indicated that his paternal grandfather is  deceased. He indicated that his daughter is alive.  Social History    Social History   Socioeconomic History   Marital status: Married    Spouse name: Osvaldo Human   Number of children: 1   Years of education: 13   Highest education level: Some college, no degree  Occupational History   Occupation: Landscape architect: [employer]    Comment: Retired   Occupation: Advertising account planner    Comment: for 13 years  Tobacco Use   Smoking status: Never   Smokeless tobacco: Never  Vaping Use   Vaping status: Never Used  Substance and Sexual Activity   Alcohol use: No   Drug use: No   Sexual activity: Not Currently  Other Topics Concern   Not on file  Social History Narrative   One level living with his wife. They have a basement accessible from outdoors.   Daughter lives in [place].    Social Determinants of Health   Financial Resource Strain: Low Risk  (05/13/2021)   Overall  Financial Resource Strain (CARDIA)    Difficulty of Paying Living Expenses: Not hard at all  Food Insecurity: No Food Insecurity (05/13/2021)   Hunger Vital Sign    Worried About Running Out of Food in the Last Year: Never true    Ran Out of Food in the Last Year: Never true  Transportation Needs: No Transportation Needs (05/13/2021)   PRAPARE - Administrator, Civil Service (Medical): No    Lack of Transportation (Non-Medical): No  Physical Activity: Insufficiently Active (05/13/2021)   Exercise Vital Sign    Days of Exercise per Week: 7 days    Minutes of Exercise per Session: 10 min  Stress: No Stress Concern Present (05/13/2021)   [employer] of Occupational Health - Occupational Stress Questionnaire    Feeling of Stress : Only a little  Social Connections: Moderately Integrated (05/13/2021)   Social Connection and Isolation Panel [NHANES]    Frequency of Communication with Friends and Family: More than three times a week    Frequency of Social Gatherings with Friends and Family: Three times a week    Attends Religious Services: More than 4 times per year    Active Member of Clubs or Organizations: No    Attends Banker Meetings: Never    Marital Status: Married  Catering manager Violence: Not At Risk (05/13/2021)   Humiliation, Afraid, Rape, and Kick questionnaire    Fear of Current or Ex-Partner: No    Emotionally Abused: No    Physically Abused: No    Sexually Abused: No     Review of Systems    General:  No chills, fever, night sweats or weight changes.  Cardiovascular:  No chest pain, dyspnea on exertion, edema, orthopnea, palpitations, paroxysmal nocturnal dyspnea. Dermatological: No rash, lesions/masses Respiratory: No cough, dyspnea Urologic: No hematuria, dysuria Abdominal:   No nausea, vomiting, diarrhea, bright red blood per rectum, melena, or hematemesis Neurologic:  No visual changes, wkns, changes in mental status. All other  systems reviewed and are otherwise negative except as noted above.  Physical Exam    VS:  BP 130/62 (BP Location: Left Arm, Patient Position: Sitting, Cuff Size: Normal)   Pulse 69   Ht 5\' 11"  (1.803 m)   Wt 216 lb (98 kg)   SpO2 94%   BMI 30.13 kg/m  , BMI Body mass index is 30.13 kg/m. GEN: Well nourished, well developed, in no acute distress. HEENT: normal. Neck: Supple,  no JVD, carotid bruits, or masses. Cardiac: RRR, no murmurs, rubs, or gallops. No clubbing, cyanosis, edema.  Radials/DP/PT 2+ and equal bilaterally.  Respiratory:  Respirations regular and unlabored, clear to auscultation bilaterally. GI: Soft, nontender, nondistended, BS + x 4. MS: no deformity or atrophy. Skin: warm and dry, no rash. Neuro:  Strength and sensation are intact. Psych: Normal affect.  Accessory Clinical Findings    Recent Labs: 01/26/2023: ALT 9; BUN 13; Creatinine, Ser 0.97; Hemoglobin 15.2; Platelets 267; Potassium 4.5; Sodium 141   Recent Lipid Panel    Component Value Date/Time   CHOL 157 09/11/2022 0854   TRIG 96 09/11/2022 0854   HDL 48 09/11/2022 0854   CHOLHDL 3.3 09/11/2022 0854   CHOLHDL 4 09/02/2012 1001   VLDL 23.6 09/02/2012 1001   LDLCALC 91 09/11/2022 0854   LDLDIRECT 52 07/11/2021 1518         ECG personally reviewed by me today- EKG Interpretation Date/Time:  Monday July 06 2023 13:34:47 EST Ventricular Rate:  69 PR Interval:  126 QRS Duration:  108 QT Interval:  396 QTC Calculation: 424 R Axis:   -17  Text Interpretation: Normal sinus rhythm Right bundle branch block When compared with ECG of 26-May-2011 09:17, No significant change was found Confirmed by Edd Fabian [phone]) on 07/06/2023 1:39:42 PM   Stress echocardiogram 12/08/2008 Scanned into epic   Cardiac event monitor 01/31/2022  Patch Wear Time:  13 days and 21 hours (2023-06-08T19:58:28-0400 to 2023-06-22T17:36:26-0400)   Patient had a min HR of 49 bpm, max HR of 184 bpm, and avg HR of 74  bpm. Predominant underlying rhythm was Sinus Rhythm. 38 Supraventricular Tachycardia runs occurred, the run with the fastest interval lasting 7 beats with a max rate of 184 bpm, the  longest lasting 16 mins 56 secs with an avg rate of 122 bpm. Isolated SVEs were rare (<1.0%), SVE Couplets were rare (<1.0%), and SVE Triplets were rare (<1.0%). Isolated VEs were rare (<1.0%), VE Couplets were rare (<1.0%), and no VE Triplets were  present. Ventricular Trigeminy was present.    Sinus bradycardia, normal sinus rhythm, sinus tachycardia, occasional PAC, runs of SVT, occasional PVC and rare couplet. Olga Millers, MD     Assessment & Plan   1.  Palpitations-EKG today shows sinus rhythm 69 BPM .  Previously wore cardiac event monitor which showed occasional PACs, runs of SVT and occasional PVCs. Avoid triggers caffeine, chocolate, EtOH, dehydration etc. Increase physical activity as tolerated  History of chest discomfort-denies recent episodes of chest pain.  Prior stress testing reassuring. No plans for ischemic evaluation at this time. Continue lifestyle modification  HTN-BP today 130/62 . Maintain blood pressure log Heart healthy low-sodium diet Maintain physical activity   Hyperlipidemia- 09/11/2022: Cholesterol, Total 157; HDL 48; LDL Chol Calc (NIH) 91; Triglycerides 96 High-fiber diet Increase physical activity as tolerated Continue ezetimibe   Disposition: Follow-up with Dr. Jens Som or me in 12 months.   Thomasene Ripple. Cherrise Occhipinti NP-C     07/06/2023, 1:40 PM [facility] [address] Office [phone] Fax [phone]    I spent 14 minutes examining this patient, reviewing medications, and using patient centered shared decision making involving her cardiac care.   I spent greater than 20 minutes reviewing her past medical history,  medications, and prior cardiac tests.

## 2023-07-06 ENCOUNTER — Encounter: Payer: Self-pay | Admitting: General Practice

## 2023-07-06 ENCOUNTER — Ambulatory Visit: Payer: Medicare Other | Attending: General Practice | Admitting: General Practice

## 2023-07-06 VITALS — BP 130/62 | HR 69 | Ht 71.0 in | Wt 216.0 lb

## 2023-07-06 DIAGNOSIS — R002 Palpitations: Secondary | ICD-10-CM | POA: Diagnosis not present

## 2023-07-06 DIAGNOSIS — R079 Chest pain, unspecified: Secondary | ICD-10-CM | POA: Insufficient documentation

## 2023-07-06 DIAGNOSIS — I10 Essential (primary) hypertension: Secondary | ICD-10-CM | POA: Insufficient documentation

## 2023-07-06 DIAGNOSIS — E7800 Pure hypercholesterolemia, unspecified: Secondary | ICD-10-CM | POA: Insufficient documentation

## 2023-07-06 NOTE — Patient Instructions (Signed)
Medication Instructions:  No changes  *If you need a refill on your cardiac medications before your next appointment, please call your pharmacy*     Follow-Up: At [facility], you and your health needs are our priority.  As part of our continuing mission to provide you with exceptional heart care, we have created designated Provider Care Teams.  These Care Teams include your primary Cardiologist (physician) and Advanced Practice Providers (APPs -  Physician Assistants and Nurse Practitioners) who all work together to provide you with the care you need, when you need it.  We recommend signing up for the patient portal called "MyChart".  Sign up information is provided on this After Visit Summary.  MyChart is used to connect with patients for Virtual Visits (Telemedicine).  Patients are able to view lab/test results, encounter notes, upcoming appointments, etc.  Non-urgent messages can be sent to your provider as well.   To learn more about what you can do with MyChart, go to [email or web address].    Your next appointment:   12 month(s)  Provider:   Olga Millers, MD      Increase Physical Activity as tolerated    High-Fiber Eating Plan Fiber, also called dietary fiber, is found in foods such as fruits, vegetables, whole grains, and beans. A high-fiber diet can be good for your health. Your health care provider may recommend a high-fiber diet to help: Prevent trouble pooping (constipation). Lower your cholesterol. Treat the following conditions: Hemorrhoids. This is inflammation of veins in the anus. Inflammation of specific areas of the digestive tract. Irritable bowel syndrome (IBS). This is a problem of the large intestine, also called the colon, that sometimes causes belly pain and bloating. Prevent overeating as part of a weight-loss plan. Lower the risk of heart disease, type 2 diabetes, and certain cancers. What are tips for following this plan? Reading food  labels  Check the nutrition facts label on foods for the amount of dietary fiber. Choose foods that have 4 grams of fiber or more per serving. The recommended goals for how much fiber you should eat each day include: Males 66 years old or younger: 30-34 g. Males over 72 years old: 28-34 g. Females 40 years old or younger: 25-28 g. Females over 87 years old: 22-25 g. Your daily fiber goal is _____________ g. Shopping Choose whole fruits and vegetables instead of processed. For example, choose apples instead of apple juice or applesauce. Choose a variety of high-fiber foods such as avocados, lentils, oats, and pinto beans. Read the nutrition facts label on foods. Check for foods with added fiber. These foods often have high sugar and salt (sodium) amounts per serving. Cooking Use whole-grain flour for baking and cooking. Cook with brown rice instead of white rice. Make meals that have a lot of beans and vegetables in them, such as chili or vegetable-based soups. Meal planning Start the day with a breakfast that is high in fiber, such as a cereal that has 5 g of fiber or more per serving. Eat breads and cereals that are made with whole-grain flour instead of refined flour or white flour. Eat brown rice, bulgur wheat, or millet instead of white rice. Use beans in place of meat in soups, salads, and pasta dishes. Be sure that half of the grains you eat each day are whole grains. General information You can get the recommended amount of dietary fiber by: Eating a variety of fruits, vegetables, grains, nuts, and beans. Taking a fiber supplement if you  aren't able to eat enough fiber. It's better to get fiber through food than from a supplement. Slowly increase how much fiber you eat. If you increase the amount of fiber you eat too quickly, you may have bloating, cramping, or gas. Drink plenty of water to help you digest fiber. Choose high-fiber snacks, such as berries, raw vegetables, nuts, and  popcorn. What foods should I eat? Fruits Berries. Pears. Apples. Oranges. Avocado. Prunes and raisins. Dried figs. Vegetables Sweet potatoes. Spinach. Kale. Artichokes. Cabbage. Broccoli. Cauliflower. Green peas. Carrots. Squash. Grains Whole-grain breads. Multigrain cereal. Oats and oatmeal. Brown rice. Barley. Bulgur wheat. Millet. Quinoa. Bran muffins. Popcorn. Rye wafer crackers. Meats and other proteins Navy beans, kidney beans, and pinto beans. Soybeans. Split peas. Lentils. Nuts and seeds. Dairy Fiber-fortified yogurt. Fortified means that fiber has been added to the product. Beverages Fiber-fortified soy milk. Fiber-fortified orange juice. Other foods Fiber bars. The items listed above may not be all the foods and drinks you can have. Talk to a dietitian to learn more. What foods should I avoid? Fruits Fruit juice. Cooked, strained fruit. Vegetables Fried potatoes. Canned vegetables. Well-cooked vegetables. Grains White bread. Pasta made with refined flour. White rice. Meats and other proteins Fatty meat. Fried chicken or fried fish. Dairy Milk. Cream cheese. Sour cream. Fats and oils Butters. Beverages Soft drinks. Other foods Cakes and pastries. The items listed above may not be all the foods and drinks you should avoid. Talk to a dietitian to learn more. This information is not intended to replace advice given to you by your health care provider. Make sure you discuss any questions you have with your health care provider. Document Revised: 10/13/2022 Document Reviewed: 10/13/2022 Elsevier Patient Education  2024 [employer].

## 2023-10-09 ENCOUNTER — Ambulatory Visit: Payer: Self-pay | Admitting: Family Medicine

## 2023-10-09 NOTE — Telephone Encounter (Signed)
 Copied From CRM [id number].  Reason for Triage: pain lower abdomin and groin area right side .Jake Barnett [phone]  Reason for Disposition  Abdominal pain is a chronic symptom (recurrent or ongoing AND present > 4 weeks)  Answer Assessment - Initial Assessment Questions 1. LOCATION: "Where does it hurt?"      Right- ongoing pain-worse 2. RADIATION: "Does the pain shoot anywhere else?" (e.g., chest, back)     No radiation 3. ONSET: "When did the pain begin?" (Minutes, hours or days ago)      Ongoing- hernia repair hx- 1-2 weeks after surgery, hurts mainly with movement -08/2015  5. PATTERN "Does the pain come and go, or is it constant?"    - If it comes and goes: "How long does it last?" "Do you have pain now?"     (Note: Comes and goes means the pain is intermittent. It goes away completely between bouts.)    - If constant: "Is it getting better, staying the same, or getting worse?"      (Note: Constant means the pain never goes away completely; most serious pain is constant and gets worse.)      Comes and goes 6. SEVERITY: "How bad is the pain?"  (e.g., Scale 1-10; mild, moderate, or severe)    - MILD (1-3): Doesn't interfere with normal activities, abdomen soft and not tender to touch.     - MODERATE (4-7): Interferes with normal activities or awakens from sleep, abdomen tender to touch.     - SEVERE (8-10): Excruciating pain, doubled over, unable to do any normal activities.       Mild/moderate- with movement 7. RECURRENT SYMPTOM: "Have you ever had this type of stomach pain before?" If Yes, ask: "When was the last time?" and "What happened that time?"      Yes- hernia 8. CAUSE: "What do you think is causing the stomach pain?"     Patient thinks he may be having hernia repair pain 9. RELIEVING/AGGRAVATING FACTORS: "What makes it better or worse?" (e.g., antacids, bending or twisting motion, bowel movement)     Ibuprofen helps 10. OTHER SYMPTOMS: "Do you have any other symptoms?" (e.g., back  pain, diarrhea, fever, urination pain, vomiting)       no  Protocols used: Abdominal Pain - Male-A-AH

## 2023-10-09 NOTE — Telephone Encounter (Signed)
  Chief Complaint: chronic LRQ abdominal pain  Symptoms: pain with movement- at hernia repair site (2017) getting worse- hurts when patient moves Frequency: chronic- patient states he has had pain in the area since his surgery in 2017- but seems to be increasing Pertinent Negatives: Patient denies back pain, diarrhea, fever, urination pain, vomiting  Disposition: [] ED /[] Urgent Care (no appt availability in office) / [x] Appointment(In office/virtual)/ []  [facility]/ [] Home Care/ [] Refused Recommended Disposition /[] [facility]/ []  Follow-up with PCP Additional Notes: Patient states if the pain continues- he will not be able to move- he believes it has gotten worse over time. Appointment scheduled to evaluate

## 2023-10-12 ENCOUNTER — Ambulatory Visit (INDEPENDENT_AMBULATORY_CARE_PROVIDER_SITE_OTHER): Admitting: Family Medicine

## 2023-10-12 ENCOUNTER — Ambulatory Visit: Admitting: Family Medicine

## 2023-10-12 ENCOUNTER — Encounter: Payer: Self-pay | Admitting: Family Medicine

## 2023-10-12 VITALS — BP 130/71 | HR 77 | Temp 98.2°F | Ht 71.0 in | Wt 212.0 lb

## 2023-10-12 DIAGNOSIS — M545 Low back pain, unspecified: Secondary | ICD-10-CM | POA: Diagnosis not present

## 2023-10-12 DIAGNOSIS — G8929 Other chronic pain: Secondary | ICD-10-CM

## 2023-10-12 DIAGNOSIS — R1031 Right lower quadrant pain: Secondary | ICD-10-CM | POA: Diagnosis not present

## 2023-10-12 MED ORDER — DICLOFENAC SODIUM 75 MG PO TBEC: 75.0000 mg | DELAYED_RELEASE_TABLET | Freq: Two times a day (BID) | ORAL | 2 refills | Status: DC

## 2023-10-12 NOTE — Progress Notes (Signed)
 Subjective:  Patient ID: Jake Barnett, male    DOB: 26-Dec-1940  Age: 83 y.o. MRN: [id number]  CC: Groin Pain (Right side groin pain for 8 years ago when he had hernia repair. Seems to be getting worse. Mainly when turning, twisting and bending. No bulging. Feels like something is pulling. )   HPI Jake Barnett presents for abd pain since bilateral inguinal hernia repair in 2016. Pain now in right groin with twisting to the left. No other triggers. Also history of multiple kidney stone surgeries. 7/10 eased by returning to neutral position. Bending over causes 5-6/10     10/12/2023    9:59 AM 01/26/2023   10:18 AM 10/07/2022   10:57 AM  Depression screen PHQ 2/9  Decreased Interest 0 0 0  Down, Depressed, Hopeless 0 0 0  PHQ - 2 Score 0 0 0  Altered sleeping 0 0 0  Tired, decreased energy 0 0 1  Change in appetite 0 0 0  Feeling bad or failure about yourself  0 0 0  Trouble concentrating 0 0 0  Moving slowly or fidgety/restless 0 0 0  Suicidal thoughts 0 0 0  PHQ-9 Score 0 0 1  Difficult doing work/chores Not difficult at all  Not difficult at all    History Jake Barnett has a past medical history of Arthritis, Cancer (HCC), Cholelithiasis with chronic cholecystitis (12/04/2015), Chronic kidney disease, Difficult intubation (08/24/2015), GERD (gastroesophageal reflux disease), History of kidney stones, Hyperlipidemia, Left ureteral calculus, Mild diastolic dysfunction, Palpitations, PONV (postoperative nausea and vomiting), Postoperative anemia due to acute blood loss (04/19/2013), and RBBB.   He has a past surgical history that includes Total knee arthroplasty (Left, 04/18/2013); CYSTO/ BILATERAL LASER LITHOTRIPSY WITH STONE EXTRACTIONS (05-26-2011); transthoracic echocardiogram (08-07-2008); Cardiovascular stress test (02-11-2011   DR CRENSHAW); Knee arthroscopy (Right, 2008); Shoulder arthroscopy (Right, 2007); Anterior cervical decomp/discectomy fusion (2001); Tympanoplasty (Right, 1971);  Cystoscopy w/ ureteral stent placement (Left, 05/13/2013); Cystoscopy with retrograde pyelogram, ureteroscopy and stent placement (Left, 06/15/2013); Holmium laser application (Left, 06/15/2013); Inguinal hernia repair (Bilateral, 08/24/2015); Insertion of mesh (Bilateral, 08/24/2015); Cholecystectomy (N/A, 12/04/2015); and Extracorporeal shock wave lithotripsy (Right, 01/19/2023).   His family history includes Aortic stenosis in his mother; Arthritis in his daughter and sister; Dementia in his father; Healthy in his daughter; Heart disease in his mother; Heart failure in his mother; Hypertension in his father and sister.He reports that he has never smoked. He has never used smokeless tobacco. He reports that he does not drink alcohol and does not use drugs.    ROS Review of Systems  Constitutional:  Negative for fever.  Respiratory:  Negative for shortness of breath.   Cardiovascular:  Negative for chest pain.  Musculoskeletal:  Positive for arthralgias and back pain.  Skin:  Negative for rash.    Objective:  BP 130/71   Pulse 77   Temp 98.2 F (36.8 C)   Ht 5\' 11"  (1.803 m)   Wt 212 lb (96.2 kg)   SpO2 95%   BMI 29.57 kg/m   BP Readings from Last 3 Encounters:  10/12/23 130/71  07/06/23 130/62  01/26/23 130/78    Wt Readings from Last 3 Encounters:  10/12/23 212 lb (96.2 kg)  07/06/23 216 lb (98 kg)  01/26/23 210 lb (95.3 kg)     Physical Exam Vitals reviewed.  Constitutional:      Appearance: He is well-developed.  HENT:     Head: Normocephalic and atraumatic.     Right Ear: External  ear normal.     Left Ear: External ear normal.     Mouth/Throat:     Pharynx: No oropharyngeal exudate or posterior oropharyngeal erythema.  Eyes:     Pupils: Pupils are equal, round, and reactive to light.  Cardiovascular:     Rate and Rhythm: Normal rate and regular rhythm.     Heart sounds: No murmur heard. Pulmonary:     Effort: No respiratory distress.     Breath sounds: Normal  breath sounds.  Musculoskeletal:     Cervical back: Normal range of motion and neck supple.  Neurological:     Mental Status: He is alert and oriented to person, place, and time.       Assessment & Plan:   [name] was seen today for groin pain.  Diagnoses and all orders for this visit:  Groin pain, chronic, right -     MR Pelvis W Wo Contrast; Future  Chronic bilateral low back pain without sciatica  Other orders -     diclofenac (VOLTAREN) 75 MG EC tablet; Take 1 tablet (75 mg total) by mouth 2 (two) times daily. For muscle and  Joint pain       I have discontinued Lucita Lora. Jake Barnett's oxyCODONE, tamsulosin, desloratadine, and cetirizine. I am also having him start on diclofenac. Additionally, I am having him maintain his Dialyvite Vitamin D 5000, hydrocortisone, omeprazole, azelastine, ezetimibe, and docusate sodium.  Allergies as of 10/12/2023       Reactions   Indomethacin Nausea Only, Other (See Comments)   Stomach ache   Indomethacin    Statins Other (See Comments)   Muscle pain, fatigue and dizziness        Medication List        Accurate as of October 12, 2023 10:31 AM. If you have any questions, ask your nurse or doctor.          STOP taking these medications    cetirizine 5 MG chewable tablet Commonly known as: ZYRTEC Stopped by: Lawerence Dery   desloratadine 5 MG tablet Commonly known as: Clarinex Stopped by: Kunio Cummiskey   oxyCODONE 5 MG immediate release tablet Commonly known as: Roxicodone Stopped by: Aggie Douse   tamsulosin 0.4 MG Caps capsule Commonly known as: FLOMAX Stopped by: Kylyn Mcdade       TAKE these medications    azelastine 0.1 % nasal spray Commonly known as: ASTELIN Place 1 spray into both nostrils 2 (two) times daily as needed for allergies (runny nose).   Dialyvite Vitamin D 5000 125 MCG (5000 UT) capsule Generic drug: Cholecalciferol Take 5,000 Units by mouth daily.   diclofenac 75 MG EC tablet Commonly  known as: VOLTAREN Take 1 tablet (75 mg total) by mouth 2 (two) times daily. For muscle and  Joint pain Started by: Randeep Biondolillo   ezetimibe 10 MG tablet Commonly known as: ZETIA TAKE 1 TABLET BY MOUTH EVERY DAY   hydrocortisone 2.5 % rectal cream Commonly known as: Anusol-HC Place 1 Application rectally 2 (two) times daily.   omeprazole 20 MG capsule Commonly known as: PRILOSEC Take 1 capsule (20 mg total) by mouth daily as needed.   Stool Softener 100 MG capsule Generic drug: docusate sodium as needed.         Follow-up: Return in about 1 month (around 11/12/2023).  Mechele Claude, M.D.

## 2023-11-06 DIAGNOSIS — L570 Actinic keratosis: Secondary | ICD-10-CM | POA: Diagnosis not present

## 2023-11-06 DIAGNOSIS — L853 Xerosis cutis: Secondary | ICD-10-CM | POA: Diagnosis not present

## 2023-11-06 DIAGNOSIS — L578 Other skin changes due to chronic exposure to nonionizing radiation: Secondary | ICD-10-CM | POA: Diagnosis not present

## 2023-11-06 DIAGNOSIS — R233 Spontaneous ecchymoses: Secondary | ICD-10-CM | POA: Diagnosis not present

## 2023-11-06 DIAGNOSIS — L300 Nummular dermatitis: Secondary | ICD-10-CM | POA: Diagnosis not present

## 2023-11-06 DIAGNOSIS — L820 Inflamed seborrheic keratosis: Secondary | ICD-10-CM | POA: Diagnosis not present

## 2023-11-16 ENCOUNTER — Ambulatory Visit: Admitting: Family Medicine

## 2023-11-18 ENCOUNTER — Encounter: Payer: Self-pay | Admitting: Family Medicine

## 2023-11-18 ENCOUNTER — Ambulatory Visit: Admitting: Family Medicine

## 2023-11-18 VITALS — BP 131/65 | HR 74 | Temp 98.2°F | Ht 71.0 in | Wt 219.0 lb

## 2023-11-18 DIAGNOSIS — R1031 Right lower quadrant pain: Secondary | ICD-10-CM

## 2023-11-18 DIAGNOSIS — J302 Other seasonal allergic rhinitis: Secondary | ICD-10-CM

## 2023-11-18 DIAGNOSIS — G8929 Other chronic pain: Secondary | ICD-10-CM | POA: Diagnosis not present

## 2023-11-18 LAB — BASIC METABOLIC PANEL WITH GFR
BUN/Creatinine Ratio: 19 (ref 10–24)
BUN: 19 mg/dL (ref 8–27)
CO2: 22 mmol/L (ref 20–29)
Calcium: 9.2 mg/dL (ref 8.6–10.2)
Chloride: 105 mmol/L (ref 96–106)
Creatinine, Ser: 1.01 mg/dL (ref 0.76–1.27)
Glucose: 92 mg/dL (ref 70–99)
Potassium: 4.2 mmol/L (ref 3.5–5.2)
Sodium: 141 mmol/L (ref 134–144)
eGFR: 75 mL/min/{1.73_m2} (ref >59)

## 2023-11-18 NOTE — Progress Notes (Signed)
 Subjective: CC: Follow-up pelvic pain PCP: Raliegh Ip, DO WUJ:WJXBJ LLOYDE Barnett is a 83 y.o. male presenting to clinic today for:  1.  Right pelvic pain Patient was seen 1 month ago by Dr. Darlyn Read who started him on oral diclofenac for right sided pelvic pain.  At that time he ordered MRI with and without contrast but he did not actually receive a call until today to get that set up.  He is not sure if it is okay for him to get MRI with his titanium knee and C-spine but will make sure to mention this to the MRI technician in case anything needs to be adjusted.  He does not have any cards indicating that he cannot have MRI.  He reports about 85% improvement with the diclofenac.  He reports no abdominal pain, nausea, vomiting, hematochezia, melena, increased GERD.  He is compliant with his Prilosec.  No difficulty with urine output or bowel movements.  He denies any palpable bulges in area of concern nor any tenderness to palpation  2.  Allergic rhinitis He reports that his sinuses are about 75% better with the Astelin but it still does run sometimes   ROS: Per HPI  Allergies  Allergen Reactions   Indomethacin Nausea Only and Other (See Comments)    Stomach ache   Indomethacin    Statins Other (See Comments)    Muscle pain, fatigue and dizziness   Past Medical History:  Diagnosis Date   Arthritis    Cancer (HCC)    hx of basal cell skin cancer    Cholelithiasis with chronic cholecystitis 12/04/2015   Chronic kidney disease    kidney stones   Difficult intubation 08/24/2015   GERD (gastroesophageal reflux disease)    History of kidney stones    Hyperlipidemia    Left ureteral calculus    Mild diastolic dysfunction    AYMPTOMATIC   Palpitations    occasional PAC/PVC.   PONV (postoperative nausea and vomiting)    Postoperative anemia due to acute blood loss 04/19/2013   RBBB     Current Outpatient Medications:    azelastine (ASTELIN) 0.1 % nasal spray, Place 1 spray  into both nostrils 2 (two) times daily as needed for allergies (runny nose)., Disp: 30 mL, Rfl: 12   Cholecalciferol (DIALYVITE VITAMIN D 5000) 125 MCG (5000 UT) capsule, Take 5,000 Units by mouth daily., Disp: , Rfl:    diclofenac (VOLTAREN) 75 MG EC tablet, Take 1 tablet (75 mg total) by mouth 2 (two) times daily. For muscle and  Joint pain, Disp: 60 tablet, Rfl: 2   docusate sodium (STOOL SOFTENER) 100 MG capsule, as needed., Disp: , Rfl:    ezetimibe (ZETIA) 10 MG tablet, TAKE 1 TABLET BY MOUTH EVERY DAY, Disp: 90 tablet, Rfl: 3   hydrocortisone (ANUSOL-HC) 2.5 % rectal cream, Place 1 Application rectally 2 (two) times daily., Disp: 30 g, Rfl: 0   omeprazole (PRILOSEC) 20 MG capsule, Take 1 capsule (20 mg total) by mouth daily as needed., Disp: 90 capsule, Rfl: 3 Social History   Socioeconomic History   Marital status: Married    Spouse name: Osvaldo Human   Number of children: 1   Years of education: 13   Highest education level: Some college, no degree  Occupational History   Occupation: Landscape architect: [employer]    Comment: Retired   Occupation: Advertising account planner    Comment: for 13 years  Tobacco Use   Smoking status: Never  Smokeless tobacco: Never  Vaping Use   Vaping status: Never Used  Substance and Sexual Activity   Alcohol use: No   Drug use: No   Sexual activity: Not Currently  Other Topics Concern   Not on file  Social History Narrative   One level living with his wife. They have a basement accessible from outdoors.   Daughter lives in [place].    Social Drivers of Corporate investment banker Strain: Low Risk  (05/13/2021)   Overall Financial Resource Strain (CARDIA)    Difficulty of Paying Living Expenses: Not hard at all  Food Insecurity: No Food Insecurity (05/13/2021)   Hunger Vital Sign    Worried About Running Out of Food in the Last Year: Never true    Ran Out of Food in the Last Year: Never true  Transportation Needs: No Transportation Needs (05/13/2021)    PRAPARE - Administrator, Civil Service (Medical): No    Lack of Transportation (Non-Medical): No  Physical Activity: Insufficiently Active (05/13/2021)   Exercise Vital Sign    Days of Exercise per Week: 7 days    Minutes of Exercise per Session: 10 min  Stress: No Stress Concern Present (05/13/2021)   [employer] of Occupational Health - Occupational Stress Questionnaire    Feeling of Stress : Only a little  Social Connections: Moderately Integrated (05/13/2021)   Social Connection and Isolation Panel [NHANES]    Frequency of Communication with Friends and Family: More than three times a week    Frequency of Social Gatherings with Friends and Family: Three times a week    Attends Religious Services: More than 4 times per year    Active Member of Clubs or Organizations: No    Attends Banker Meetings: Never    Marital Status: Married  Catering manager Violence: Not At Risk (05/13/2021)   Humiliation, Afraid, Rape, and Kick questionnaire    Fear of Current or Ex-Partner: No    Emotionally Abused: No    Physically Abused: No    Sexually Abused: No   Family History  Problem Relation Age of Onset   Heart failure Mother    Aortic stenosis Mother    Heart disease Mother    Hypertension Father    Dementia Father    Arthritis Sister    Hypertension Sister    Healthy Daughter    Arthritis Daughter     Objective: Office vital signs reviewed. BP 131/65   Pulse 74   Temp 98.2 F (36.8 C)   Ht 5\' 11"  (1.803 m)   Wt 219 lb (99.3 kg)   SpO2 97%   BMI 30.54 kg/m   Physical Examination:  General: Awake, alert, well nourished, No acute distress HEENT: No gross sinus drainage. Cardio: regular rate and rhythm, S1S2 heard, no murmurs appreciated Pulm: clear to auscultation bilaterally, no wheezes, rhonchi or rales; normal work of breathing on room air   Assessment/ Plan: 83 y.o. male   Groin pain, chronic, right - Plan: Basic Metabolic  Panel  Seasonal allergic rhinitis, unspecified trigger  MRI scheduled.  Check renal function given use of NSAID.  Ongoing use of Astelin recommended since this is improving his symptoms.  Further recommendations pending results of MRI.  Okay to continue NSAID for now.  Discussed need for PPI use to protect stomach and we discussed cardiovascular risk associated with NSAID.  I think utilizing it sparingly is appropriate at this time   Eliodoro Guerin, DO [facility] [phone]

## 2023-11-19 ENCOUNTER — Ambulatory Visit: Admitting: Family

## 2023-11-19 ENCOUNTER — Ambulatory Visit (HOSPITAL_COMMUNITY)
Admission: RE | Admit: 2023-11-19 | Discharge: 2023-11-19 | Disposition: A | Discharge status: Home / Self Care | Source: Ambulatory Visit | Attending: Family Medicine | Admitting: Family Medicine

## 2023-11-19 DIAGNOSIS — G8929 Other chronic pain: Secondary | ICD-10-CM | POA: Diagnosis not present

## 2023-11-19 DIAGNOSIS — R1031 Right lower quadrant pain: Secondary | ICD-10-CM | POA: Diagnosis not present

## 2023-11-19 DIAGNOSIS — M160 Bilateral primary osteoarthritis of hip: Secondary | ICD-10-CM | POA: Diagnosis not present

## 2023-11-19 DIAGNOSIS — R102 Pelvic and perineal pain: Secondary | ICD-10-CM | POA: Diagnosis not present

## 2023-11-19 DIAGNOSIS — K5730 Diverticulosis of large intestine without perforation or abscess without bleeding: Secondary | ICD-10-CM | POA: Diagnosis not present

## 2023-11-19 DIAGNOSIS — R935 Abnormal findings on diagnostic imaging of other abdominal regions, including retroperitoneum: Secondary | ICD-10-CM | POA: Diagnosis not present

## 2023-11-19 MED ORDER — GADOBUTROL 1 MMOL/ML IV SOLN
10.0000 mL | Freq: Once | INTRAVENOUS | Status: AC | PRN
  Administered 2023-11-19: 10 mL via INTRAVENOUS

## 2023-11-30 ENCOUNTER — Telehealth: Payer: Self-pay

## 2023-11-30 NOTE — Telephone Encounter (Unsigned)
 Copied from CRM [id number]. Topic: Clinical - Medical Advice >> Nov 30, 2023  2:18 PM Elle L wrote: Reason for CRM: The patient states his The patient states he discussed his diclofenac  (VOLTAREN ) 75 MG EC tablet being changed with Dr. Bonnell Butcher but he is unsure if he is supposed to lower, raise, or keep the same dosage. He is also requesting the results of his MRI. The patient's call back number is [phone].

## 2023-11-30 NOTE — Telephone Encounter (Signed)
 Copied from CRM [id number]. Topic: Clinical - Medical Advice >> Nov 30, 2023  2:18 PM Elle L wrote: Reason for CRM: The patient states his The patient states he discussed his diclofenac  (VOLTAREN ) 75 MG EC tablet being changed with Dr. Bonnell Butcher but he is unsure if he is supposed to lower, raise, or keep the same dosage. He is also requesting the results of his MRI. The patient's call back number is [phone].

## 2023-12-01 NOTE — Telephone Encounter (Signed)
 Please see his last office note which delineates that we agreed to continue the Voltaren  for him since this was working well for him.  Additionally, the MRI was ordered by Dr. Veleta Gerold and whilst it was obtained on 17 April the radiologist has not yet read it.  He will be contacted once the result is available to us 

## 2023-12-01 NOTE — Telephone Encounter (Signed)
 Left detailed message stating to continue Voltaren  and no dosage change. Mri has not resulted. LS

## 2024-01-03 ENCOUNTER — Other Ambulatory Visit: Payer: Self-pay | Admitting: Family Medicine

## 2024-02-03 ENCOUNTER — Other Ambulatory Visit: Payer: Self-pay | Admitting: Family Medicine

## 2024-02-03 DIAGNOSIS — K219 Gastro-esophageal reflux disease without esophagitis: Secondary | ICD-10-CM

## 2024-02-22 DIAGNOSIS — R0981 Nasal congestion: Secondary | ICD-10-CM | POA: Diagnosis not present

## 2024-02-22 DIAGNOSIS — R438 Other disturbances of smell and taste: Secondary | ICD-10-CM | POA: Diagnosis not present

## 2024-02-22 DIAGNOSIS — J302 Other seasonal allergic rhinitis: Secondary | ICD-10-CM | POA: Diagnosis not present

## 2024-03-21 DIAGNOSIS — B356 Tinea cruris: Secondary | ICD-10-CM | POA: Diagnosis not present

## 2024-03-21 DIAGNOSIS — L820 Inflamed seborrheic keratosis: Secondary | ICD-10-CM | POA: Diagnosis not present

## 2024-03-21 DIAGNOSIS — B351 Tinea unguium: Secondary | ICD-10-CM | POA: Diagnosis not present

## 2024-03-22 ENCOUNTER — Encounter: Payer: Self-pay | Admitting: Family Medicine

## 2024-03-22 ENCOUNTER — Ambulatory Visit (INDEPENDENT_AMBULATORY_CARE_PROVIDER_SITE_OTHER): Admitting: Family Medicine

## 2024-03-22 VITALS — BP 149/69 | HR 69 | Temp 97.8°F | Ht 71.0 in | Wt 215.2 lb

## 2024-03-22 DIAGNOSIS — R351 Nocturia: Secondary | ICD-10-CM | POA: Diagnosis not present

## 2024-03-22 DIAGNOSIS — E7800 Pure hypercholesterolemia, unspecified: Secondary | ICD-10-CM | POA: Diagnosis not present

## 2024-03-22 DIAGNOSIS — K219 Gastro-esophageal reflux disease without esophagitis: Secondary | ICD-10-CM | POA: Diagnosis not present

## 2024-03-22 DIAGNOSIS — B351 Tinea unguium: Secondary | ICD-10-CM | POA: Diagnosis not present

## 2024-03-22 DIAGNOSIS — M150 Primary generalized (osteo)arthritis: Secondary | ICD-10-CM | POA: Diagnosis not present

## 2024-03-22 DIAGNOSIS — J302 Other seasonal allergic rhinitis: Secondary | ICD-10-CM

## 2024-03-22 DIAGNOSIS — R432 Parageusia: Secondary | ICD-10-CM | POA: Diagnosis not present

## 2024-03-22 DIAGNOSIS — E66811 Obesity, class 1: Secondary | ICD-10-CM | POA: Diagnosis not present

## 2024-03-22 MED ORDER — OMEPRAZOLE 20 MG PO CPDR: 20.0000 mg | DELAYED_RELEASE_CAPSULE | Freq: Every day | ORAL | 0 refills | Status: DC | PRN

## 2024-03-22 MED ORDER — CICLOPIROX 8 % EX SOLN: Freq: Every day | CUTANEOUS | 3 refills | Status: AC

## 2024-03-22 MED ORDER — DICLOFENAC SODIUM 75 MG PO TBEC: 75.0000 mg | DELAYED_RELEASE_TABLET | Freq: Two times a day (BID) | ORAL | 3 refills | Status: AC | PRN

## 2024-03-22 MED ORDER — EZETIMIBE 10 MG PO TABS
10.0000 mg | ORAL_TABLET | Freq: Every day | ORAL | 3 refills | Status: AC
Start: 2024-03-22 — End: ?

## 2024-03-22 MED ORDER — AZELASTINE HCL 0.1 % NA SOLN
1.0000 | Freq: Two times a day (BID) | NASAL | 12 refills | Status: DC | PRN
Start: 2024-03-22 — End: 2024-03-22

## 2024-03-22 NOTE — Patient Instructions (Signed)
Diverticulosis  Diverticulosis is when small pouches called diverticula form in the wall of the colon. The colon is where water is absorbed. It is also where poop (stool) is formed. The pouches form when the inside layer of the colon pushes through weak spots in the outer layers of the colon. You may have a few pouches or many of them. In most cases, the pouches do not cause problems. If they become inflamed or infected, you may have a condition called diverticulitis. What are the causes? The cause of this condition is not known. What increases the risk? You are more likely to get this condition if: You are older than 82 years of age. You do not eat enough fiber or you get constipated a lot. You are overweight. You do not get enough exercise. You smoke. You take over-the-counter pain medicines. You have a family history of the condition. What are the signs or symptoms? In most people, there are no symptoms. If you do have symptoms, they may include: Bloating. Stomach cramps. Constipation or diarrhea. Pain in the lower left side of your abdomen. How is this diagnosed? This condition is often diagnosed during an exam for other colon problems. It may be diagnosed when you have: A colonoscopy. This is when a tube with a camera on the end is used to look at your colon. A barium enema. This is an X-ray exam that uses dye to look at your colon. A CT scan. How is this treated? You may not need treatment. Your health care provider will tell you what you can do at home to help prevent problems. You may need treatment if you have symptoms or if you have had diverticulitis before. You may be told to: Eat a high-fiber diet. Take medicine to relax your colon. Lose weight. Follow these instructions at home: Medicines Take over-the-counter and prescription medicines only as told by your provider. If told, take a fiber supplement or probiotic. Managing constipation Your condition may cause  constipation. To prevent or treat constipation, you may need to: Drink enough fluid to keep your pee (urine) pale yellow. Take over-the-counter or prescription medicines. Eat foods that are high in fiber, such as beans, whole grains, and fresh fruits and vegetables. Limit foods that are high in fat and processed sugars, such as fried or sweet foods. Try not to strain when you poop. Contact a health care provider if: Your symptoms get worse all of a sudden. You have pain in your abdomen that gets worse. You have bloating or stomach cramps. You continue to have frequent constipation. You have a fever or chills. You vomit. Your poop is bloody, black, or tarry. This information is not intended to replace advice given to you by your health care provider. Make sure you discuss any questions you have with your health care provider. Document Revised: 04/17/2022 Document Reviewed: 04/17/2022 Elsevier Patient Education  2024 Elsevier Inc.  

## 2024-03-22 NOTE — Progress Notes (Signed)
 Jake Barnett is a 83 y.o. male presents to office today for annual physical exam examination.    Concerns today include: 1.  Nail fungus He has seen his specialist about this.  They recommended 9 months of Lamisil but he is really reluctant to do that due to potential impact on liver  2.  Right lower quadrant/hip pain He reports this is well relieved by Voltaren  but he wanted to double check as to how frequently he can utilize the medicine.  He does not want to harm his kidneys or liver.  Denies any abdominal pain, nausea, vomiting, GI bleeding.  Utilizing Prilosec daily as directed  3.  Abnormal taste Still having some weird smells and taste in his mouth that seem medicinal or chemical.  This was not relieved by use of sinus or allergy medications.  He is currently switching from Astelin  to [facility] which seems to work a little bit better for him but again still has not resolved this sensation  Occupation: Retired, Substance use: None Health Maintenance Due  Topic Date Due   Medicare Annual Wellness (AWV)  05/13/2022   COVID-19 Vaccine (6 - 2024-25 season) 10/25/2023   INFLUENZA VACCINE  03/04/2024   Colonoscopy  07/20/2024   Refills needed today: All  Immunization History  Administered Date(s) Administered   Fluad Quad(high Dose 65+) 05/26/2019, 06/05/2020   Influenza, High Dose Seasonal PF 05/27/2016, 06/09/2017, 05/19/2018, 05/23/2021, 05/21/2023   Influenza,inj,Quad PF,6+ Mos 06/20/2013, 05/22/2015, 05/26/2022   PFIZER(Purple Top)SARS-COV-2 Vaccination 08/25/2019, 09/15/2019, 04/28/2020, 12/24/2020   Pfizer(Comirnaty)Fall Seasonal Vaccine 12 years and older 04/27/2023   Pneumococcal Conjugate-13 07/06/2019   Pneumococcal Polysaccharide-23 07/16/2017   Respiratory Syncytial Virus Vaccine,Recomb Aduvanted(Arexvy) 06/03/2023   Tdap 07/06/2019   Zoster Recombinant(Shingrix ) 03/25/2021, 06/02/2022   Past Medical History:  Diagnosis Date   Arthritis    Cancer (HCC)    hx of  basal cell skin cancer    Cholelithiasis with chronic cholecystitis 12/04/2015   Chronic kidney disease    kidney stones   Difficult intubation 08/24/2015   GERD (gastroesophageal reflux disease)    History of kidney stones    Hyperlipidemia    Left ureteral calculus    Mild diastolic dysfunction    AYMPTOMATIC   Palpitations    occasional PAC/PVC.   PONV (postoperative nausea and vomiting)    Postoperative anemia due to acute blood loss 04/19/2013   RBBB    Social History   Socioeconomic History   Marital status: Married    Spouse name: Currie   Number of children: 1   Years of education: 13   Highest education level: Some college, no degree  Occupational History   Occupation: Landscape architect: [employer]    Comment: Retired   Occupation: Advertising account planner    Comment: for 13 years  Tobacco Use   Smoking status: Never   Smokeless tobacco: Never  Vaping Use   Vaping status: Never Used  Substance and Sexual Activity   Alcohol use: No   Drug use: No   Sexual activity: Not Currently  Other Topics Concern   Not on file  Social History Narrative   One level living with his wife. They have a basement accessible from outdoors.   Daughter lives in [place].    Social Drivers of Corporate investment banker Strain: Low Risk  (05/13/2021)   Overall Financial Resource Strain (CARDIA)    Difficulty of Paying Living Expenses: Not hard at all  Food Insecurity: No Food Insecurity (05/13/2021)  Hunger Vital Sign    Worried About Running Out of Food in the Last Year: Never true    Ran Out of Food in the Last Year: Never true  Transportation Needs: No Transportation Needs (05/13/2021)   PRAPARE - Administrator, Civil Service (Medical): No    Lack of Transportation (Non-Medical): No  Physical Activity: Insufficiently Active (05/13/2021)   Exercise Vital Sign    Days of Exercise per Week: 7 days    Minutes of Exercise per Session: 10 min  Stress: No Stress Concern  Present (05/13/2021)   [employer] of Occupational Health - Occupational Stress Questionnaire    Feeling of Stress : Only a little  Social Connections: Moderately Integrated (05/13/2021)   Social Connection and Isolation Panel    Frequency of Communication with Friends and Family: More than three times a week    Frequency of Social Gatherings with Friends and Family: Three times a week    Attends Religious Services: More than 4 times per year    Active Member of Clubs or Organizations: No    Attends Banker Meetings: Never    Marital Status: Married  Catering manager Violence: Not At Risk (05/13/2021)   Humiliation, Afraid, Rape, and Kick questionnaire    Fear of Current or Ex-Partner: No    Emotionally Abused: No    Physically Abused: No    Sexually Abused: No   Past Surgical History:  Procedure Laterality Date   ANTERIOR CERVICAL DECOMP/DISCECTOMY FUSION  2001   C5  -- C7- some limited hyperflexion of neck   CARDIOVASCULAR STRESS TEST  02-11-2011   DR CRENSHAW   NORMAL / NO ISCHEMIA/ EF 66%   CHOLECYSTECTOMY N/A 12/04/2015   Procedure: LAPAROSCOPIC CHOLECYSTECTOMY;  Surgeon: Elon Pacini, MD;  Location: [facility];  Service: General;  Laterality: N/A;   CYSTO/ BILATERAL LASER LITHOTRIPSY WITH STONE EXTRACTIONS  05-26-2011   CYSTOSCOPY W/ URETERAL STENT PLACEMENT Left 05/13/2013   Procedure: CYSTOSCOPY WITH RETROGRADE PYELOGRAM/URETERAL STENT PLACEMENT;  Surgeon: Alm GORMAN Fragmin, MD;  Location: [facility];  Service: Urology;  Laterality: Left;   CYSTOSCOPY WITH RETROGRADE PYELOGRAM, URETEROSCOPY AND STENT PLACEMENT Left 06/15/2013   Procedure: STENT REMOVAL/CYSTOSCOPY//URETEROSCOPYSTONE EXTRACTION WITH BASKET;  Surgeon: Alm GORMAN Fragmin, MD;  Location: [facility];  Service: Urology;  Laterality: Left;   EXTRACORPOREAL SHOCK WAVE LITHOTRIPSY Right 01/19/2023   Procedure: RIGHT EXTRACORPOREAL SHOCK WAVE LITHOTRIPSY (ESWL);  Surgeon: Lovie Arlyss CROME, MD;   Location[facility];  Service: Urology;  Laterality: Right;  75 MINUTES   HOLMIUM LASER APPLICATION Left 06/15/2013   Procedure: HOLMIUM LASER APPLICATION;  Surgeon: Alm GORMAN Fragmin, MD;  Location: [facility];  Service: Urology;  Laterality: Left;   INGUINAL HERNIA REPAIR Bilateral 08/24/2015   Procedure: LAPAROSCOPIC BILATERAL INGUINAL HERNIA REPAIR ;  Surgeon: Krystal Russell, MD;  Location: [facility];  Service: General;  Laterality: Bilateral;   INSERTION OF MESH Bilateral 08/24/2015   Procedure: WITH INSERTION OF MESH;  Surgeon: Krystal Russell, MD;  Location: [facility];  Service: General;  Laterality: Bilateral;   KNEE ARTHROSCOPY Right 2008   SHOULDER ARTHROSCOPY Right 2007   TOTAL KNEE ARTHROPLASTY Left 04/18/2013   Procedure: LEFT TOTAL KNEE ARTHROPLASTY;  Surgeon: Dempsey LULLA Moan, MD;  Location: [facility];  Service: Orthopedics;  Laterality: Left;   TRANSTHORACIC ECHOCARDIOGRAM  08-07-2008   NORMAL LVF/  EF 55-60%   TYMPANOPLASTY Right 1971   mild hearing loss   Family History  Problem Relation Age of  Onset   Heart failure Mother    Aortic stenosis Mother    Heart disease Mother    Hypertension Father    Dementia Father    Arthritis Sister    Hypertension Sister    Healthy Daughter    Arthritis Daughter     Current Outpatient Medications:    azelastine  (ASTELIN ) 0.1 % nasal spray, Place 1 spray into both nostrils 2 (two) times daily as needed for allergies (runny nose)., Disp: 30 mL, Rfl: 12   Cholecalciferol (DIALYVITE VITAMIN D 5000) 125 MCG (5000 UT) capsule, Take 5,000 Units by mouth daily., Disp: , Rfl:    diclofenac  (VOLTAREN ) 75 MG EC tablet, TAKE 1 TABLET (75 MG TOTAL) BY MOUTH 2 (TWO) TIMES DAILY. FOR MUSCLE AND JOINT PAIN, Disp: 180 tablet, Rfl: 0   docusate sodium  (STOOL SOFTENER) 100 MG capsule, as needed., Disp: , Rfl:    ezetimibe  (ZETIA ) 10 MG tablet, TAKE 1 TABLET BY MOUTH EVERY DAY, Disp: 90 tablet, Rfl: 3   hydrocortisone  (ANUSOL -HC) 2.5 % rectal cream,  Place 1 Application rectally 2 (two) times daily., Disp: 30 g, Rfl: 0   omeprazole  (PRILOSEC) 20 MG capsule, TAKE 1 CAPSULE BY MOUTH DAILY AS NEEDED., Disp: 90 capsule, Rfl: 0  Allergies  Allergen Reactions   Indomethacin Nausea Only and Other (See Comments)    Stomach ache   Indomethacin    Statins Other (See Comments)    Muscle pain, fatigue and dizziness     ROS: Review of Systems Positive as above in HPI but also positive for nocturia.  No hematuria, dysuria or scrotal concerns    Physical exam BP (!) 149/69   Pulse 69   Temp 97.8 F (36.6 C)   Ht 5' 11 (1.803 m)   Wt 215 lb 4 oz (97.6 kg)   SpO2 95%   BMI 30.02 kg/m  General appearance: alert, cooperative, appears stated age, no distress, and mildly obese Head: Normocephalic, without obvious abnormality, atraumatic Eyes: Sclera white.  PERRLA.  EOMI Ears: normal TM's and external ear canals both ears Nose: Nares normal. Septum midline. Mucosa normal. No drainage or sinus tenderness. Throat: lips, mucosa, and tongue normal; teeth and gums normal Neck: no adenopathy, no carotid bruit, supple, symmetrical, trachea midline, and thyroid  not enlarged, symmetric, no tenderness/mass/nodules Back: Has very little range of motion of the C-spine.  No midline tenderness palpation to the back.  No other palpable abnormalities Lungs: clear to auscultation bilaterally Chest wall: no tenderness Heart: regular rate and rhythm, S1, S2 normal, no murmur, click, rub or gallop Abdomen: soft, non-tender; bowel sounds normal; no masses,  no organomegaly Extremities: extremities normal, atraumatic, no cyanosis or edema Pulses: 2+ and symmetric Skin: Multiple areas of hyperpigmentation and post treatment inflammation on the face Lymph nodes: Cervical, supraclavicular, and axillary nodes normal. Neurologic: Grossly normal      03/22/2024    8:33 AM 11/18/2023   10:25 AM 10/12/2023    9:59 AM  Depression screen PHQ 2/9  Decreased Interest 0  0 0  Down, Depressed, Hopeless 0 0 0  PHQ - 2 Score 0 0 0  Altered sleeping 0  0  Tired, decreased energy 0  0  Change in appetite 0  0  Feeling bad or failure about yourself  0  0  Trouble concentrating 0  0  Moving slowly or fidgety/restless 0  0  Suicidal thoughts 0  0  PHQ-9 Score 0  0  Difficult doing work/chores Not difficult at all  Not difficult at all      03/22/2024    8:33 AM 10/12/2023   10:00 AM 01/26/2023   10:19 AM 10/07/2022   10:57 AM  GAD 7 : Generalized Anxiety Score  Nervous, Anxious, on Edge 0 0 0 1  Control/stop worrying 0 0 0 0  Worry too much - different things 0 0 0 0  Trouble relaxing 0 0 0 0  Restless 0 0 0 0  Easily annoyed or irritable 0 0 0 0  Afraid - awful might happen 0 0 0 0  Total GAD 7 Score 0 0 0 1  Anxiety Difficulty Not difficult at all Not difficult at all  Not difficult at all     Assessment/ Plan: Jake Barnett here for annual physical exam.   Pure hypercholesterolemia - Plan: ezetimibe  (ZETIA ) 10 MG tablet, Lipid Panel, CMP14+EGFR, TSH  Gastroesophageal reflux disease without esophagitis - Plan: omeprazole  (PRILOSEC) 20 MG capsule, CMP14+EGFR, CBC with Differential  Nocturia - Plan: PSA  Seasonal allergic rhinitis, unspecified trigger - Plan: CMP14+EGFR, CBC with Differential, DISCONTINUED: azelastine  (ASTELIN ) 0.1 % nasal spray  Primary osteoarthritis involving multiple joints - Plan: diclofenac  (VOLTAREN ) 75 MG EC tablet, CMP14+EGFR, CBC with Differential  Obesity (BMI 30.0-34.9) - Plan: Lipid Panel, CMP14+EGFR, TSH  Onychomycosis - Plan: ciclopirox  (PENLAC ) 8 % solution  Abnormal taste in mouth  Fasting labs collected.  I renewed his Zetia .  Will CC cholesterol levels to cardiology once available  PPI working well.  Check CBC given chronic use  Check PSA given nocturia  Astelin  discontinued.  Continue Flonase as needed  Voltaren  renewed for as needed use.  Check renal function.  No red flag signs or symptoms  Penlac   solution ordered for onychomycosis  Uncertain etiology of the abnormal taste and smell that he has been experiencing.  His knowledge she has never had COVID.  Should he desire I am glad to place referral to ENT  Counseled on healthy lifestyle choices, including diet (rich in fruits, vegetables and lean meats and low in salt and simple carbohydrates) and exercise (at least 30 minutes of moderate physical activity daily).  Patient to follow up 1 year for CPE  Angelo Prindle M. Jolinda, DO

## 2024-03-23 ENCOUNTER — Ambulatory Visit: Payer: Self-pay | Admitting: Family Medicine

## 2024-03-23 LAB — PSA: Prostate Specific Ag, Serum: 2.6 ng/mL (ref 0.0–4.0)

## 2024-03-23 LAB — CBC WITH DIFFERENTIAL/PLATELET
Basophils Absolute: 0 x10E3/uL (ref 0.0–0.2)
Basos: 0 %
EOS (ABSOLUTE): 0.1 x10E3/uL (ref 0.0–0.4)
Eos: 2 %
Hematocrit: 47 % (ref 37.5–51.0)
Hemoglobin: 15.1 g/dL (ref 13.0–17.7)
Immature Grans (Abs): 0 x10E3/uL (ref 0.0–0.1)
Immature Granulocytes: 0 %
Lymphocytes Absolute: 1.4 x10E3/uL (ref 0.7–3.1)
Lymphs: 21 %
MCH: 29.2 pg (ref 26.6–33.0)
MCHC: 32.1 g/dL (ref 31.5–35.7)
MCV: 91 fL (ref 79–97)
Monocytes Absolute: 0.6 x10E3/uL (ref 0.1–0.9)
Monocytes: 9 %
Neutrophils Absolute: 4.4 x10E3/uL (ref 1.4–7.0)
Neutrophils: 68 %
Platelets: 232 x10E3/uL (ref 150–450)
RBC: 5.17 x10E6/uL (ref 4.14–5.80)
RDW: 13.6 % (ref 11.6–15.4)
WBC: 6.5 x10E3/uL (ref 3.4–10.8)

## 2024-03-23 LAB — CMP14+EGFR
ALT: 10 IU/L (ref 0–44)
AST: 18 IU/L (ref 0–40)
Albumin: 4.2 g/dL (ref 3.7–4.7)
Alkaline Phosphatase: 110 IU/L (ref 44–121)
BUN/Creatinine Ratio: 16 (ref 10–24)
BUN: 16 mg/dL (ref 8–27)
Bilirubin Total: 1.4 mg/dL — ABNORMAL HIGH (ref 0.0–1.2)
CO2: 22 mmol/L (ref 20–29)
Calcium: 8.9 mg/dL (ref 8.6–10.2)
Chloride: 103 mmol/L (ref 96–106)
Creatinine, Ser: 0.98 mg/dL (ref 0.76–1.27)
Globulin, Total: 2.4 g/dL (ref 1.5–4.5)
Glucose: 92 mg/dL (ref 70–99)
Potassium: 4.3 mmol/L (ref 3.5–5.2)
Sodium: 141 mmol/L (ref 134–144)
Total Protein: 6.6 g/dL (ref 6.0–8.5)
eGFR: 77 mL/min/1.73 (ref >59)

## 2024-03-23 LAB — LIPID PANEL
Chol/HDL Ratio: 3.5 ratio (ref 0.0–5.0)
Cholesterol, Total: 163 mg/dL (ref 100–199)
HDL: 46 mg/dL (ref >39)
LDL Chol Calc (NIH): 97 mg/dL (ref 0–99)
Triglycerides: 107 mg/dL (ref 0–149)
VLDL Cholesterol Cal: 20 mg/dL (ref 5–40)

## 2024-03-23 LAB — TSH: TSH: 1.54 u[IU]/mL (ref 0.450–4.500)

## 2024-05-24 DIAGNOSIS — J019 Acute sinusitis, unspecified: Secondary | ICD-10-CM | POA: Diagnosis not present

## 2024-05-24 DIAGNOSIS — R438 Other disturbances of smell and taste: Secondary | ICD-10-CM | POA: Diagnosis not present

## 2024-06-02 DIAGNOSIS — H02834 Dermatochalasis of left upper eyelid: Secondary | ICD-10-CM | POA: Diagnosis not present

## 2024-06-02 DIAGNOSIS — H1045 Other chronic allergic conjunctivitis: Secondary | ICD-10-CM | POA: Diagnosis not present

## 2024-06-02 DIAGNOSIS — H0288B Meibomian gland dysfunction left eye, upper and lower eyelids: Secondary | ICD-10-CM | POA: Diagnosis not present

## 2024-06-02 DIAGNOSIS — H25813 Combined forms of age-related cataract, bilateral: Secondary | ICD-10-CM | POA: Diagnosis not present

## 2024-06-02 DIAGNOSIS — H0288A Meibomian gland dysfunction right eye, upper and lower eyelids: Secondary | ICD-10-CM | POA: Diagnosis not present

## 2024-06-02 DIAGNOSIS — H02831 Dermatochalasis of right upper eyelid: Secondary | ICD-10-CM | POA: Diagnosis not present

## 2024-06-02 DIAGNOSIS — H43811 Vitreous degeneration, right eye: Secondary | ICD-10-CM | POA: Diagnosis not present

## 2024-06-02 DIAGNOSIS — H04123 Dry eye syndrome of bilateral lacrimal glands: Secondary | ICD-10-CM | POA: Diagnosis not present

## 2024-06-09 DIAGNOSIS — R431 Parosmia: Secondary | ICD-10-CM | POA: Diagnosis not present

## 2024-06-10 DIAGNOSIS — Z23 Encounter for immunization: Secondary | ICD-10-CM | POA: Diagnosis not present

## 2024-06-17 DIAGNOSIS — R431 Parosmia: Secondary | ICD-10-CM | POA: Diagnosis not present

## 2024-06-21 DIAGNOSIS — N430 Encysted hydrocele: Secondary | ICD-10-CM | POA: Diagnosis not present

## 2024-06-21 DIAGNOSIS — N200 Calculus of kidney: Secondary | ICD-10-CM | POA: Diagnosis not present

## 2024-06-21 DIAGNOSIS — R351 Nocturia: Secondary | ICD-10-CM | POA: Diagnosis not present

## 2024-06-21 DIAGNOSIS — N401 Enlarged prostate with lower urinary tract symptoms: Secondary | ICD-10-CM | POA: Diagnosis not present

## 2024-07-04 DIAGNOSIS — H0288B Meibomian gland dysfunction left eye, upper and lower eyelids: Secondary | ICD-10-CM | POA: Diagnosis not present

## 2024-07-04 DIAGNOSIS — H25813 Combined forms of age-related cataract, bilateral: Secondary | ICD-10-CM | POA: Diagnosis not present

## 2024-07-04 DIAGNOSIS — H02834 Dermatochalasis of left upper eyelid: Secondary | ICD-10-CM | POA: Diagnosis not present

## 2024-07-04 DIAGNOSIS — H04123 Dry eye syndrome of bilateral lacrimal glands: Secondary | ICD-10-CM | POA: Diagnosis not present

## 2024-07-04 DIAGNOSIS — H43811 Vitreous degeneration, right eye: Secondary | ICD-10-CM | POA: Diagnosis not present

## 2024-07-04 DIAGNOSIS — H1045 Other chronic allergic conjunctivitis: Secondary | ICD-10-CM | POA: Diagnosis not present

## 2024-07-04 DIAGNOSIS — H02831 Dermatochalasis of right upper eyelid: Secondary | ICD-10-CM | POA: Diagnosis not present

## 2024-07-04 DIAGNOSIS — H0288A Meibomian gland dysfunction right eye, upper and lower eyelids: Secondary | ICD-10-CM | POA: Diagnosis not present

## 2024-07-24 ENCOUNTER — Other Ambulatory Visit: Payer: Self-pay | Admitting: Family Medicine

## 2024-07-24 DIAGNOSIS — K219 Gastro-esophageal reflux disease without esophagitis: Secondary | ICD-10-CM

## 2024-08-29 ENCOUNTER — Ambulatory Visit: Admitting: Physical Therapy

## 2024-09-06 ENCOUNTER — Ambulatory Visit: Admitting: Physical Therapy

## 2024-09-06 ENCOUNTER — Other Ambulatory Visit: Payer: Self-pay

## 2024-09-06 DIAGNOSIS — M25552 Pain in left hip: Secondary | ICD-10-CM

## 2024-09-06 DIAGNOSIS — R2689 Other abnormalities of gait and mobility: Secondary | ICD-10-CM

## 2024-09-06 DIAGNOSIS — M25562 Pain in left knee: Secondary | ICD-10-CM

## 2024-09-06 DIAGNOSIS — M25652 Stiffness of left hip, not elsewhere classified: Secondary | ICD-10-CM

## 2024-09-08 ENCOUNTER — Ambulatory Visit: Admitting: *Deleted

## 2024-09-08 DIAGNOSIS — M25652 Stiffness of left hip, not elsewhere classified: Secondary | ICD-10-CM

## 2024-09-08 DIAGNOSIS — M25552 Pain in left hip: Secondary | ICD-10-CM

## 2024-09-08 DIAGNOSIS — M25562 Pain in left knee: Secondary | ICD-10-CM

## 2024-09-08 NOTE — Therapy (Signed)
 " OUTPATIENT PHYSICAL THERAPY LOWER EXTREMITY TREATMENT   Patient Name: NICKOLIS DIEL MRN: [id number] DOB:[birth year], 84 y.o., male Today's Date: 09/08/2024  END OF SESSION:  PT End of Session - 09/08/24 1356     Visit Number 2    Number of Visits 12    Date for Recertification  10/18/24    Authorization Type Medicare    PT Start Time 1355    PT Stop Time 1443    PT Time Calculation (min) 48 min          Past Medical History:  Diagnosis Date   Arthritis    Cancer (HCC)    hx of basal cell skin cancer    Cholelithiasis with chronic cholecystitis 12/04/2015   Chronic kidney disease    kidney stones   Difficult intubation 08/24/2015   GERD (gastroesophageal reflux disease)    History of kidney stones    Hyperlipidemia    Left ureteral calculus    Mild diastolic dysfunction    AYMPTOMATIC   Palpitations    occasional PAC/PVC.   PONV (postoperative nausea and vomiting)    Postoperative anemia due to acute blood loss 04/19/2013   RBBB    Past Surgical History:  Procedure Laterality Date   ANTERIOR CERVICAL DECOMP/DISCECTOMY FUSION  2001   C5  -- C7- some limited hyperflexion of neck   CARDIOVASCULAR STRESS TEST  02-11-2011   DR CRENSHAW   NORMAL / NO ISCHEMIA/ EF 66%   CHOLECYSTECTOMY N/A 12/04/2015   Procedure: LAPAROSCOPIC CHOLECYSTECTOMY;  Surgeon: Elon Pacini, MD;  Location: [facility];  Service: General;  Laterality: N/A;   CYSTO/ BILATERAL LASER LITHOTRIPSY WITH STONE EXTRACTIONS  05-26-2011   CYSTOSCOPY W/ URETERAL STENT PLACEMENT Left 05/13/2013   Procedure: CYSTOSCOPY WITH RETROGRADE PYELOGRAM/URETERAL STENT PLACEMENT;  Surgeon: Alm GORMAN Fragmin, MD;  Location: [facility];  Service: Urology;  Laterality: Left;   CYSTOSCOPY WITH RETROGRADE PYELOGRAM, URETEROSCOPY AND STENT PLACEMENT Left 06/15/2013   Procedure: STENT REMOVAL/CYSTOSCOPY//URETEROSCOPYSTONE EXTRACTION WITH BASKET;  Surgeon: Alm GORMAN Fragmin, MD;  Location: [facility];  Service: Urology;   Laterality: Left;   EXTRACORPOREAL SHOCK WAVE LITHOTRIPSY Right 01/19/2023   Procedure: RIGHT EXTRACORPOREAL SHOCK WAVE LITHOTRIPSY (ESWL);  Surgeon: Lovie Arlyss CROME, MD;  Location: [facility];  Service: Urology;  Laterality: Right;  75 MINUTES   HOLMIUM LASER APPLICATION Left 06/15/2013   Procedure: HOLMIUM LASER APPLICATION;  Surgeon: Alm GORMAN Fragmin, MD;  Location: [facility];  Service: Urology;  Laterality: Left;   INGUINAL HERNIA REPAIR Bilateral 08/24/2015   Procedure: LAPAROSCOPIC BILATERAL INGUINAL HERNIA REPAIR ;  Surgeon: Krystal Russell, MD;  Location: [facility];  Service: General;  Laterality: Bilateral;   INSERTION OF MESH Bilateral 08/24/2015   Procedure: WITH INSERTION OF MESH;  Surgeon: Krystal Russell, MD;  Location: [facility];  Service: General;  Laterality: Bilateral;   KNEE ARTHROSCOPY Right 2008   SHOULDER ARTHROSCOPY Right 2007   TOTAL KNEE ARTHROPLASTY Left 04/18/2013   Procedure: LEFT TOTAL KNEE ARTHROPLASTY;  Surgeon: Dempsey LULLA Moan, MD;  Location: [facility];  Service: Orthopedics;  Laterality: Left;   TRANSTHORACIC ECHOCARDIOGRAM  08-07-2008   NORMAL LVF/  EF 55-60%   TYMPANOPLASTY Right 1971   mild hearing loss   Patient Active Problem List   Diagnosis Date Noted   History of renal calculi 01/24/2020   Cholelithiasis with chronic cholecystitis 12/04/2015   Ureteral calculus 05/13/2013   OA (osteoarthritis) of knee 04/18/2013   Diastolic dysfunction 12/16/2010   Hyperlipidemia 12/16/2010  GERD (gastroesophageal reflux disease)    Palpitations     PCP: Jolinda Norene HERO, DO  REFERRING PROVIDER: Jolinda Norene HERO, DO  REFERRING DIAG: [phone] Presence of left artificial knee joint  THERAPY DIAG:  Pain in left hip  Acute pain of left knee  Stiffness of left hip, not elsewhere classified  Rationale for Evaluation and Treatment: Rehabilitation  ONSET DATE: ~3 months ago gradual onset  SUBJECTIVE:   SUBJECTIVE STATEMENT: Pt reports he had knee  replacement 10 years ago and had no issues. Gradually started hurting ~3 months ago. Now unable to lay in bed but can sit in a chair (has to sleep in his chair). Did good after eval   PERTINENT HISTORY: Knee, shoulder, neck, hernia, gall bladder and kidney stone surgery PAIN:  Are you having pain? Yes: NPRS scale: 1 or 2/10 currently, 8 or 9/10 at worst Pain location: Anterior knee Pain description: Aching, throbbing, sharp, can radiate in upper leg and groin  Aggravating factors: in bed, bending/squatting, standing long periods (just brushing teeth) Relieving factors: sit in chair, heating pad  PRECAUTIONS: None  RED FLAGS: None   WEIGHT BEARING RESTRICTIONS: No  FALLS:  Has patient fallen in last 6 months? No  LIVING ENVIRONMENT: Lives with: lives with their spouse Lives in: House/apartment Stairs:  8 steps into basement (this will bother him some) Has following equipment at home: None  OCCUPATION: Retired  PLOF: Independent; wife is currently having to help don/doff socks/shoes/pants due to limited bending  PATIENT GOALS: Decrease pain, less difficulty with work activities, sleep longer, stand longer  NEXT MD VISIT: As needed  OBJECTIVE:  Note: Objective measures were completed at Evaluation unless otherwise noted.  DIAGNOSTIC FINDINGS: No imaging seen on Epic for pt's knee  Pelvis MRI 11/19/23 IMPRESSION: 1. Osteoarthritis of both hips. 2. Partial fusion of the right SI joint and spurring of the left SI joint. This is chronic and unchanged from 2018. 3. Facet encroachment at L5-S1 without high-grade impingement. 4. Lipoma or similar benign lesion along the anterior urinary bladder wall. Similar appearance 05/01/2017. 5. Anterior pelvic hernia mesh. 6. Sigmoid colon diverticulosis.  PATIENT SURVEYS:  LEFS  Extreme difficulty/unable (0), Quite a bit of difficulty (1), Moderate difficulty (2), Little difficulty (3), No difficulty (4) Survey date:  09/06/24  Any of  your usual work, housework or school activities 1  2. Usual hobbies, recreational or sporting activities 1  3. Getting into/out of the bath 2  4. Walking between rooms 3  5. Putting on socks/shoes 0  6. Squatting  0  7. Lifting an object, like a bag of groceries from the floor 2  8. Performing light activities around your home 2  9. Performing heavy activities around your home 1  10. Getting into/out of a car 1  11. Walking 2 blocks 1  12. Walking 1 mile 0  13. Going up/down 10 stairs (1 flight) 2  14. Standing for 1 hour 0  15.  sitting for 1 hour 3  16. Running on even ground 0  17. Running on uneven ground 0  18. Making sharp turns while running fast 0  19. Hopping  1  20. Rolling over in bed 1  Score total:  21     COGNITION: Overall cognitive status: Within functional limits for tasks assessed     SENSATION: WFL  EDEMA:  None  MUSCLE LENGTH: Hamstrings: Right >80 deg; Left >60 deg but can feel it in groin Thomas test: Unable to tolerate  L LE fully extended in bed  POSTURE: in anterior pelvic tilt in standing with reduced lumbar lordosis, trunk and knees slightly flexed in standing  PALPATION: No over tenderness to palpation  LUMBAR ROM:   Active  A/PROM  eval  Flexion 50% pain in groin  Extension 0%  Right lateral flexion 80%  Left lateral flexion 50%  Right rotation 80%  Left rotation 50% pain in groin   (Blank rows = not tested)   LOWER EXTREMITY MMT:  MMT Right eval Left eval  Hip flexion 5 4  Hip extension 5 5  Hip abduction 5 4  Hip adduction  5  Hip internal rotation 5 4  Hip external rotation 5 4  Knee flexion 5 5  Knee extension 5 5  Ankle dorsiflexion    Ankle plantarflexion    Ankle inversion    Ankle eversion     (Blank rows = not tested)  LOWER EXTREMITY ROM: all L hip PROM limited compared to R  PROM Right eval Left eval  Hip flexion supine >100 ~90  Hip extension    Hip abduction    Hip adduction    Hip internal  rotation    Hip external rotation    Knee flexion full full  Knee extension full full  Ankle dorsiflexion    Ankle plantarflexion    Ankle inversion    Ankle eversion     (Blank rows = not tested)  LOWER EXTREMITY SPECIAL TESTS:  Hip special tests: Belvie (FABER) test: positive , Thomas test: positive , Craig's test: positive , and Anterior hip impingement test: positive   FUNCTIONAL TESTS:  Did not assess  GAIT: Distance walked: Into clinic Assistive device utilized: None Level of assistance: Complete Independence Comments: Normal reciprocal gait pattern                                                                                                                                TREATMENT DATE: 09/08/24                                    EXERCISE LOG       LT knee and hip  Exercise Repetitions and Resistance Comments  Nustep X 10  mins L3   Rocker board X 4 mins PF/DF,  balance    LAQs  4#s  2x10 pause 5 at top            Blank cell = exercise not performed today  Reviewed and discussed HEP Manual   STW to quads and ITB as well as patella mobs    PATIENT EDUCATION:  Education details: Exam findings, POC, initial HEP Person educated: Patient Education method: Explanation, Demonstration, and Handouts Education comprehension: verbalized understanding, returned demonstration, and needs further education  HOME EXERCISE PROGRAM: Access Code: [email or web address] URL: [email or web address] Date: 09/06/2024 Prepared by: Gellen April Earnie  Nonato  Exercises - Supine Heel Slide with Strap  - 2-3 x daily - 7 x weekly - 1 sets - 10 reps - 3 sec hold - Bent Knee Fallouts  - 2-3 x daily - 7 x weekly - 1 sets - 10 reps - 3 sec hold - Supine Hip Internal and External Rotation  - 2-3 x daily - 7 x weekly - 1 sets - 10 reps - 3 sec hold - Standing Lumbar Extension with Counter  - 2-3 x daily - 7 x weekly - 1 sets - 10 reps - 3 sec hold  ASSESSMENT:  CLINICAL  IMPRESSION: Patient  arrived today doing fair with LT hip and knee. Rx focused on therex for LE strengthening sitting and standing  as well as balance Activities and manual STW to quads and LT ITB. Notable patella tightness and ITB tightness/ soreness.     OBJECTIVE IMPAIRMENTS: decreased activity tolerance, decreased coordination, decreased endurance, decreased mobility, decreased ROM, decreased strength, hypomobility, impaired flexibility, improper body mechanics, postural dysfunction, and pain.   ACTIVITY LIMITATIONS: bending, standing, squatting, sleeping, stairs, transfers, bathing, toileting, dressing, hygiene/grooming, locomotion level, and caring for others  PARTICIPATION LIMITATIONS: cleaning, community activity, and yard work  PERSONAL FACTORS: Age, Fitness, Past/current experiences, and Time since onset of injury/illness/exacerbation are also affecting patient's functional outcome.   REHAB POTENTIAL: Good  CLINICAL DECISION MAKING: Evolving/moderate complexity  EVALUATION COMPLEXITY: Moderate   GOALS: Goals reviewed with patient? Yes  SHORT TERM GOALS: Target date: 09/27/2024  Pt will be ind with initial HEP Baseline: Goal status: INITIAL  2.  Pt will have improved hip flexion PROM to >/=100 deg without groin pain Baseline:  Goal status: INITIAL    LONG TERM GOALS: Target date: 10/18/2024   Pt will be ind with management and progression of HEP Baseline:  Goal status: INITIAL  2.  Pt will be able to fully extend LEs to lay flat in bed for improved sleep without pain/discomfort Baseline:  Goal status: INITIAL  3.  Pt will report reduced pain by >/=75% Baseline:  Goal status: INITIAL  4.  Pt will be able to bend over for lower body dressing Baseline:  Goal status: INITIAL  5.  Pt will have improved LEFS to >/=33 to demo MCID Baseline:  Goal status: INITIAL    PLAN:  PT FREQUENCY: 2x/week  PT DURATION: 6 weeks  PLANNED INTERVENTIONS: [id number]- PT  Re-evaluation, 97750- Physical Performance Testing, 97110-Therapeutic exercises, 97530- Therapeutic activity, [id number]- Neuromuscular re-education, 97535- Self Care, [id number]- Manual therapy, [id number]- Electrical stimulation (unattended), [id number]- Ultrasound, 02966- Ionotophoresis 4mg /ml Dexamethasone , 79439 (1-2 muscles), 20561 (3+ muscles)- Dry Needling, Patient/Family education, Taping, Joint mobilization, Spinal mobilization, Cryotherapy, and Moist heat  PLAN FOR NEXT SESSION: Assess response to HEP. Continue to work on gentle hip mobility in all directions. Hip mobilizations as tolerated. Stretch quads/hip flexors.    Trino Higinbotham,CHRIS, PTA, DPT 09/08/2024, 2:47 PM  "

## 2024-09-12 ENCOUNTER — Ambulatory Visit: Admitting: Physical Therapy

## 2024-09-15 ENCOUNTER — Ambulatory Visit: Admitting: Physical Therapy
